# Patient Record
Sex: Female | Born: 1945 | Race: White | Hispanic: No | Marital: Married | Smoking: Former smoker
Health system: Southern US, Community
[De-identification: ages and names within clinical notes are randomized; demographics above are authoritative.]

## PROBLEM LIST (undated history)

## (undated) DIAGNOSIS — Z950 Presence of cardiac pacemaker: Secondary | ICD-10-CM

## (undated) DIAGNOSIS — I495 Sick sinus syndrome: Secondary | ICD-10-CM

## (undated) DIAGNOSIS — C50919 Malignant neoplasm of unspecified site of unspecified female breast: Secondary | ICD-10-CM

## (undated) DIAGNOSIS — I499 Cardiac arrhythmia, unspecified: Secondary | ICD-10-CM

## (undated) DIAGNOSIS — J849 Interstitial pulmonary disease, unspecified: Secondary | ICD-10-CM

## (undated) DIAGNOSIS — C801 Malignant (primary) neoplasm, unspecified: Secondary | ICD-10-CM

## (undated) DIAGNOSIS — Z973 Presence of spectacles and contact lenses: Secondary | ICD-10-CM

## (undated) DIAGNOSIS — I519 Heart disease, unspecified: Secondary | ICD-10-CM

## (undated) DIAGNOSIS — H269 Unspecified cataract: Secondary | ICD-10-CM

## (undated) DIAGNOSIS — J45909 Unspecified asthma, uncomplicated: Secondary | ICD-10-CM

## (undated) DIAGNOSIS — I10 Essential (primary) hypertension: Secondary | ICD-10-CM

## (undated) DIAGNOSIS — M109 Gout, unspecified: Secondary | ICD-10-CM

## (undated) DIAGNOSIS — I493 Ventricular premature depolarization: Secondary | ICD-10-CM

## (undated) DIAGNOSIS — D649 Anemia, unspecified: Secondary | ICD-10-CM

## (undated) DIAGNOSIS — K219 Gastro-esophageal reflux disease without esophagitis: Secondary | ICD-10-CM

## (undated) DIAGNOSIS — Z789 Other specified health status: Secondary | ICD-10-CM

## (undated) DIAGNOSIS — J189 Pneumonia, unspecified organism: Secondary | ICD-10-CM

## (undated) DIAGNOSIS — M1990 Unspecified osteoarthritis, unspecified site: Secondary | ICD-10-CM

## (undated) DIAGNOSIS — M8580 Other specified disorders of bone density and structure, unspecified site: Secondary | ICD-10-CM

## (undated) HISTORY — DX: Other specified health status: Z78.9

## (undated) HISTORY — DX: Other specified disorders of bone density and structure, unspecified site: M85.80

## (undated) HISTORY — DX: Ventricular premature depolarization: I49.3

## (undated) HISTORY — DX: Heart disease, unspecified: I51.9

## (undated) HISTORY — PX: TUBAL LIGATION: SHX77

## (undated) HISTORY — PX: WISDOM TOOTH EXTRACTION: SHX21

## (undated) HISTORY — PX: EYE SURGERY: SHX253

## (undated) HISTORY — DX: Pneumonia, unspecified organism: J18.9

## (undated) HISTORY — PX: PACEMAKER INSERTION: SHX728

## (undated) HISTORY — PX: HYSTEROSCOPY: SHX211

## (undated) HISTORY — DX: Interstitial pulmonary disease, unspecified: J84.9

## (undated) HISTORY — DX: Sick sinus syndrome: I49.5

## (undated) HISTORY — PX: VAGINAL HYSTERECTOMY: SUR661

## (undated) HISTORY — DX: Essential (primary) hypertension: I10

## (undated) HISTORY — DX: Presence of cardiac pacemaker: Z95.0

## (undated) HISTORY — PX: CORONARY ANGIOPLASTY: SHX604

## (undated) HISTORY — PX: DILATION AND CURETTAGE OF UTERUS: SHX78

## (undated) HISTORY — PX: GALLBLADDER SURGERY: SHX652

## (undated) HISTORY — PX: TONSILLECTOMY: SUR1361

---

## 1998-08-08 ENCOUNTER — Other Ambulatory Visit: Admission: RE | Admit: 1998-08-08 | Discharge: 1998-08-08 | Payer: Self-pay | Admitting: Obstetrics and Gynecology

## 1999-10-02 ENCOUNTER — Other Ambulatory Visit: Admission: RE | Admit: 1999-10-02 | Discharge: 1999-10-02 | Payer: Self-pay | Admitting: Obstetrics and Gynecology

## 2000-10-01 ENCOUNTER — Other Ambulatory Visit: Admission: RE | Admit: 2000-10-01 | Discharge: 2000-10-01 | Payer: Self-pay | Admitting: Obstetrics and Gynecology

## 2001-04-22 ENCOUNTER — Encounter: Payer: Self-pay | Admitting: Emergency Medicine

## 2001-04-22 ENCOUNTER — Inpatient Hospital Stay (HOSPITAL_COMMUNITY): Admission: EM | Admit: 2001-04-22 | Discharge: 2001-04-26 | Payer: Self-pay | Admitting: Emergency Medicine

## 2001-10-05 ENCOUNTER — Other Ambulatory Visit: Admission: RE | Admit: 2001-10-05 | Discharge: 2001-10-05 | Payer: Self-pay | Admitting: Obstetrics and Gynecology

## 2002-07-31 ENCOUNTER — Encounter: Payer: Self-pay | Admitting: General Surgery

## 2002-08-02 ENCOUNTER — Ambulatory Visit (HOSPITAL_COMMUNITY): Admission: RE | Admit: 2002-08-02 | Discharge: 2002-08-03 | Payer: Self-pay | Admitting: General Surgery

## 2002-08-02 ENCOUNTER — Encounter: Payer: Self-pay | Admitting: General Surgery

## 2002-08-02 ENCOUNTER — Encounter (INDEPENDENT_AMBULATORY_CARE_PROVIDER_SITE_OTHER): Payer: Self-pay | Admitting: Specialist

## 2002-10-25 ENCOUNTER — Other Ambulatory Visit: Admission: RE | Admit: 2002-10-25 | Discharge: 2002-10-25 | Payer: Self-pay | Admitting: Obstetrics and Gynecology

## 2003-11-05 ENCOUNTER — Other Ambulatory Visit: Admission: RE | Admit: 2003-11-05 | Discharge: 2003-11-05 | Payer: Self-pay | Admitting: Obstetrics and Gynecology

## 2004-11-10 ENCOUNTER — Other Ambulatory Visit: Admission: RE | Admit: 2004-11-10 | Discharge: 2004-11-10 | Payer: Self-pay | Admitting: Obstetrics and Gynecology

## 2005-12-14 ENCOUNTER — Other Ambulatory Visit: Admission: RE | Admit: 2005-12-14 | Discharge: 2005-12-14 | Payer: Self-pay | Admitting: Obstetrics and Gynecology

## 2005-12-30 ENCOUNTER — Encounter: Admission: RE | Admit: 2005-12-30 | Discharge: 2005-12-30 | Payer: Self-pay | Admitting: Obstetrics and Gynecology

## 2007-01-18 ENCOUNTER — Encounter: Admission: RE | Admit: 2007-01-18 | Discharge: 2007-01-18 | Payer: Self-pay | Admitting: Obstetrics and Gynecology

## 2007-01-19 ENCOUNTER — Other Ambulatory Visit: Admission: RE | Admit: 2007-01-19 | Discharge: 2007-01-19 | Payer: Self-pay | Admitting: Obstetrics and Gynecology

## 2008-04-19 ENCOUNTER — Other Ambulatory Visit: Admission: RE | Admit: 2008-04-19 | Discharge: 2008-04-19 | Payer: Self-pay | Admitting: Obstetrics and Gynecology

## 2008-04-20 ENCOUNTER — Encounter: Admission: RE | Admit: 2008-04-20 | Discharge: 2008-04-20 | Payer: Self-pay | Admitting: Obstetrics and Gynecology

## 2008-05-01 ENCOUNTER — Encounter: Admission: RE | Admit: 2008-05-01 | Discharge: 2008-05-01 | Payer: Self-pay | Admitting: Obstetrics and Gynecology

## 2009-02-22 ENCOUNTER — Ambulatory Visit (HOSPITAL_COMMUNITY): Admission: RE | Admit: 2009-02-22 | Discharge: 2009-02-22 | Payer: Self-pay | Admitting: Cardiology

## 2009-07-09 ENCOUNTER — Encounter: Admission: RE | Admit: 2009-07-09 | Discharge: 2009-07-09 | Payer: Self-pay | Admitting: Obstetrics and Gynecology

## 2009-07-15 ENCOUNTER — Other Ambulatory Visit: Admission: RE | Admit: 2009-07-15 | Discharge: 2009-07-15 | Payer: Self-pay | Admitting: Obstetrics and Gynecology

## 2009-07-15 ENCOUNTER — Ambulatory Visit: Payer: Self-pay | Admitting: Obstetrics and Gynecology

## 2009-07-15 ENCOUNTER — Encounter: Payer: Self-pay | Admitting: Obstetrics and Gynecology

## 2010-07-10 ENCOUNTER — Encounter: Admission: RE | Admit: 2010-07-10 | Discharge: 2010-07-10 | Payer: Self-pay | Admitting: Obstetrics and Gynecology

## 2010-07-12 ENCOUNTER — Encounter: Payer: Self-pay | Admitting: Internal Medicine

## 2010-07-16 ENCOUNTER — Ambulatory Visit: Payer: Self-pay | Admitting: Cardiology

## 2010-08-01 ENCOUNTER — Ambulatory Visit: Payer: Self-pay | Admitting: Obstetrics and Gynecology

## 2010-08-26 ENCOUNTER — Ambulatory Visit: Payer: Self-pay | Admitting: Obstetrics and Gynecology

## 2010-10-16 ENCOUNTER — Ambulatory Visit: Payer: Self-pay | Admitting: Internal Medicine

## 2010-11-05 ENCOUNTER — Encounter (INDEPENDENT_AMBULATORY_CARE_PROVIDER_SITE_OTHER): Payer: Self-pay | Admitting: *Deleted

## 2010-11-25 NOTE — Miscellaneous (Signed)
Summary: Device preload  Clinical Lists Changes  Observations: Added new observation of PPM INDICATN: Sick sinus syndrome (07/12/2010 14:25) Added new observation of MAGNET RTE: BOL 85 ERI 65 (07/12/2010 14:25) Added new observation of PPMLEADSTAT2: active (07/12/2010 14:25) Added new observation of PPMLEADSER2: 161096  (07/12/2010 14:25) Added new observation of PPMLEADMOD2: 4470  (07/12/2010 14:25) Added new observation of PPMLEADDOI2: 04/25/2001  (07/12/2010 14:25) Added new observation of PPMLEADLOC2: RV  (07/12/2010 14:25) Added new observation of PPMLEADSTAT1: active  (07/12/2010 14:25) Added new observation of PPMLEADSER1: 045409  (07/12/2010 14:25) Added new observation of PPMLEADMOD1: 4469  (07/12/2010 14:25) Added new observation of PPMLEADDOI1: 04/25/2001  (07/12/2010 14:25) Added new observation of PPMLEADLOC1: RA  (07/12/2010 14:25) Added new observation of PPM IMP MD: Roger Shelter, MD  (07/12/2010 14:25) Added new observation of PPM DOI: 07/03/2009  (07/12/2010 14:25) Added new observation of PPM SERL#: [id number]  (07/12/2010 14:25) Added new observation of PPM MODL#: [id number]  (07/12/2010 95:62) Added new observation of PACEMAKERMFG: Medtronic  (07/12/2010 14:25) Added new observation of PPM REFER MD: Roger Shelter, MD  (07/12/2010 14:25) Added new observation of PACEMAKER MD: Sherryl Manges, MD  (07/12/2010 14:25)      PPM Specifications Following MD:  Sherryl Manges, MD     Referring MD:  Roger Shelter, MD PPM Vendor:  Medtronic     PPM Model Number:  ADDRL1     PPM Serial Number:  [id number] PPM DOI:  07/03/2009     PPM Implanting MD:  Roger Shelter, MD  Lead 1    Location: RA     DOI: 04/25/2001     Model #: 6962     Serial #: [id number]     Status: active Lead 2    Location: RV     DOI: 04/25/2001     Model #: 4470     Serial #: [id number]     Status: active  Magnet Response Rate:  BOL 85 ERI 65  Indications:  Sick sinus syndrome

## 2010-11-27 NOTE — Cardiovascular Report (Signed)
Summary: Office Visit Remote   Office Visit Remote   Imported By: Roderic Ovens 11/07/2010 11:53:59  _____________________________________________________________________  External Attachment:    Type:   Image     Comment:   External Document

## 2010-11-27 NOTE — Letter (Signed)
Summary: Remote Device Check  [facility], Main Office  [address]   [place], [place] [address]   Phone: [phone]  Fax: [phone]     November 05, 2010 MRN: [id number]   Judy Chapman [address] [place], [place]  [address]   Dear Ms. NEWMANN,   Your remote transmission was recieved and reviewed by your physician.  All diagnostics were within normal limits for you.  __X____Your next office visit is scheduled for:  01-15-11 @ 1115 with Dr Graciela Husbands.                                 Sincerely,  Vella Kohler

## 2011-01-14 ENCOUNTER — Encounter: Payer: Self-pay | Admitting: *Deleted

## 2011-01-15 ENCOUNTER — Encounter: Payer: Self-pay | Admitting: Internal Medicine

## 2011-01-27 ENCOUNTER — Telehealth: Payer: Self-pay | Admitting: Internal Medicine

## 2011-01-27 ENCOUNTER — Ambulatory Visit (INDEPENDENT_AMBULATORY_CARE_PROVIDER_SITE_OTHER): Payer: Medicare Other | Admitting: Internal Medicine

## 2011-01-27 ENCOUNTER — Encounter: Payer: Self-pay | Admitting: Internal Medicine

## 2011-01-27 VITALS — BP 118/80 | HR 73 | Ht 66.0 in | Wt 178.0 lb

## 2011-01-27 DIAGNOSIS — R001 Bradycardia, unspecified: Secondary | ICD-10-CM

## 2011-01-27 DIAGNOSIS — I498 Other specified cardiac arrhythmias: Secondary | ICD-10-CM

## 2011-01-27 NOTE — Patient Instructions (Addendum)
Remote monitoring is used to monitor your Pacemaker of ICD from home. This monitoring reduces the number of office visits required to check your device to one time per year. It allows us to keep an eye on the functioning of your device to ensure it is working properly. You are scheduled for a device check from home on April 30, 2011. You may send your transmission at any time that day. If you have a wireless device, the transmission will be sent automatically. After your physician reviews your transmission, you will receive a postcard with your next transmission date. Your physician recommends that you schedule a follow-up appointment in: YEAR WITH DR KLEIN Your physician recommends that you continue on your current medications as directed. Please refer to the Current Medication list given to you today. 

## 2011-01-27 NOTE — Telephone Encounter (Signed)
Pt has question re meds. °

## 2011-01-27 NOTE — Telephone Encounter (Signed)
PT WAS INQUIRING  RE CHECK OUT SHEET HAD LISTED TWICE CALCIUM ONE WAS DISCONTINUED PT TO TAKE THE 600 MG BID.PT VERBALIZED UNDERSTANDING.Marland Kitchen/CY

## 2011-01-27 NOTE — Progress Notes (Signed)
HPI: Judy Chapman is a 65 y.o. 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 He was initially implanted in 2002 with device generator replacement March 2010. This was done by Dr. Maylon Cos. She is seen today to establish pacemaker followup.  She also has a history of hypertension  The patient denies chest pain, shortness of breath, nocturnal dyspnea, orthopnea or peripheral edema.  There have been no palpitations, lightheadedness or syncope.    Current Outpatient Prescriptions  Medication Sig Dispense Refill  . aspirin 81 MG tablet Take 81 mg by mouth daily.        . calcium carbonate (OS-CAL) 600 MG TABS Take 600 mg by mouth 2 (two) times daily with a meal.        . citalopram (CELEXA) 20 MG tablet Take 20 mg by mouth daily.        . Estradiol (ESTRACE PO) Take by mouth daily.        . hydrochlorothiazide (,MICROZIDE/HYDRODIURIL,) 12.5 MG capsule Take 12.5 mg by mouth daily.        . ramipril (ALTACE) 10 MG capsule Take 10 mg by mouth daily.        Marland Kitchen DISCONTD: calcium carbonate 200 MG capsule Take 250 mg by mouth as directed.         No Known Allergies  Past Medical History  Diagnosis Date  . PVC (premature ventricular contraction)   . Dyslipidemia   . HTN (hypertension)     Past Surgical History  Procedure Date  . Coronary angioplasty   . Pacemaker insertion     No family history on file.  History   Social History  . Marital Status: Married    Spouse Name: N/A    Number of Children: N/A  . Years of Education: N/A   Occupational History  . Not on file.   Social History Main Topics  . Smoking status: Former Smoker    Types: Cigarettes    Quit date: 10/26/2005  . Smokeless tobacco: Never Used  . Alcohol Use: 0.5 - 1.0 oz/week    1-2 drink(s) per week  . Drug Use: No  . Sexually Active: Not on file   Other Topics Concern  . Not on file   Social History Narrative  . No narrative on file    Fourteen point review of systems was  negative except as noted in HPI and PMH   PHYSICAL EXAMINATION  Blood pressure 118/80, pulse 73, height 5\' 6"  (1.676 m), weight 178 lb (80.74 kg).   Well developed and nourished odler caucasian female in no acute distress HENT normal Neck supple with JVP-flat Carotids brisk and full without bruits Back without scoliosis or kyphosis Clear Regular rate and rhythm, no murmurs or gallops Abd-soft with active BS without hepatomegaly or midline pulsation Femoral pulses 2+ distal pulses intact No Clubbing cyanosis edema Skin-warm and dry LN-neg submandibular and supraclavicular A & Oriented CN 3-12 normal  Grossly normal sensory and motor function Affect engaging  ECG today demonstrates sinus rhythm at 73 Ann 12.15 5.08/0.39 Axis is -17 Low-voltage

## 2011-02-04 NOTE — Progress Notes (Signed)
Addended by: Judithe Modest on: 02/04/2011 04:45 PM   Modules accepted: Orders

## 2011-03-10 NOTE — Cardiovascular Report (Signed)
NAME:  Judy Chapman, Judy Chapman                ACCOUNT NO.:  [id number]   MEDICAL RECORD NO.:  [id number]          PATIENT TYPE:  OIB   LOCATION:  2899                         FACILITY:  [facility]   PHYSICIAN:  Colleen Can. Deborah Chalk, M.D.DATE OF BIRTH:  09-11-1946   DATE OF PROCEDURE:  02/22/2009  DATE OF DISCHARGE:  02/22/2009                            CARDIAC CATHETERIZATION   PROCEDURE:  Pacemaker change out because of end-of-life parameters for  previous pulse generator implanted for sick sinus syndrome.   PROCEDURE:  Right subclavicular area was prepped and draped.  The old  pulse generator was located using sharp and Bovie dissection.  The new  device was a Medtronic dual-chamber catheter, model S6379888, serial  number [id number], implanted on April 25, 2001.   The leads were interrogated.  The ventricular lead is a Guidant bipolar  screw-in lead, model 4470, serial number [id number], implanted on April 25, 2001.  R waves measured 6.2 mV.  Ventricular lead impedance was 474  ohms.  Ventricular capture threshold was 0.7 volts with a current of 1.7  mA at 0.5 milliseconds pulse width.   The atrial lead was a Guidant bipolar screw-in lead, model 4469, serial  number [id number], implanted on April 25, 2001.  P-waves measured 2.2 mV.  The  atrial lead impedance was 397 ohms.  Atrial capture threshold was 0.5  volts with a current of 1.2 mA at 0.5 milliseconds pulse width.   The pocket was revised and enlarged slightly to encompass the larger  generator.  The Medtronic Adapta L ADDRL1, serial number [id number], was  connected to the leads and placed in the pocket.  The pocket was  flushed, but before and after the pacemaker was in place with gentamicin  solution.  The pulse generator was not sutured in place.  The wound was  subsequently closed with 2-0 and subsequently 4-0 Vicryl.  Steri-Strips  were applied.  The patient tolerated the procedure well.  She received  Versed 2 mg IV during the procedure and  vancomycin as the preoperative  antibiotic.      Colleen Can. Deborah Chalk, M.D.  Electronically Signed     SNT/MEDQ  D:  02/22/2009  T:  02/22/2009  Job:  [id number]

## 2011-03-10 NOTE — H&P (Signed)
NAME:  Judy Chapman, Judy Chapman                ACCOUNT NO.:  [id number]   MEDICAL RECORD NO.:  [id number]          PATIENT TYPE:  OIB   LOCATION:  NA                           FACILITY:  [facility]   PHYSICIAN:  Colleen Can. Deborah Chalk, M.D.DATE OF BIRTH:  [birth year]   DATE OF ADMISSION:  11/24/2008  DATE OF DISCHARGE:                              HISTORY & PHYSICAL   CHIEF COMPLAINT:  None.   HISTORY OF PRESENT ILLNESS:  Judy Chapman is a very pleasant 65 year old white  female who is referred for elective generator replacement.  She has had  a pacemaker implanted since July 2002, because of profound sick sinus  syndrome with long periods of sinus arrest and functionally asystole.  She has reached elective replacement indicators as of January 21, 2009.  Clinically, she has done well with no complaints of chest pain,  shortness of breath, lightheadedness or dizziness.  She has had no frank  syncope.   PAST MEDICAL HISTORY:  1. Implantation of a dual-chamber pacemaker in July 2002, due to      profound sick sinus syndrome.  2. Status post hysterectomy in 1986.  3. Previous benign breast lumpectomy in the 1980s.  4. History of a D and C.  5. Tobacco abuse.  6. Hypertension.  7. Mild dyslipidemia.  8. History of PVCs.   ALLERGIES:  MACRODANTIN.   CURRENT MEDICINES:  1. Altace 10 mg a day.  2. Estrace daily.  3. Calcium daily.  4. Celexa 20 mg a day.  5. Hydrochlorothiazide 12.5 mg a day.   FAMILY HISTORY:  Father died at 75 from alcohol related complications.   SOCIAL HISTORY:  She is a Chartered loss adjuster.  She is married.  She has two  daughters.  She does have tobacco use and has social alcohol use.   REVIEW OF SYSTEMS:  Basically as noted above.  She has had no recent  fever, flu or cough.  She has been under a little more stress in regards  to her employment situation.  She has had no chest pain or shortness of  breath.  She is not lightheaded or dizzy.  She has had no frank syncope  and all other  review of systems are negative.   PHYSICAL EXAMINATION:  GENERAL:  She is a very pleasant white female in  no acute distress.  She is pleasant and conversive and follows commands.  VITAL SIGNS:  Her weight 181.2, blood pressure 138/80 sitting, 140/80  standing, heart rate 70, respirations 18.  She is afebrile.  SKIN: Warm and Dry. Color: Suntanned.  HEENT:  Normocephalic, atraumatic.  Pupils are equal and reactive.  Sclera is normal.  Conjunctiva is clear.  NECK:  Supple.  No masses.  No JVD.  LUNGS:  Clear to auscultation.  CARDIAC:  Shows a regular rhythm.  Pacemaker is in the right upper  chest.  ABDOMEN:  Obese, yet soft.  Positive bowel sounds.  EXTREMITIES:  Without edema.  MUSCULOSKELETAL:  Shows the strength to be symmetric.  Gait and range of  motion are intact.  NEUROLOGIC:  Shows no gross focal deficits.   Pertinent  labs are pending.   OVERALL IMPRESSION:  1. End-of-life pulse generator.  2. Remote history of syncope in the setting of profound sick sinus      syndrome.  3. Hypertension.  4. Mild dyslipidemia.  5. Obesity.   PLAN:  We will proceed on with generator replacement.  The procedure has  been reviewed in full detail and she is willing to proceed on Friday,  February 22, 2009.      Judy Chapman, N.P.      Colleen Can. Deborah Chalk, M.D.  Electronically Signed    LC/MEDQ  D:  02/21/2009  T:  02/21/2009  Job:  [id number]

## 2011-03-13 NOTE — Op Note (Signed)
NAME:  Judy Chapman, Judy Chapman                          ACCOUNT NO.:  [id number]   MEDICAL RECORD NO.:  [id number]                   PATIENT TYPE:  OIB   LOCATION:  2550                                 FACILITY:  [facility]   PHYSICIAN:  Gita Kudo, M.D.              DATE OF BIRTH:  1945/12/26   DATE OF PROCEDURE:  08/02/2002  DATE OF DISCHARGE:                                 OPERATIVE REPORT   PREOPERATIVE DIAGNOSES:  Gallstones.   POSTOPERATIVE DIAGNOSES:  Gallstones.   OPERATION PERFORMED:  Laparoscopic cholecystectomy with intraoperative  cholangiogram.   SURGEON:  Gita Kudo, M.D.   ASSISTANT:  Donnie Coffin. Samuella Cota, M.D.   ANESTHESIA:  General endotracheal.   INDICATIONS FOR PROCEDURE:  The patient is a 65 year old female with a  history of abdominal pain and gallstones comes in for elective surgery.  She  has ultrasound showing stones and her liver function studies are normal.  Of  note is the fact she has a pacemaker in place.   OPERATIVE FINDINGS:  The pacemaker functioned well during the procedure and  there were no complications.  The gallbladder was packed with stones and the  cholangiogram looked normal.  The duct and artery were normal in size and  anatomy.   DESCRIPTION OF PROCEDURE:  Under satisfactory general endotracheal  anesthesia, having received 1.0 gm Ancef preop, the patient's abdomen was  prepped and draped in a standard fashion.  Her previous abdominal umbilical  incision was opened in the midline and carried into the peritoneum.  Controlled with a figure-of-eight 0 Vicryl suture and operating Hasson port  inserted, with CO2 pneumoperitoneum established and ports secured.  Under  direct vision, through Marcaine infiltrated incisions, two number 5 ports  placed laterally and a second #10 medially.  With graspers through the  lateral port giving excellent exposure we identified the cystic duct  gallbladder junction after taking down adhesions and when  this and the  cystic artery were positively identified by circumferential dissection, the  artery was controlled with multiple clips and divided and a single clip  placed at the gallbladder cystic duct  junction.  A percutaneously placed  cholangiogram catheter was placed into the duct and good x-rays taken.  The  catheter was withdrawn and the duct controlled with multiple clips and  divided.  The gallbladder was then removed from below upward using  coagulating spatula for hemostasis and dissection.  After the gallbladder  was removed, the liver bed was checked for hemostasis, made dry by cautery,  lavaged with saline and suctioned.   Camera moved to the upper port and through the lower port a large grasper  placed and used to extract the gallbladder intact, without spillage or  complications.  Following this, the operative site was again checked, the  abdomen lavaged and suctioned dry and then CO2 and ports released under  vision.  The midline was then closed  with a previous figure-of-eight as well as a second interrupted 0 Prolene  suture and then subcu approximated with 4-0 Vicryl and skin edges with Steri-  Strips.  Sterile absorbent dressings were then applied and the patient went  to the recovery room from the operating room in good condition without  complication.                                                 Gita Kudo, M.D.    MRL/MEDQ  D:  08/02/2002  T:  08/02/2002  Job:  [id number]   cc:   Colleen Can. Deborah Chalk, M.D.  [address]  [place]  [place] [address]  Fax: [phone]   Tammy R. Collins [place], M.D.

## 2011-03-13 NOTE — H&P (Signed)
[facility]  Patient:    Judy Chapman, Judy Chapman                       MRN: [id number] Adm. Date:  [id number] Attending:  Jetty Duhamel T                         History and Physical  DATE OF BIRTH:  [birth year]  PRIMARY CARE PHYSICIAN:  [facility]  CHIEF COMPLAINT:  Syncope.  HISTORY OF PRESENT ILLNESS:  [name] is a 65 year old female with medical history as listed below who presents to the hospital for evaluation after a syncopal spell at work. The patient states that she was at work, began to feel hot, and began to [employer]. At which time, she sat down into a chair and then feels that she lost of consciousness for a few seconds. When she awoke, she was on the floor apparently having rolled out of the chair with minor abrasions to the right eyebrow and periorbital area. She states that she has had similar episodes three additional times in the week prior to this admission, and an outpatient evaluation was initiated by her primary physician at [facility]. She states that all episodes have been very similar and that they began approximately three days prior to this admission while attending a movie. She sat down in the movie theater. At which time, she began to feel hot and perspired and then "went to sleep" for approximately 5 seconds. This was witnessed by a friend accompanying her, and the patient reports that when she awoke she was completely alert and had no deficits whatsoever. It happened again the night returning home from the movie theater with the same symptom complex, and husband witnessed it to last about 5 seconds. One more repeat episode occurred two days prior to this admission with the final episode described above. The patient states that when she wakes up she feels completely normal. She specifically denies palpitations or chest pain. She has no shortness of breath. There has been no dysuria or  polyuria or hematuria. There has been no change in appetite or sudden weight loss, and there has been no focal neurologic deficit. The patient does state that on some of these episodes she has been severely nauseated and has vomited once or twice during these episodes. The patient states that prior to this recent occurrence she has not had similar symptoms and no one in her family has had similar symptoms. She has not had urinary or bowel incontinence to any degree. This patient specifically denies recent head trauma or acute change in visual acuity. There has been no slurring of speech and no dysphagia.  PAST SURGICAL HISTORY: 1. Status post hysterectomy with bilateral ovaries intact, 1986. 2. Status post benign breast lumpectomy in 1980s by Milus Mallick, M.D. 3. Status post D&C x 1.  PAST MEDICAL HISTORY: 1. History of mildly elevated LFTs presumed secondary to Voltaren per patient. 2. Tobacco abuse in the amount of one to two cigarettes per day. 3. Use of a "natural diet supplement" over-the-counter.  ALLERGIES:  MACRODANTIN.  PRESCRIPTION MEDICATIONS: 1. Hormone replacement therapy - Estrace - x 5 years. 2. Prevacid - initiated this week.  FAMILY HISTORY:  Mother is alive and has no contributory medical problems. Father is deceased at age 52 from alcohol-related complications. The patient has one brother who is healthy.  SOCIAL HISTORY:  The  patient lives in [place]. She is a Engineer, site and teaches third grade. She is married. She has two daughters who are healthy. She smokes two to three cigarettes per day, off and on. She drinks one to two glasses of wine per night with occasional increased use at parties and such.  REVIEW OF SYSTEMS:  Complete review of systems entirely negative, with the exception of those noted in history of present illness.  PHYSICAL EXAMINATION:  GENERAL:  Well-appearing, 65 year old Caucasian female who appears stated age and is in no  acute respiratory distress.  VITAL SIGNS:  Temperature 97, blood pressure 144/78, heart rate 85, respiratory rate 20, O2 saturation 98% on room air, blood pressure supine 122/66 with heart rate of 78, blood pressure standing 137/81 with heart rate 85.  HEENT:  Normocephalic, atraumatic. Pupils equal, round, and reactive to light and accommodation. Extraocular muscles intact bilaterally. Small cutaneous abrasion consistent with carpet burn on lateral aspect of right eyebrow and periorbital area without pronounced bleeding and no foreign bodies appreciated. OC/OP clear.  NECK:  No lymphadenopathy or thyromegaly.  CARDIOVASCULAR:  Regular rate and rhythm with occasional PVCs appreciable without appreciable murmur.  LUNGS:  Clear to auscultation bilaterally without wheeze or rhonchi.  ABDOMEN:  Nontender, nondistended. Soft. Bowel sounds present. No hepatosplenomegaly. No rebound. No ascites.  EXTREMITIES:  No clubbing, cyanosis, or edema bilateral lower extremities with 2+ dorsalis pedis pulses bilaterally.  NEUROLOGICAL:  Revealed 5/5 strength of bilateral upper and lower extremities. Intact sensation to touch throughout. 2+ DTRs throughout. No Babinski. Alert and oriented x 4. Cranial nerves 2 through 12 intact bilaterally.  ADMISSION LABORATORY DATA:  Hemoglobin 13.6, MCV 92, platelets 249, white count 7.7, absolute neutrophil count 5.6. Sodium 139, potassium 3.6, chloride 106, CO2 29, BUN 17, creatinine 0.6, glucose 109, AST 25, ALT 34, alkaline phosphatase 62, total bilirubin 0.5, total protein 6.6, albumin 3.8.  Chest x-ray revealing no acute disease with clear lung fields.  EKG revealing normal sinus rhythm with a rate of 83 beats per minute. No acute ST or T wave changes with occasional PVCs.  CT scan of the head is pending.  IMPRESSION AND PLAN: 1. Syncope. The potential etiologies for this patients syncope are endless.    Many of her symptoms are consistent with a  vasovagal-type syndrome.    However, the patient denies the expected response to the usual triggers for     people that are prone to vasovagal syndrome. She is not orthostatic, and    there is no evidence of dehydration on physical exam or laboratory exam. I    am also somewhat suspicious of a "dietary supplement" that the patient uses    that she states helps suppress her appetite and gives her an extra lift. I    am concerned that this may contain large amounts of ephedrine, and this    could explain the patients PVCs, as well as some of her syncopal symptoms.    I have requested that the patients husband bring this in. We will, given    her PVCs, request an echo to assure that the cardiac function is normal and    that there is no evidence of wall motion abnormalities or valvular    disorders. CT of the head is pending to rule out the rare possibility of    intracranial mass. Apparently, TSH has been drawn recently at [facility] and was reported to be normal, but I am requesting these  records to confirm this. The patient will be admitted for telemetry    monitoring overnight and hopefully for acquisition of a 2-D echocardiogram.    I have warned the patient that the etiologies of syncope are large and that    at times we are unable to identify its exact source prior to discharge but    have assured her that we will do all that we can to rule out serious    life-threatening etiologies. 2. Tobacco abuse. I counseled the patient extensively on the need to quit    altogether. Even though the patients smoking habit is very limited, I have    explained to her the benefits of discontinuing tobacco abuse altogether,    and she agrees. She voiced understanding and a desire to attempt this after    discharge. 3. Questionable history of elevated liver function tests with questionable    elevated iron. This history is somewhat concerning to me for less common    syndrome,  such as hemachromatosis. Prior to obtaining follow-up labs here    in the hospital, I will ask for her baseline labs from the office to assure    that nothing such as this could explain the patients current symptoms. DD:  04/22/01 TD:  04/22/01 Job: [id number]

## 2011-03-13 NOTE — Discharge Summary (Signed)
[place]. [facility]  Patient:    Judy Chapman, Judy Chapman                       MRN: [id number] Adm. Date:  [id number] Disc. Date: [id number] Attending:  Lonia Blood CC:         [facility]   Discharge Summary  DISCHARGE DIAGNOSES: 1. Sick sinus syndrome with syncope. 2. Insertion of a dual-chamber pulse generator.  PROCEDURES:  Insertion of a temporary pacemaker and insertion of a dual-chamber pulse generator under fluoroscopy.  HISTORY OF PRESENT ILLNESS:  Ms. Wiehe was in her usual state of health.  She had a syncopal episode with minor injuries to right eye.  She had three similar episodes without injury over the three weeks prior to that.  Because of these episodes, she is admitted.  She has no history of tick exposure to suggest Limes disease, although she has a relative who has had Limes disease.  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  She smokes about one to two cigarettes per day and has used natural diet supplements over-the-counter.  MEDICATIONS: 1. Hormone replacement therapy with Estrace over the last five years. 2. History of Prevacid.  PHYSICAL EXAMINATION:  Basically unremarkable.  HOSPITAL COURSE:  She was admitted to the hospital and was quickly noted to have episodes of sinus arrest.  She was initially treated with Atropine and seemingly had satisfactory control of that.  However, on April 24, 2001, she had another episode in spite of Atropine with prolonged asystole greater than five to six seconds with near syncope.  A temporary pacemaker was then placed, by Dr. Deborah Chalk.  On April 25, 2001, she had a dual-chamber pacemaker implanted. The pacemaker was a Medtronic Kappa KDR 901, serial No. [id number].  She had a guidant atrial lead Model [id number], serial No. [id number].  She had a guidant ventricular lead Model [id number].  The lead measurements showed R waves of 7.5 mV.  Ventricular  capsule threshold of 0.7 volts at 0.5 msec with a current of 1.4, MA current ventricular lead impedance was 1614 olms.  T waves were 4.1 mV with atrial capture, 1.2 volts at 0.5 msec with a current of 1.3, MA atrial lead impedance of 810 olms.  LABORATORY DATA AND X-RAY FINDINGS:  Pertinent laboratory includes potassium of 2.8.  Chemistries were normal except for an albumin of 3.3.  Hematocrit was 39, white count 10,400.  Follow up albumin was within normal limits.  Iron and TIBC were all within normal limits.  Ferritin was normal.  CK-MBs were basically negative.  Limes disease titer was pending at the time of discharge.  Resting EKG was normal except for occasional PVCs.  FOLLOWUP:  She will be seen in followup by Dr. Deborah Chalk for management of her pacemaker.  Pacemaker will be reprogrammed in three months.  Will need to check on Lime titers at the time of the return.  She will need a 2D echocardiogram subsequent in followup. DD:  04/26/01 TD:  04/26/01 Job: [id number]

## 2011-03-13 NOTE — Cardiovascular Report (Signed)
[place]. [facility]  Patient:    LONEY, PETO Visit Number: [id number] MRN: [id number]          Service Type: MED Location: [facility] [id number] Attending Physician:  Jetty Duhamel T Proc. Date: 04/25/01 Admit Date:  04/22/2001   CC:         Jetty Duhamel, M.D.  Pearla Dubonnet, M.D.   Cardiac Catheterization  PROCEDURE:  Implantation of a dual-chamber pacemaking system with atrial and ventricular leads under fluoroscopy.  INDICATIONS:  Profound sick sinus syndrome with long periods of sinus arrest and functioning asystole.  DESCRIPTION OF PROCEDURE:  The patients right subclavicular area was prepped and draped.  Subcuticular pocket was created to the prepectoral fascia. Punctures were made in the right subclavian vein.  They were quite difficult, as the subclavian vein rode somewhat high, and the punctures were made over top of the first rib, but there was a smooth insertion without any obvious kinking.  Using 7 French Cook introducers, the ventricular and atrial leads were introduced.  The ventricular lead was a Guidant Model 4470 bipolar screw in type active fixational lead.  The following thresholds showed R waves of 7.5 mV, ventricular capture of .7 volts at .5 milliseconds with a current of 1.4 MA.  Ventricular lead impendance was 614 ohms.  The atrial lead was a Guidant 4469 bipolar screw in type lead, serial number [id number].  The following thresholds recorded P waves were 4.1 mV, atrial capture threshold of 1.2 volts, .5 milliseconds with a current of 1.3, MA current atrial lead impendance was 810 ohms.  It was difficult to find satisfactory thresholds on both the atrial and ventricular chambers.  Multiple positions were tried in both chambers, and we felt we had satisfactory fluoroscopic as well as electrophysiologic connections.  The leads were connected to Medtronic KDR901 dual-chamber rate responsive pacemaker serial number  [id number].  The unit was sutured in place.  The wound was flushed with kanamycin solution.  The old temporary lead was removed (two days old).  The wound was closed with 2-0 and subsequently 5-0 Dexon, and Steri-Strips were applied. Attending Physician:  Jetty Duhamel T DD:  04/25/01 TD:  04/25/01 Job: [id number]

## 2011-03-26 ENCOUNTER — Ambulatory Visit (INDEPENDENT_AMBULATORY_CARE_PROVIDER_SITE_OTHER): Payer: Medicare Other | Admitting: Cardiology

## 2011-03-26 ENCOUNTER — Encounter: Payer: Self-pay | Admitting: Cardiology

## 2011-03-26 ENCOUNTER — Other Ambulatory Visit: Payer: Self-pay | Admitting: *Deleted

## 2011-03-26 DIAGNOSIS — E785 Hyperlipidemia, unspecified: Secondary | ICD-10-CM

## 2011-03-26 DIAGNOSIS — I495 Sick sinus syndrome: Secondary | ICD-10-CM

## 2011-03-26 DIAGNOSIS — I10 Essential (primary) hypertension: Secondary | ICD-10-CM | POA: Insufficient documentation

## 2011-03-26 DIAGNOSIS — Z79899 Other long term (current) drug therapy: Secondary | ICD-10-CM

## 2011-03-26 LAB — HEPATIC FUNCTION PANEL
AST: 23 U/L (ref 0–37)
Albumin: 3.8 g/dL (ref 3.5–5.2)
Alkaline Phosphatase: 67 U/L (ref 39–117)
Bilirubin, Direct: 0.1 mg/dL (ref 0.0–0.3)
Total Protein: 6.6 g/dL (ref 6.0–8.3)

## 2011-03-26 LAB — CBC WITH DIFFERENTIAL/PLATELET
Basophils Absolute: 0.1 10*3/uL (ref 0.0–0.1)
Eosinophils Relative: 1.7 % (ref 0.0–5.0)
Monocytes Relative: 10.8 % (ref 3.0–12.0)
Neutrophils Relative %: 55.2 % (ref 43.0–77.0)
Platelets: 234 10*3/uL (ref 150.0–400.0)
WBC: 5.2 10*3/uL (ref 4.5–10.5)

## 2011-03-26 LAB — LIPID PANEL
Total CHOL/HDL Ratio: 2
VLDL: 30.2 mg/dL (ref 0.0–40.0)

## 2011-03-26 LAB — BASIC METABOLIC PANEL
CO2: 26 mEq/L (ref 19–32)
GFR: 102 mL/min (ref >60.00)
Glucose, Bld: 85 mg/dL (ref 70–99)
Potassium: 4 mEq/L (ref 3.5–5.1)
Sodium: 140 mEq/L (ref 135–145)

## 2011-03-26 MED ORDER — RAMIPRIL 10 MG PO CAPS: 10.0000 mg | ORAL_CAPSULE | Freq: Every day | ORAL | Status: DC

## 2011-03-26 MED ORDER — HYDROCHLOROTHIAZIDE 12.5 MG PO CAPS: 12.5000 mg | ORAL_CAPSULE | Freq: Every day | ORAL | Status: DC

## 2011-03-26 MED ORDER — CITALOPRAM HYDROBROMIDE 20 MG PO TABS: 20.0000 mg | ORAL_TABLET | Freq: Every day | ORAL | Status: AC

## 2011-03-26 NOTE — Assessment & Plan Note (Signed)
We will continue Altace and hydrochlorothiazide. Lab work

## 2011-03-26 NOTE — Assessment & Plan Note (Signed)
Pacemaker followup per Dr. Graciela Husbands.

## 2011-03-26 NOTE — Progress Notes (Signed)
Subjective:   Judy Chapman is seen today for followup visit she had a history of syncope and sinus arrest and had her first pacemaker in 2002. Her last pulse generator replacement was in April of 2010 and she has a Medtronic Adapta L. Pulse generator. She has Guidant leads. Overall, she continues to do well. She will see Dr. Graciela Husbands for general followup.  She has a history of hypertension and mild obesity. She has mild dyslipidemia and mild dysthymic symptoms treated with Celexa.   Current Outpatient Prescriptions  Medication Sig Dispense Refill  . aspirin 81 MG tablet Take 81 mg by mouth daily.        . calcium carbonate (OS-CAL) 600 MG TABS Take 600 mg by mouth 2 (two) times daily with a meal.        . Estradiol (ESTRACE PO) Take by mouth daily.        Marland Kitchen GLUCOSAMINE-CHONDROITIN PO Take 1 tablet by mouth daily.        Marland Kitchen DISCONTD: citalopram (CELEXA) 20 MG tablet Take 20 mg by mouth daily.        Marland Kitchen DISCONTD: hydrochlorothiazide (,MICROZIDE/HYDRODIURIL,) 12.5 MG capsule Take 12.5 mg by mouth daily.        Marland Kitchen DISCONTD: ramipril (ALTACE) 10 MG capsule Take 10 mg by mouth daily.        . citalopram (CELEXA) 20 MG tablet Take 1 tablet (20 mg total) by mouth daily.  90 tablet  2  . hydrochlorothiazide (,MICROZIDE/HYDRODIURIL,) 12.5 MG capsule Take 1 capsule (12.5 mg total) by mouth daily.  90 capsule  2  . ramipril (ALTACE) 10 MG capsule Take 1 capsule (10 mg total) by mouth daily.  90 capsule  2    No Known Allergies  There is no problem list on file for this patient.   History  Smoking status  . Former Smoker -- 0.3 packs/day for 10 years  . Types: Cigarettes  . Quit date: 10/26/2005  Smokeless tobacco  . Never Used    History  Alcohol Use  . 0.5 - 1.0 oz/week  . 1-2 drink(s) per week    Family History  Problem Relation Age of Onset  . Arrhythmia Neg Hx   . Clotting disorder Neg Hx   . Heart attack Neg Hx   . Fainting Neg Hx   . Anemia Neg Hx   . Asthma Neg Hx   . Heart disease Neg  Hx   . Heart failure Neg Hx   . Hyperlipidemia Neg Hx   . Hypertension Neg Hx     Review of Systems:   The patient denies any heat or cold intolerance.  No weight gain or weight loss.  The patient denies headaches or blurry vision.  There is no cough or sputum production.  The patient denies dizziness.  There is no hematuria or hematochezia.  The patient denies any muscle aches or arthritis.  The patient denies any rash.  The patient denies frequent falling or instability.  There is no history of depression or anxiety.  All other systems were reviewed and are negative.   Physical Exam:   Weight is 174. Blood pressure is 128/70. Heart rate 66.The head is normocephalic and atraumatic.  Pupils are equally round and reactive to light.  Sclerae nonicteric.  Conjunctiva is clear.  Oropharynx is unremarkable.  There's adequate oral airway.  Neck is supple there are no masses.  Thyroid is not enlarged.  There is no lymphadenopathy.  Lungs are clear.  Chest is symmetric.  Heart shows a regular rate and rhythm.  S1 and S2 are normal.  There is no murmur click or gallop.  Abdomen is soft normal bowel sounds.  There is no organomegaly.  Genital and rectal deferred.  Extremities are without edema.  Peripheral pulses are adequate.  Neurologically intact.  Full range of motion.  The patient is not depressed.  Skin is warm and dry.  Assessment / Plan:

## 2011-04-01 ENCOUNTER — Telehealth: Payer: Self-pay | Admitting: *Deleted

## 2011-04-01 NOTE — Telephone Encounter (Signed)
Message copied by Adolphus Birchwood on Wed Apr 01, 2011  9:10 AM ------      Message from: Roger Shelter      Created: Wed Apr 01, 2011  8:33 AM       Work on diet. Recheck in six months.

## 2011-04-01 NOTE — Telephone Encounter (Signed)
Pt notified of lab results and was encourage to work on diet.  Pt also was notified of appointment with Dr. Waynard Edwards on 11/19/10 at 1015.

## 2011-04-30 ENCOUNTER — Encounter: Payer: Medicare Other | Admitting: *Deleted

## 2011-05-04 ENCOUNTER — Encounter: Payer: Self-pay | Admitting: *Deleted

## 2011-05-05 ENCOUNTER — Other Ambulatory Visit: Payer: Self-pay | Admitting: Internal Medicine

## 2011-05-06 ENCOUNTER — Ambulatory Visit (INDEPENDENT_AMBULATORY_CARE_PROVIDER_SITE_OTHER): Payer: Medicare Other | Admitting: *Deleted

## 2011-05-06 DIAGNOSIS — I495 Sick sinus syndrome: Secondary | ICD-10-CM

## 2011-05-14 LAB — REMOTE PACEMAKER DEVICE
AL AMPLITUDE: 2.8 mv
AL IMPEDENCE PM: 442 Ohm
AL THRESHOLD: 0.5 V
BATTERY VOLTAGE: 2.8 V
RV LEAD AMPLITUDE: 16 mv

## 2011-05-20 ENCOUNTER — Encounter: Payer: Self-pay | Admitting: *Deleted

## 2011-05-20 NOTE — Progress Notes (Signed)
Pacer remote check  

## 2011-06-22 ENCOUNTER — Other Ambulatory Visit: Payer: Self-pay | Admitting: Internal Medicine

## 2011-06-22 DIAGNOSIS — Z1231 Encounter for screening mammogram for malignant neoplasm of breast: Secondary | ICD-10-CM

## 2011-07-13 ENCOUNTER — Ambulatory Visit
Admission: RE | Admit: 2011-07-13 | Discharge: 2011-07-13 | Disposition: A | Discharge status: Home / Self Care | Payer: Medicare Other | Source: Ambulatory Visit | Attending: Internal Medicine | Admitting: Internal Medicine

## 2011-07-13 DIAGNOSIS — Z1231 Encounter for screening mammogram for malignant neoplasm of breast: Secondary | ICD-10-CM

## 2011-07-24 ENCOUNTER — Other Ambulatory Visit: Payer: Self-pay | Admitting: Internal Medicine

## 2011-07-24 MED ORDER — HYDROCHLOROTHIAZIDE 12.5 MG PO CAPS: 12.5000 mg | ORAL_CAPSULE | Freq: Every day | ORAL | Status: DC

## 2011-07-24 NOTE — Telephone Encounter (Signed)
[facility] 90 day supply - 3 refills.

## 2011-07-24 NOTE — Telephone Encounter (Signed)
Should have filled through PCP.

## 2011-08-20 ENCOUNTER — Other Ambulatory Visit: Payer: Self-pay | Admitting: Internal Medicine

## 2011-08-20 ENCOUNTER — Encounter: Payer: Self-pay | Admitting: Internal Medicine

## 2011-08-20 ENCOUNTER — Ambulatory Visit (INDEPENDENT_AMBULATORY_CARE_PROVIDER_SITE_OTHER): Payer: Medicare Other | Admitting: *Deleted

## 2011-08-20 DIAGNOSIS — I495 Sick sinus syndrome: Secondary | ICD-10-CM

## 2011-08-23 LAB — REMOTE PACEMAKER DEVICE
ATRIAL PACING PM: 39
BAMS-0001: 175 {beats}/min
BATTERY VOLTAGE: 2.8 V
RV LEAD THRESHOLD: 0.625 V
VENTRICULAR PACING PM: 4

## 2011-08-26 ENCOUNTER — Encounter: Payer: Self-pay | Admitting: Gynecology

## 2011-08-26 DIAGNOSIS — M8580 Other specified disorders of bone density and structure, unspecified site: Secondary | ICD-10-CM | POA: Insufficient documentation

## 2011-08-26 DIAGNOSIS — N809 Endometriosis, unspecified: Secondary | ICD-10-CM | POA: Insufficient documentation

## 2011-08-27 NOTE — Progress Notes (Signed)
Pacer remote check  

## 2011-09-04 ENCOUNTER — Ambulatory Visit (INDEPENDENT_AMBULATORY_CARE_PROVIDER_SITE_OTHER): Payer: Medicare Other | Admitting: Obstetrics and Gynecology

## 2011-09-04 ENCOUNTER — Encounter: Payer: Self-pay | Admitting: Obstetrics and Gynecology

## 2011-09-04 VITALS — BP 120/74 | Ht 65.0 in | Wt 178.0 lb

## 2011-09-04 DIAGNOSIS — Z780 Asymptomatic menopausal state: Secondary | ICD-10-CM

## 2011-09-04 DIAGNOSIS — N952 Postmenopausal atrophic vaginitis: Secondary | ICD-10-CM

## 2011-09-04 DIAGNOSIS — M8580 Other specified disorders of bone density and structure, unspecified site: Secondary | ICD-10-CM

## 2011-09-04 DIAGNOSIS — Z124 Encounter for screening for malignant neoplasm of cervix: Secondary | ICD-10-CM

## 2011-09-04 DIAGNOSIS — M899 Disorder of bone, unspecified: Secondary | ICD-10-CM

## 2011-09-04 DIAGNOSIS — N951 Menopausal and female climacteric states: Secondary | ICD-10-CM

## 2011-09-04 MED ORDER — ESTRADIOL 1 MG PO TABS: 1.0000 mg | ORAL_TABLET | Freq: Every day | ORAL | Status: DC

## 2011-09-04 NOTE — Progress Notes (Signed)
Subjective:     Patient ID: Judy Chapman, female   DOB: 1946/04/06, 65 y.o.   MRN: [id number]  HPIpatient came back to see me today for further followup. She remains on hormone replacement therapy. She tapered her self off but became symptomatic. One of the issues was significant hair change. She is now back on and feeling better. She is up-to-date on her mammograms. She had a previous bone density showing osteopenia. She is worked with calcium and vitamin D and her last bone density showed significant improvement. She's had no fractures. She is having no pelvic pain or vaginal bleeding. Her menopausal symptoms and vaginal dryness R. Resolved on HRT.   Review of Systems  Constitutional: Negative.   HENT:       Chronic sinusitis  Eyes: Negative.   Respiratory: Negative.   Cardiovascular:       Hypertension  Gastrointestinal: Negative.   Genitourinary: Negative.   Musculoskeletal: Negative.   Skin: Negative.   Neurological: Negative.   Hematological: Negative.   Psychiatric/Behavioral: Negative.        Objective:   Physical ExamHEENT: Within normal limits.  Judy Chapman present Neck: No masses. Supraclavicular lymph nodes: Not enlarged. Breasts: Examined in both sitting and lying position. Symmetrical without skin changes or masses. Abdomen: Soft no masses guarding or rebound. No hernias. Pelvic: External within normal limits. BUS within normal limits. Vaginal examination shows good estrogen effect, no cystocele enterocele or rectocele. Cervix and uterus absent. Adnexa within normal limits. Rectovaginal confirmatory. Extremities within normal limits.      Assessment:  #1. Menopausal symptoms #2. Atrophic vaginitis #3. Osteopenia     Plan:     Discussed in detail pros and cons of HRT. Discussed patch estrogen. Patient declined.continue oral estradiol. Continue yearly mammograms.

## 2011-09-15 ENCOUNTER — Encounter: Payer: Self-pay | Admitting: *Deleted

## 2011-11-19 ENCOUNTER — Encounter: Payer: Medicare Other | Admitting: *Deleted

## 2011-11-23 ENCOUNTER — Encounter: Payer: Self-pay | Admitting: *Deleted

## 2011-11-25 ENCOUNTER — Ambulatory Visit (INDEPENDENT_AMBULATORY_CARE_PROVIDER_SITE_OTHER): Payer: Medicare Other | Admitting: *Deleted

## 2011-11-25 ENCOUNTER — Encounter: Payer: Self-pay | Admitting: Internal Medicine

## 2011-11-25 DIAGNOSIS — I495 Sick sinus syndrome: Secondary | ICD-10-CM

## 2011-11-28 LAB — REMOTE PACEMAKER DEVICE
AL AMPLITUDE: 2.8 mv
AL IMPEDENCE PM: 442 Ohm
AL THRESHOLD: 0.5 V
ATRIAL PACING PM: 35
RV LEAD IMPEDENCE PM: 532 Ohm
RV LEAD THRESHOLD: 0.625 V

## 2011-12-02 NOTE — Progress Notes (Signed)
Remote pacer check  

## 2011-12-15 ENCOUNTER — Encounter: Payer: Self-pay | Admitting: *Deleted

## 2012-01-21 ENCOUNTER — Encounter: Payer: Self-pay | Admitting: Internal Medicine

## 2012-01-21 ENCOUNTER — Ambulatory Visit (INDEPENDENT_AMBULATORY_CARE_PROVIDER_SITE_OTHER)
Admission: RE | Admit: 2012-01-21 | Discharge: 2012-01-21 | Disposition: A | Discharge status: Home / Self Care | Payer: Medicare Other | Source: Ambulatory Visit | Attending: Internal Medicine | Admitting: Internal Medicine

## 2012-01-21 ENCOUNTER — Ambulatory Visit (INDEPENDENT_AMBULATORY_CARE_PROVIDER_SITE_OTHER): Payer: Medicare Other | Admitting: Internal Medicine

## 2012-01-21 VITALS — BP 112/70 | HR 65 | Temp 98.6°F | Resp 16 | Wt 181.0 lb

## 2012-01-21 DIAGNOSIS — R059 Cough, unspecified: Secondary | ICD-10-CM

## 2012-01-21 DIAGNOSIS — J209 Acute bronchitis, unspecified: Secondary | ICD-10-CM

## 2012-01-21 DIAGNOSIS — R05 Cough: Secondary | ICD-10-CM

## 2012-01-21 DIAGNOSIS — J189 Pneumonia, unspecified organism: Secondary | ICD-10-CM | POA: Insufficient documentation

## 2012-01-21 MED ORDER — HYDROCODONE-HOMATROPINE 5-1.5 MG/5ML PO SYRP: 5.0000 mL | ORAL_SOLUTION | Freq: Three times a day (TID) | ORAL | Status: AC | PRN

## 2012-01-21 NOTE — Assessment & Plan Note (Signed)
Continue hycodan and augmentin for now

## 2012-01-21 NOTE — Progress Notes (Signed)
Subjective:    Patient ID: Judy Chapman, female    DOB: 08-03-1946, 66 y.o.   MRN: [id number]  Cough This is a new problem. The current episode started in the past 7 days. The problem has been unchanged. The problem occurs every few hours. The cough is productive of purulent sputum. Associated symptoms include chills, ear pain, a fever, rhinorrhea, a sore throat, shortness of breath and sweats. Pertinent negatives include no chest pain, ear congestion, headaches, heartburn, hemoptysis, myalgias, nasal congestion, postnasal drip, rash, weight loss or wheezing. The symptoms are aggravated by nothing. She has tried prescription cough suppressant (zpak, started augmentin yesterday) for the symptoms. The treatment provided mild relief.      Review of Systems  Constitutional: Positive for fever, chills and fatigue. Negative for weight loss, diaphoresis, activity change, appetite change and unexpected weight change.  HENT: Positive for ear pain, sore throat and rhinorrhea. Negative for nosebleeds, congestion, sneezing, drooling, mouth sores, trouble swallowing, dental problem, voice change, postnasal drip, sinus pressure and tinnitus.   Eyes: Negative.   Respiratory: Positive for cough and shortness of breath. Negative for apnea, hemoptysis, choking, chest tightness, wheezing and stridor.   Cardiovascular: Negative for chest pain, palpitations and leg swelling.  Gastrointestinal: Negative for heartburn, nausea, vomiting, abdominal pain, diarrhea, constipation, blood in stool and abdominal distention.  Genitourinary: Negative.   Musculoskeletal: Negative for myalgias, joint swelling, arthralgias and gait problem.  Skin: Negative for color change, pallor, rash and wound.  Neurological: Negative.  Negative for headaches.  Hematological: Negative for adenopathy. Does not bruise/bleed easily.  Psychiatric/Behavioral: Negative.        Objective:   Physical Exam  Vitals reviewed. Constitutional: She  is oriented to person, place, and time. She appears well-developed and well-nourished.  Non-toxic appearance. She does not have a sickly appearance. She does not appear ill. No distress.  HENT:  Head: Normocephalic and atraumatic. No trismus in the jaw.  Right Ear: Hearing, tympanic membrane, external ear and ear canal normal.  Left Ear: Hearing, tympanic membrane, external ear and ear canal normal.  Nose: Nose normal. No mucosal edema, rhinorrhea or sinus tenderness. No epistaxis. Right sinus exhibits no maxillary sinus tenderness and no frontal sinus tenderness. Left sinus exhibits no maxillary sinus tenderness and no frontal sinus tenderness.  Mouth/Throat: Oropharynx is clear and moist and mucous membranes are normal. Mucous membranes are not pale, not dry and not cyanotic. No uvula swelling. No oropharyngeal exudate, posterior oropharyngeal edema, posterior oropharyngeal erythema or tonsillar abscesses.  Eyes: Conjunctivae are normal. Right eye exhibits no discharge. Left eye exhibits no discharge. No scleral icterus.  Neck: Normal range of motion. Neck supple. No JVD present. No tracheal deviation present. No thyromegaly present.  Cardiovascular: Normal rate, regular rhythm, normal heart sounds and intact distal pulses.  Exam reveals no gallop and no friction rub.   No murmur heard. Pulmonary/Chest: Effort normal. No accessory muscle usage. Not tachypneic. No respiratory distress. She has no decreased breath sounds. She has no wheezes. She has no rhonchi. She has rales in the right lower field and the left lower field.  Abdominal: Soft. Bowel sounds are normal. She exhibits no distension and no mass. There is no tenderness. There is no rebound and no guarding.  Musculoskeletal: Normal range of motion. She exhibits no edema and no tenderness.  Lymphadenopathy:    She has no cervical adenopathy.  Neurological: She is oriented to person, place, and time.  Skin: Skin is warm and dry. No rash  noted.  She is not diaphoretic. No erythema. No pallor.  Psychiatric: She has a normal mood and affect. Her behavior is normal. Judgment and thought content normal.     Lab Results  Component Value Date   WBC 5.2 03/26/2011   HGB 13.5 03/26/2011   HCT 39.5 03/26/2011   PLT 234.0 03/26/2011   GLUCOSE 85 03/26/2011   CHOL 176 03/26/2011   TRIG 151.0* 03/26/2011   HDL 79.70 03/26/2011   LDLCALC 66 03/26/2011   ALT 23 03/26/2011   AST 23 03/26/2011   NA 140 03/26/2011   K 4.0 03/26/2011   CL 105 03/26/2011   CREATININE 0.6 03/26/2011   BUN 18 03/26/2011   CO2 26 03/26/2011       Assessment & Plan:

## 2012-01-21 NOTE — Assessment & Plan Note (Signed)
I will check her CXR today to look for pna, mass, edema 

## 2012-01-21 NOTE — Patient Instructions (Signed)

## 2012-02-01 ENCOUNTER — Encounter: Payer: Self-pay | Admitting: *Deleted

## 2012-02-02 ENCOUNTER — Encounter: Payer: Self-pay | Admitting: Internal Medicine

## 2012-02-02 ENCOUNTER — Ambulatory Visit (INDEPENDENT_AMBULATORY_CARE_PROVIDER_SITE_OTHER): Payer: Medicare Other | Admitting: Internal Medicine

## 2012-02-02 VITALS — BP 116/83 | HR 83 | Ht 66.0 in | Wt 177.0 lb

## 2012-02-02 DIAGNOSIS — I495 Sick sinus syndrome: Secondary | ICD-10-CM

## 2012-02-02 DIAGNOSIS — Z950 Presence of cardiac pacemaker: Secondary | ICD-10-CM | POA: Insufficient documentation

## 2012-02-02 DIAGNOSIS — I10 Essential (primary) hypertension: Secondary | ICD-10-CM

## 2012-02-02 LAB — PACEMAKER DEVICE OBSERVATION
AL AMPLITUDE: 2.8 mv
AL THRESHOLD: 0.375 V
BAMS-0001: 175 {beats}/min
BATTERY VOLTAGE: 2.8 V
RV LEAD AMPLITUDE: 16 mv

## 2012-02-02 NOTE — Assessment & Plan Note (Signed)
Reasonably well controlled we'll continue current medications

## 2012-02-02 NOTE — Assessment & Plan Note (Signed)
30% atrially paced 

## 2012-02-02 NOTE — Patient Instructions (Signed)

## 2012-02-02 NOTE — Progress Notes (Signed)
  HPI  Judy Chapman is a 66 y.o. 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 He was initially implanted in 2002 with device generator replacement March 2010. This was done by Dr. Deborah Chalk.  She is here for followup.  The patient denies chest pain, shortness of breath, nocturnal dyspnea, orthopnea or peripheral edema.  There have been no palpitations, lightheadedness or syncope.   She is getting over pneumonia for which she received penicillin and to which she had an allergic reaction.   She also has a history of hypertension     Past Medical History  Diagnosis Date  . PVC (premature ventricular contraction)   . HTN (hypertension)   . Endometriosis   . Osteopenia   . Heart disease   . Pacemaker     medtronic    Past Surgical History  Procedure Date  . Coronary angioplasty   . Pacemaker insertion     Medtronic  . Vaginal hysterectomy   . Gallbladder surgery   . Tubal ligation   . Dilation and curettage of uterus   . Hysteroscopy   . Breast lumpectomy     Benign    Current Outpatient Prescriptions  Medication Sig Dispense Refill  . aspirin 81 MG tablet Take 81 mg by mouth daily.        . calcium carbonate (OS-CAL) 600 MG TABS Take 600 mg by mouth 2 (two) times daily with a meal.        . citalopram (CELEXA) 20 MG tablet Take 1 tablet (20 mg total) by mouth daily.  90 tablet  2  . estradiol (ESTRACE) 1 MG tablet Take 1 tablet (1 mg total) by mouth daily.  90 tablet  4  . GLUCOSAMINE-CHONDROITIN PO Take 1 tablet by mouth daily.        . hydrochlorothiazide (MICROZIDE) 12.5 MG capsule Take 1 capsule (12.5 mg total) by mouth daily.  90 capsule  2  . ramipril (ALTACE) 10 MG capsule Take 1 capsule (10 mg total) by mouth daily.  90 capsule  2    No Known Allergies  Review of Systems negative except from HPI and PMH  Physical Exam BP 116/83  Pulse 83  Ht 5\' 6"  (1.676 m)  Wt 177 lb (80.287 kg)  BMI 28.57 kg/m2 Well developed and well  nourished in no acute distress HENT normal E scleral and icterus clear Neck Supple JVP flat; carotids brisk and full Clear to ausculation egular rate and rhythm, no murmurs gallops or rub Soft with active bowel sounds No clubbing cyanosis none Edema Alert and oriented, grossly normal motor and sensory function Skin Warm and Dry   Assessment and  Plan

## 2012-02-02 NOTE — Assessment & Plan Note (Signed)
The patient's device was interrogated.  The information was reviewed. No changes were made in the programming.    

## 2012-02-11 ENCOUNTER — Telehealth: Payer: Self-pay

## 2012-02-11 ENCOUNTER — Ambulatory Visit (INDEPENDENT_AMBULATORY_CARE_PROVIDER_SITE_OTHER): Payer: Medicare Other | Admitting: Internal Medicine

## 2012-02-11 ENCOUNTER — Encounter: Payer: Self-pay | Admitting: Internal Medicine

## 2012-02-11 ENCOUNTER — Ambulatory Visit (INDEPENDENT_AMBULATORY_CARE_PROVIDER_SITE_OTHER)
Admission: RE | Admit: 2012-02-11 | Discharge: 2012-02-11 | Disposition: A | Discharge status: Home / Self Care | Payer: Medicare Other | Source: Ambulatory Visit | Attending: Internal Medicine | Admitting: Internal Medicine

## 2012-02-11 VITALS — BP 136/86 | HR 74 | Temp 97.8°F | Resp 16 | Wt 179.2 lb

## 2012-02-11 DIAGNOSIS — I10 Essential (primary) hypertension: Secondary | ICD-10-CM

## 2012-02-11 DIAGNOSIS — J189 Pneumonia, unspecified organism: Secondary | ICD-10-CM

## 2012-02-11 DIAGNOSIS — R05 Cough: Secondary | ICD-10-CM

## 2012-02-11 DIAGNOSIS — J309 Allergic rhinitis, unspecified: Secondary | ICD-10-CM

## 2012-02-11 DIAGNOSIS — R918 Other nonspecific abnormal finding of lung field: Secondary | ICD-10-CM | POA: Insufficient documentation

## 2012-02-11 DIAGNOSIS — R059 Cough, unspecified: Secondary | ICD-10-CM

## 2012-02-11 DIAGNOSIS — R222 Localized swelling, mass and lump, trunk: Secondary | ICD-10-CM

## 2012-02-11 MED ORDER — METHYLPREDNISOLONE 4 MG PO KIT: PACK | ORAL | Status: AC

## 2012-02-11 NOTE — Telephone Encounter (Signed)
Patient notified

## 2012-02-11 NOTE — Telephone Encounter (Signed)
Per rose, pt needs BUN and creatinine labs.

## 2012-02-11 NOTE — Progress Notes (Signed)
Addended by: Etta Grandchild on: 02/11/2012 02:36 PM   Modules accepted: Orders

## 2012-02-11 NOTE — Assessment & Plan Note (Signed)
Her BP is well controlled 

## 2012-02-11 NOTE — Assessment & Plan Note (Signed)
I am concerned that she has developed an ACEI cough and have asked her to stop the ACEI but she will not agree to do that

## 2012-02-11 NOTE — Patient Instructions (Signed)

## 2012-02-11 NOTE — Assessment & Plan Note (Signed)
I will recheck her CXR today 

## 2012-02-11 NOTE — Progress Notes (Signed)
Subjective:    Patient ID: Judy Chapman, female    DOB: Sep 28, 1946, 66 y.o.   MRN: [id number]  Cough This is a recurrent problem. The current episode started 1 to 4 weeks ago. The problem has been unchanged. The problem occurs every few hours. The cough is non-productive. Associated symptoms include nasal congestion, postnasal drip and rhinorrhea. Pertinent negatives include no chest pain, chills, ear congestion, ear pain, fever, headaches, heartburn, hemoptysis, myalgias, rash, sore throat, shortness of breath, sweats, weight loss or wheezing. Exacerbated by: pollen. Treatments tried: claritin-d. The treatment provided moderate relief. Her past medical history is significant for environmental allergies and pneumonia.      Review of Systems  Constitutional: Negative for fever, chills, weight loss, diaphoresis, activity change, appetite change and fatigue.  HENT: Positive for rhinorrhea and postnasal drip. Negative for hearing loss, ear pain, nosebleeds, congestion, sore throat, facial swelling, sneezing, drooling, mouth sores, trouble swallowing, neck pain, neck stiffness, dental problem, voice change, sinus pressure, tinnitus and ear discharge.   Eyes: Negative.   Respiratory: Positive for cough. Negative for apnea, hemoptysis, choking, chest tightness, shortness of breath, wheezing and stridor.   Cardiovascular: Negative for chest pain, palpitations and leg swelling.  Gastrointestinal: Negative.  Negative for heartburn, nausea, vomiting, abdominal pain, diarrhea, constipation and anal bleeding.  Musculoskeletal: Negative for myalgias, back pain, joint swelling, arthralgias and gait problem.  Skin: Negative for color change, pallor, rash and wound.  Neurological: Negative for dizziness and headaches.  Hematological: Positive for environmental allergies. Negative for adenopathy. Does not bruise/bleed easily.  Psychiatric/Behavioral: Negative.        Objective:   Physical Exam  Vitals  reviewed. Constitutional: She is oriented to person, place, and time. Vital signs are normal. She appears well-developed and well-nourished.  Non-toxic appearance. She does not have a sickly appearance. She does not appear ill. No distress.  HENT:  Head: Normocephalic and atraumatic. No trismus in the jaw.  Right Ear: Hearing, tympanic membrane, external ear and ear canal normal.  Left Ear: Hearing, tympanic membrane, external ear and ear canal normal.  Nose: Mucosal edema and rhinorrhea present. No nose lacerations, sinus tenderness, nasal deformity, septal deviation or nasal septal hematoma. No epistaxis.  No foreign bodies. Right sinus exhibits no maxillary sinus tenderness and no frontal sinus tenderness. Left sinus exhibits no maxillary sinus tenderness and no frontal sinus tenderness.  Mouth/Throat: Oropharynx is clear and moist and mucous membranes are normal. Mucous membranes are not pale, not dry and not cyanotic. No uvula swelling. No oropharyngeal exudate, posterior oropharyngeal edema, posterior oropharyngeal erythema or tonsillar abscesses.  Eyes: Conjunctivae are normal. Right eye exhibits no discharge. Left eye exhibits no discharge. No scleral icterus.  Neck: Normal range of motion. Neck supple. No JVD present. No tracheal deviation present. No thyromegaly present.  Cardiovascular: Normal rate, regular rhythm and intact distal pulses.  Exam reveals no gallop and no friction rub.   No murmur heard. Pulmonary/Chest: Effort normal and breath sounds normal. No stridor. No respiratory distress. She has no wheezes. She exhibits no tenderness.  Abdominal: Soft. Bowel sounds are normal. She exhibits no distension. There is no tenderness. There is no rebound and no guarding.  Musculoskeletal: Normal range of motion. She exhibits no edema and no tenderness.  Lymphadenopathy:    She has no cervical adenopathy.  Neurological: She is oriented to person, place, and time.  Skin: Skin is warm and  dry. No rash noted. She is not diaphoretic. No erythema. No pallor.  Psychiatric: She  has a normal mood and affect. Her behavior is normal. Judgment and thought content normal.   Dg Chest 2 View  01/21/2012  *RADIOLOGY REPORT*  Clinical Data: History of cough and body aches.  CHEST - 2 VIEW  Comparison: No priors.  Findings: There is a poorly defined opacity projecting over the left lower lung on the frontal view which slightly obscures the left cardiac silhouette and projects over the heart on the lateral view, concerning for an area of consolidation in the lingula. Lungs are otherwise clear.  No pleural effusions.  Pulmonary vasculature is normal.  Heart size is upper limits of normal. Right-sided pacemaker in place with lead tips projecting over the right atrial appendage and right ventricle.  Mediastinal contours are unremarkable.  IMPRESSION: 1.  Findings, as above, concerning for airspace consolidation in the lingula.  This could represent a bronchopneumonia.  Original Report Authenticated By: Florencia Reasons, M.D.     Assessment & Plan:

## 2012-02-11 NOTE — Assessment & Plan Note (Signed)
She will try a medrol dose pak for symptom relief

## 2012-02-12 ENCOUNTER — Ambulatory Visit (INDEPENDENT_AMBULATORY_CARE_PROVIDER_SITE_OTHER)
Admission: RE | Admit: 2012-02-12 | Discharge: 2012-02-12 | Disposition: A | Discharge status: Home / Self Care | Payer: Medicare Other | Source: Ambulatory Visit | Attending: Cardiology | Admitting: Cardiology

## 2012-02-12 ENCOUNTER — Other Ambulatory Visit (INDEPENDENT_AMBULATORY_CARE_PROVIDER_SITE_OTHER): Payer: Medicare Other

## 2012-02-12 DIAGNOSIS — I10 Essential (primary) hypertension: Secondary | ICD-10-CM

## 2012-02-12 DIAGNOSIS — R059 Cough, unspecified: Secondary | ICD-10-CM

## 2012-02-12 DIAGNOSIS — R05 Cough: Secondary | ICD-10-CM

## 2012-02-12 DIAGNOSIS — J189 Pneumonia, unspecified organism: Secondary | ICD-10-CM

## 2012-02-12 DIAGNOSIS — R222 Localized swelling, mass and lump, trunk: Secondary | ICD-10-CM

## 2012-02-12 DIAGNOSIS — R918 Other nonspecific abnormal finding of lung field: Secondary | ICD-10-CM

## 2012-02-12 MED ORDER — IOHEXOL 300 MG/ML  SOLN
80.0000 mL | Freq: Once | INTRAMUSCULAR | Status: AC | PRN
  Administered 2012-02-12: 80 mL via INTRAVENOUS

## 2012-02-12 MED ORDER — MOXIFLOXACIN HCL 400 MG PO TABS: 400.0000 mg | ORAL_TABLET | Freq: Every day | ORAL | Status: AC

## 2012-02-12 NOTE — Progress Notes (Signed)
Addended by: Etta Grandchild on: 02/12/2012 02:54 PM   Modules accepted: Orders

## 2012-02-26 ENCOUNTER — Telehealth: Payer: Self-pay

## 2012-02-26 DIAGNOSIS — J189 Pneumonia, unspecified organism: Secondary | ICD-10-CM

## 2012-02-26 NOTE — Telephone Encounter (Signed)
Patient called [facility] stating she received letter to have a repeat CXR to make sure pneumonia has cleared, please advise if ok to order

## 2012-02-26 NOTE — Telephone Encounter (Signed)
Patient notified

## 2012-02-26 NOTE — Telephone Encounter (Signed)
yes

## 2012-02-29 ENCOUNTER — Encounter: Payer: Self-pay | Admitting: Internal Medicine

## 2012-02-29 ENCOUNTER — Ambulatory Visit (INDEPENDENT_AMBULATORY_CARE_PROVIDER_SITE_OTHER)
Admission: RE | Admit: 2012-02-29 | Discharge: 2012-02-29 | Disposition: A | Discharge status: Home / Self Care | Payer: Medicare Other | Source: Ambulatory Visit | Attending: Internal Medicine | Admitting: Internal Medicine

## 2012-02-29 DIAGNOSIS — J189 Pneumonia, unspecified organism: Secondary | ICD-10-CM

## 2012-04-19 ENCOUNTER — Other Ambulatory Visit: Payer: Self-pay | Admitting: Cardiology

## 2012-05-05 ENCOUNTER — Encounter: Payer: Medicare Other | Admitting: *Deleted

## 2012-05-13 ENCOUNTER — Encounter: Payer: Self-pay | Admitting: *Deleted

## 2012-05-21 ENCOUNTER — Encounter: Payer: Self-pay | Admitting: Internal Medicine

## 2012-05-23 ENCOUNTER — Ambulatory Visit (INDEPENDENT_AMBULATORY_CARE_PROVIDER_SITE_OTHER): Payer: Medicare Other | Admitting: *Deleted

## 2012-05-23 DIAGNOSIS — I495 Sick sinus syndrome: Secondary | ICD-10-CM

## 2012-05-23 DIAGNOSIS — Z950 Presence of cardiac pacemaker: Secondary | ICD-10-CM

## 2012-05-23 LAB — REMOTE PACEMAKER DEVICE
AL IMPEDENCE PM: 425 Ohm
AL THRESHOLD: 0.375 V
ATRIAL PACING PM: 22
BAMS-0001: 175 {beats}/min
BATTERY VOLTAGE: 2.8 V
RV LEAD AMPLITUDE: 16 mv

## 2012-06-16 ENCOUNTER — Other Ambulatory Visit: Payer: Self-pay | Admitting: Cardiology

## 2012-06-16 ENCOUNTER — Other Ambulatory Visit: Payer: Self-pay | Admitting: Internal Medicine

## 2012-06-23 ENCOUNTER — Encounter: Payer: Self-pay | Admitting: *Deleted

## 2012-07-26 ENCOUNTER — Other Ambulatory Visit: Payer: Self-pay | Admitting: Internal Medicine

## 2012-07-26 DIAGNOSIS — Z1231 Encounter for screening mammogram for malignant neoplasm of breast: Secondary | ICD-10-CM

## 2012-08-04 ENCOUNTER — Encounter: Payer: Self-pay | Admitting: Cardiology

## 2012-08-22 ENCOUNTER — Ambulatory Visit (INDEPENDENT_AMBULATORY_CARE_PROVIDER_SITE_OTHER): Payer: Medicare Other | Admitting: *Deleted

## 2012-08-22 ENCOUNTER — Ambulatory Visit
Admission: RE | Admit: 2012-08-22 | Discharge: 2012-08-22 | Disposition: A | Discharge status: Home / Self Care | Payer: Medicare Other | Source: Ambulatory Visit | Attending: Internal Medicine | Admitting: Internal Medicine

## 2012-08-22 DIAGNOSIS — Z1231 Encounter for screening mammogram for malignant neoplasm of breast: Secondary | ICD-10-CM

## 2012-08-22 DIAGNOSIS — I495 Sick sinus syndrome: Secondary | ICD-10-CM

## 2012-08-22 DIAGNOSIS — Z950 Presence of cardiac pacemaker: Secondary | ICD-10-CM

## 2012-08-24 LAB — REMOTE PACEMAKER DEVICE
AL AMPLITUDE: 2.8 mv
AL IMPEDENCE PM: 442 Ohm
BATTERY VOLTAGE: 2.8 V
RV LEAD AMPLITUDE: 16 mv
RV LEAD IMPEDENCE PM: 544 Ohm
VENTRICULAR PACING PM: 1

## 2012-09-05 ENCOUNTER — Encounter: Payer: Self-pay | Admitting: Obstetrics and Gynecology

## 2012-09-05 ENCOUNTER — Ambulatory Visit (INDEPENDENT_AMBULATORY_CARE_PROVIDER_SITE_OTHER): Payer: Medicare Other | Admitting: Obstetrics and Gynecology

## 2012-09-05 VITALS — BP 124/76 | Ht 66.0 in | Wt 178.0 lb

## 2012-09-05 DIAGNOSIS — M899 Disorder of bone, unspecified: Secondary | ICD-10-CM

## 2012-09-05 DIAGNOSIS — M8580 Other specified disorders of bone density and structure, unspecified site: Secondary | ICD-10-CM

## 2012-09-05 DIAGNOSIS — N898 Other specified noninflammatory disorders of vagina: Secondary | ICD-10-CM

## 2012-09-05 DIAGNOSIS — B3731 Acute candidiasis of vulva and vagina: Secondary | ICD-10-CM

## 2012-09-05 DIAGNOSIS — N952 Postmenopausal atrophic vaginitis: Secondary | ICD-10-CM

## 2012-09-05 DIAGNOSIS — N951 Menopausal and female climacteric states: Secondary | ICD-10-CM

## 2012-09-05 DIAGNOSIS — L293 Anogenital pruritus, unspecified: Secondary | ICD-10-CM

## 2012-09-05 DIAGNOSIS — R232 Flushing: Secondary | ICD-10-CM

## 2012-09-05 DIAGNOSIS — B373 Candidiasis of vulva and vagina: Secondary | ICD-10-CM

## 2012-09-05 LAB — WET PREP FOR TRICH, YEAST, CLUE: Trich, Wet Prep: NONE SEEN

## 2012-09-05 MED ORDER — ESTRADIOL 0.05 MG/24HR TD PTWK: 1.0000 | MEDICATED_PATCH | TRANSDERMAL | Status: DC

## 2012-09-05 MED ORDER — TERCONAZOLE 0.8 % VA CREA: 1.0000 | TOPICAL_CREAM | Freq: Every day | VAGINAL | Status: DC

## 2012-09-05 NOTE — Progress Notes (Signed)
Patient came to see me today for further followup. She has been having intermittent vaginal itching. She has had recurrent courses of antibiotics for upper respiratory problems. Monistat works but her symptoms come back. She is having no vaginal bleeding. She is having no pelvic pain. She underwent a vaginal hysterectomy in 1989 for endometriosis which was symptomatic. Approximately 40 years ago she had cryosurgery for cervical dysplasia. She has had normal Pap smears since then. Her last Pap smear was 2012. Initially on bone density she had osteopenia. She is worked hard on calcium, vitamin D and exercise and her last bone density 2 years ago was normal with significant improvement of the spine and the hip. She just had a normal mammogram. She remains on oral estradiol for both hot flashes and atrophic vaginitis.  ROS: 12 system review done. Pertinent positives above.Other positives include hypertension, cough.  HEENT: Within normal limits.Kennon Portela present. Neck: No masses. Supraclavicular lymph nodes: Not enlarged. Breasts: Examined in both sitting and lying position. Symmetrical without skin changes or masses. Abdomen: Soft no masses guarding or rebound. No hernias. Pelvic: External within normal limits. BUS within normal limits. Vaginal examination shows good estrogen effect, no cystocele enterocele or rectocele. Cervix and uterus absent. Adnexa within normal limits. Rectovaginal confirmatory. Extremities within normal limits.  Assessment: #1. Menopausal symptoms. #2. Atrophic vaginitis. #3. Yeast vaginitis. #4. Osteopenia now normal. #5. History of cervical dysplasia 40 years ago.  Plan: Continue yearly mammograms. Switched her from estradiol 1 mg to Climara patch 0.05 mg to reduce the risk of DVT.On exam vaginal discharge was consistent with yeast and wet prep was negative. She was treated with terconazole 3 cream.

## 2012-09-05 NOTE — Patient Instructions (Signed)
Continue yearly mammograms 

## 2012-09-15 ENCOUNTER — Encounter: Payer: Self-pay | Admitting: Gastroenterology

## 2012-09-16 ENCOUNTER — Encounter: Payer: Self-pay | Admitting: Obstetrics and Gynecology

## 2012-09-20 ENCOUNTER — Encounter: Payer: Self-pay | Admitting: *Deleted

## 2012-09-26 ENCOUNTER — Encounter: Payer: Self-pay | Admitting: Internal Medicine

## 2012-10-13 ENCOUNTER — Ambulatory Visit (AMBULATORY_SURGERY_CENTER): Payer: Medicare Other | Admitting: *Deleted

## 2012-10-13 VITALS — Ht 65.5 in | Wt 183.2 lb

## 2012-10-13 DIAGNOSIS — Z1211 Encounter for screening for malignant neoplasm of colon: Secondary | ICD-10-CM

## 2012-10-13 MED ORDER — NA SULFATE-K SULFATE-MG SULF 17.5-3.13-1.6 GM/177ML PO SOLN: ORAL | Status: DC

## 2012-10-22 IMAGING — CR DG CHEST 2V
2 series · 2 of 2 positions shown · non-contrast
Comparison: 01/21/2012.

CLINICAL DATA: History of coughing.  History of previous smoking.
Follow up pneumonia.

CHEST - 2 VIEW

[view not recorded (1 of 2)]
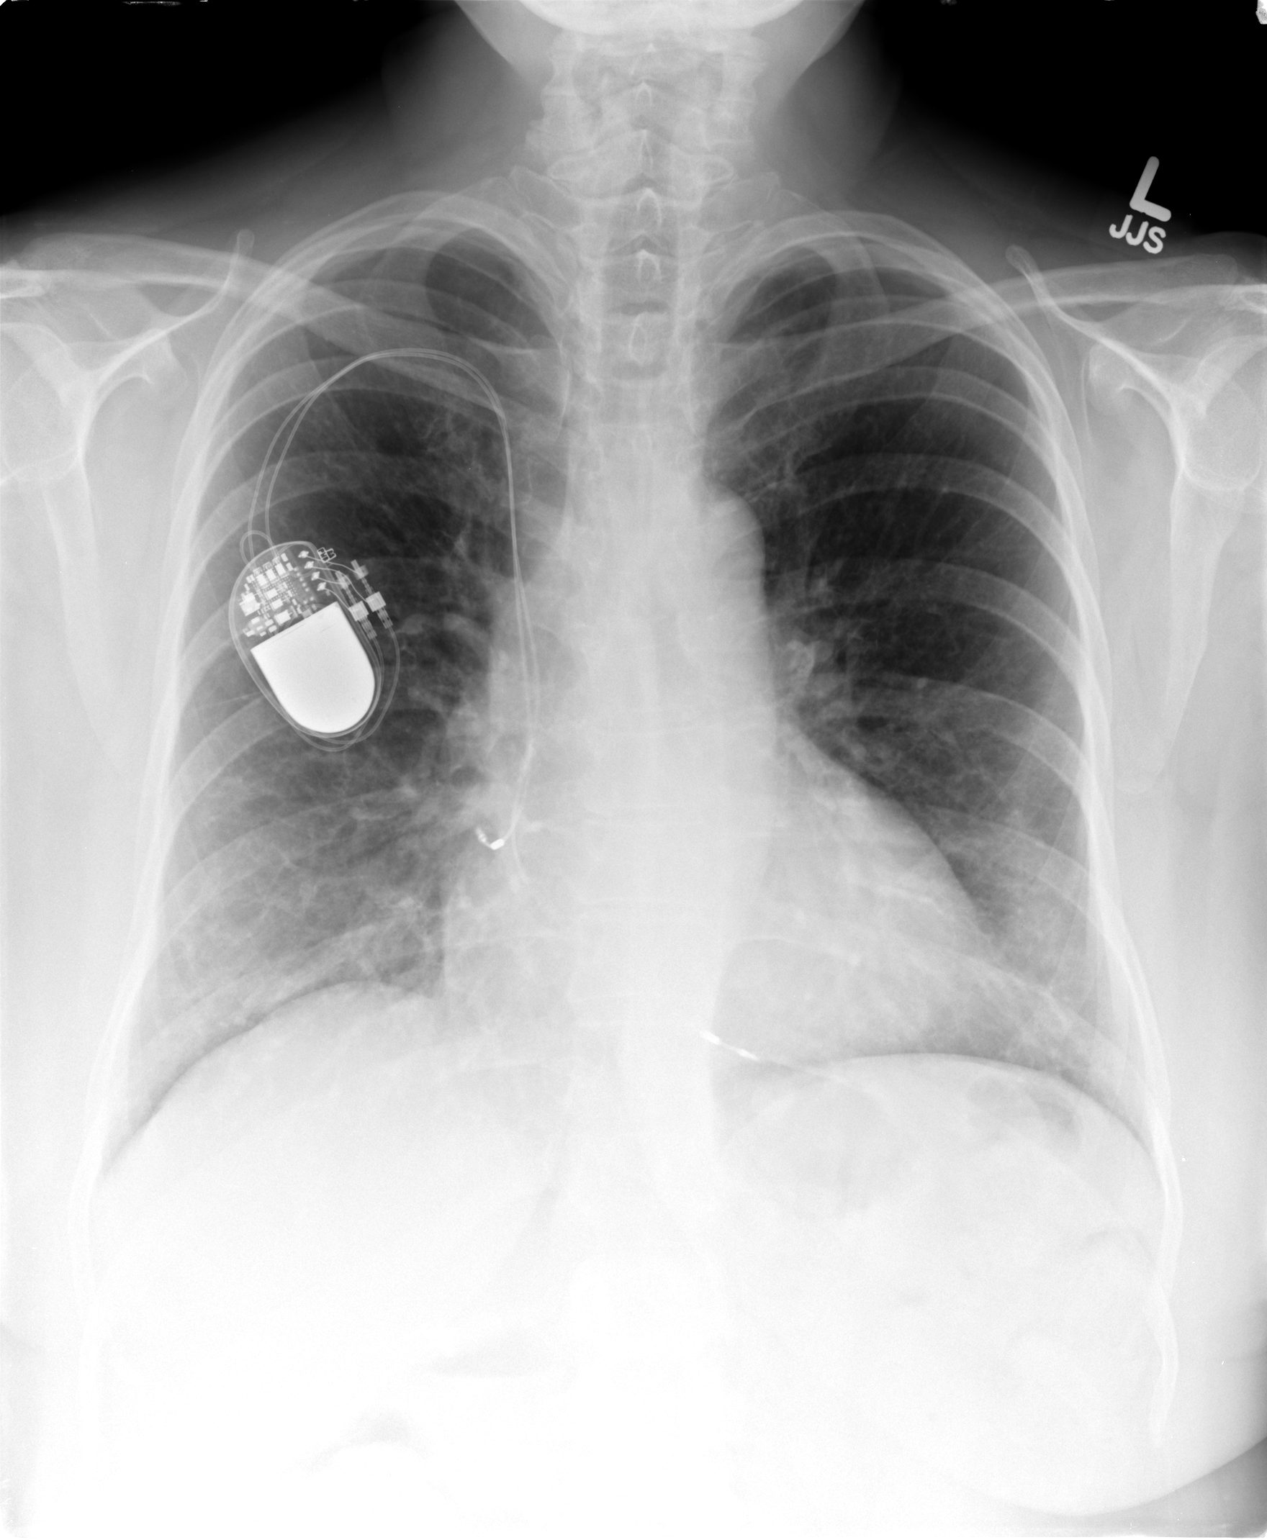

[view not recorded (2 of 2)]
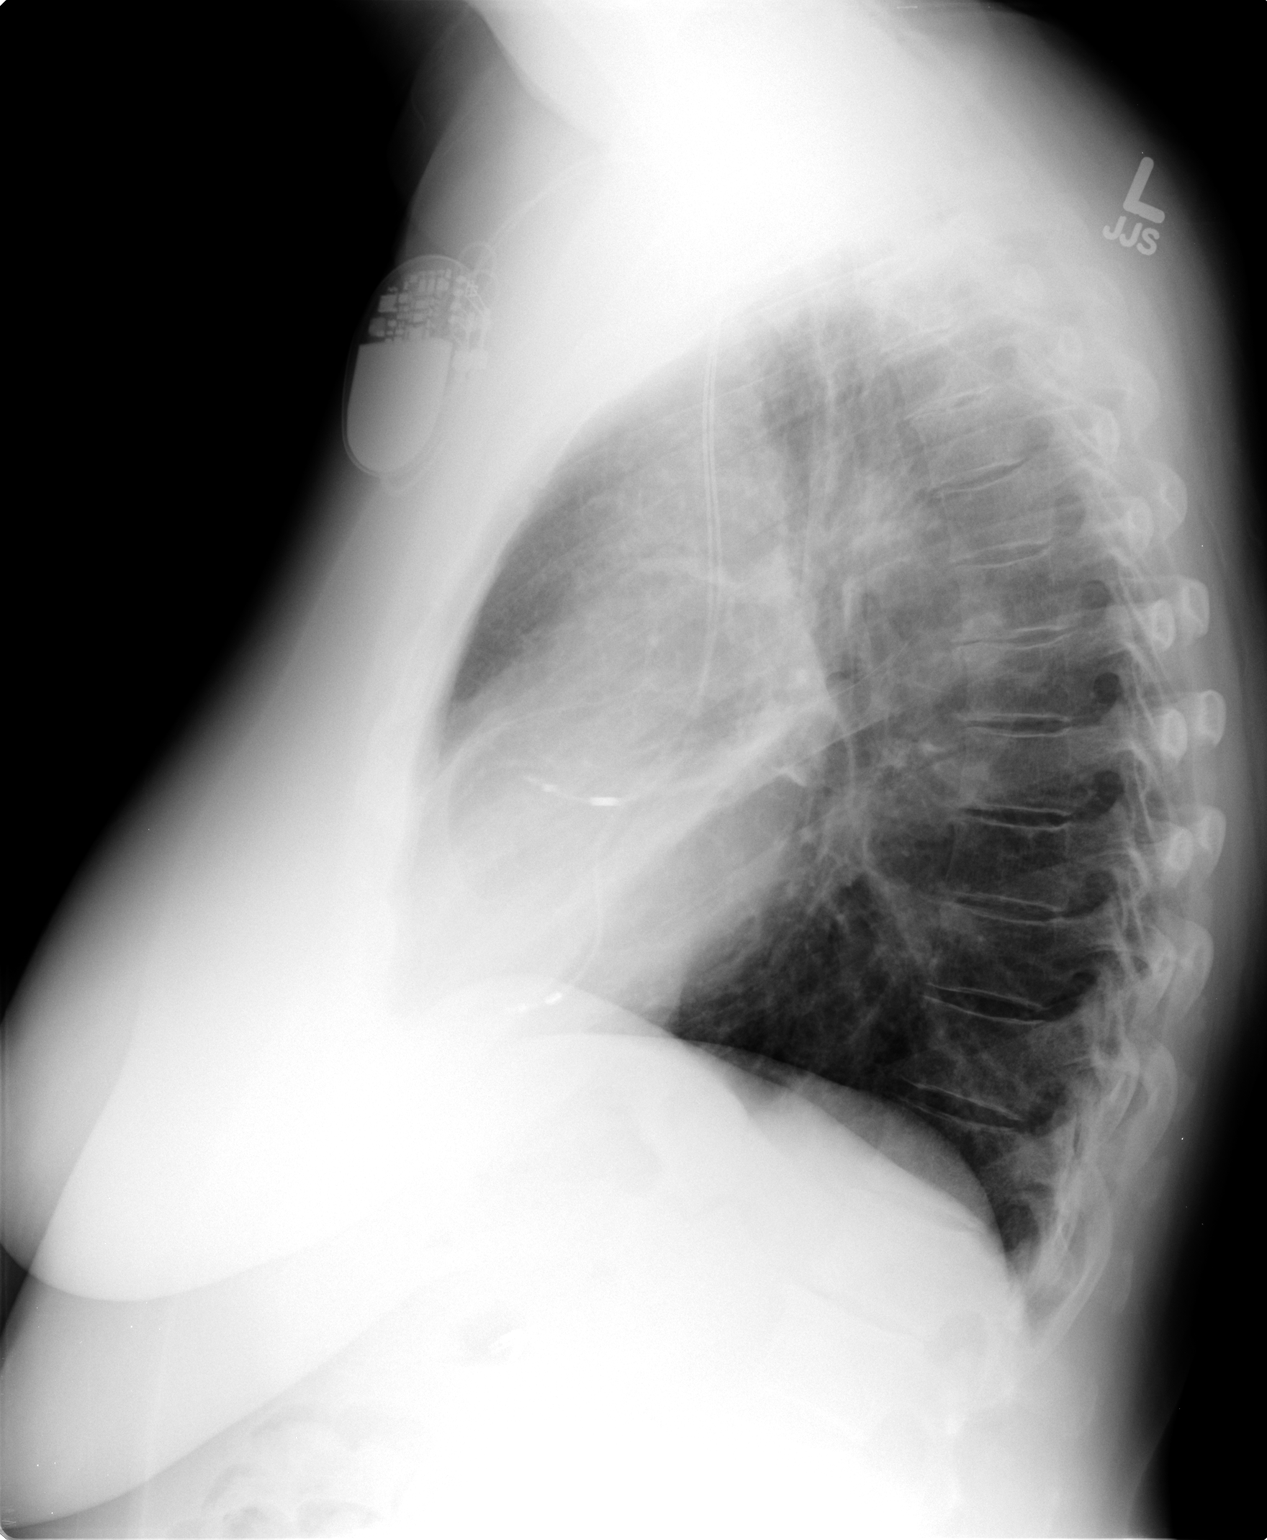

[2 of 2 positions shown; findings below may reference images not displayed]

FINDINGS: There is stable moderate cardiac silhouette enlargement.
Dual lead transvenous pacemaker is in place with controller device
on the right.  There is hazy infiltrative density in the left base.
This was present on the previous study.  There is slight loss of a
portion of the cardiac margin on the PA image.  No consolidation or
pleural effusion is seen.  No new infiltrates are evident.  There
is osteopenic appearance of the bones.  Changes of degenerative
disc disease and degenerative spondylosis are present.
IMPRESSION: Stable cardiac silhouette enlargement.  Pacemaker in place.
 There is hazy infiltrative density in the left base.  This was
present on the previous study.  There is slight loss of a portion
of the cardiac margin on the PA image.  No consolidation or pleural
effusion is seen. This density could reflect atelectasis, fibrosis,
pneumonitis, and / or pleural thickening.  Clearing has not
occurred.  History given of persistent coughing and previous
smoking.  CT of the chest could be performed for additional
characterization and evaluation.

## 2012-10-23 IMAGING — CT CT CHEST W/ CM
2 of 4 series · 15 of 36 positions shown, 18 images · IV contrast (Omnipaque 300)
Comparison: Chest x-ray 02/11/2012

CLINICAL DATA: Follow-up for pneumonia [DATE].  Still has
productive cough, lethargy.  Evaluate for mass.

CT CHEST WITH CONTRAST
TECHNIQUE: Multidetector CT imaging of the chest was performed
following the standard protocol during bolus administration of
intravenous contrast.
Contrast: 80mL OMNIPAQUE IOHEXOL 300 MG/ML  SOLN

[Series 2: chest routine with · axial · 0.70mm/px · z∈[-289,-49]mm · 12 of 58 slices shown, 15 images]
[im 5/58  mediastinal]
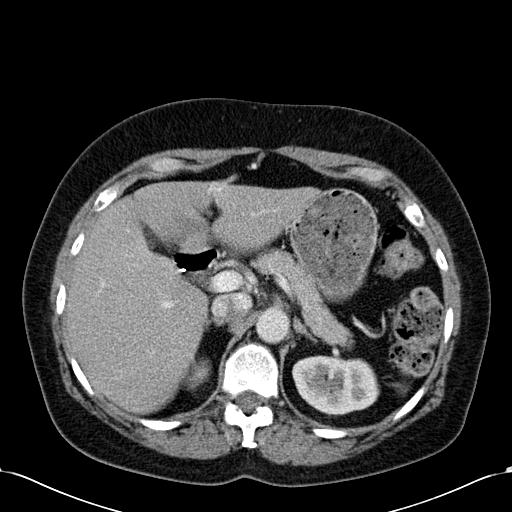
[im 5/58  lung]
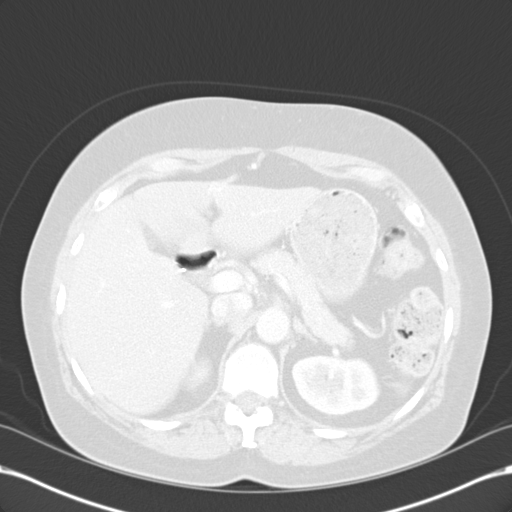
[im 9/58  lung]
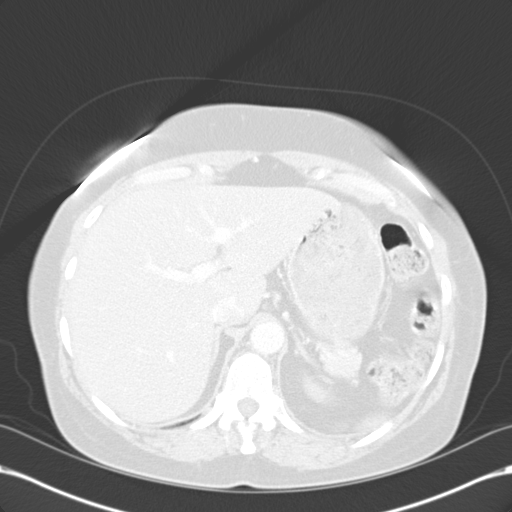
[im 14/58  lung]
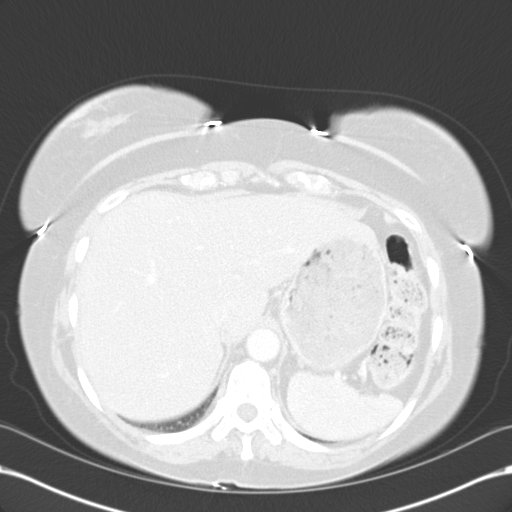
[im 18/58  lung]
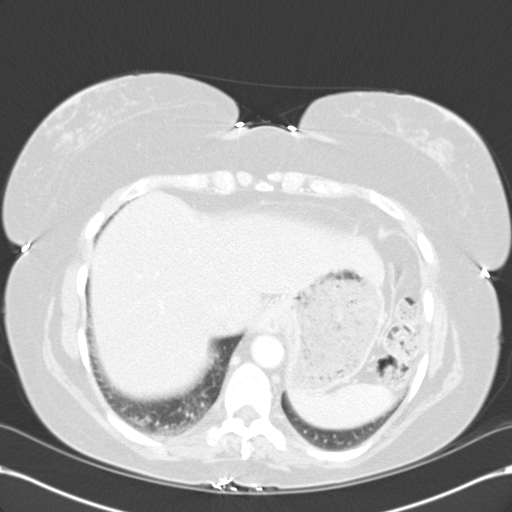
[im 22/58  mediastinal]
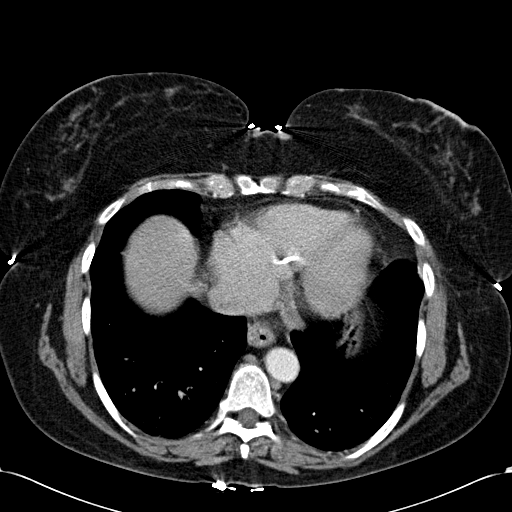
[im 22/58  lung]
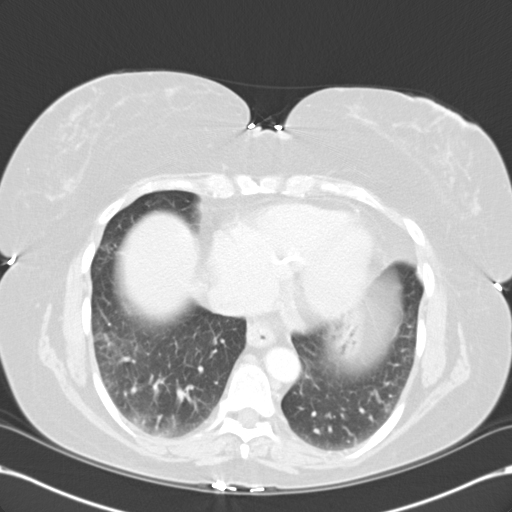
[im 27/58  lung]
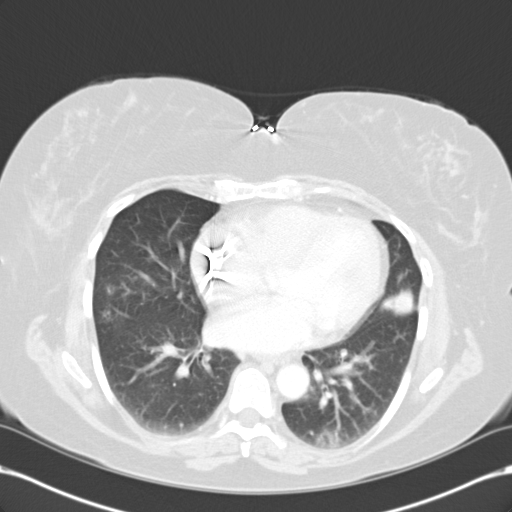
[im 31/58  lung]
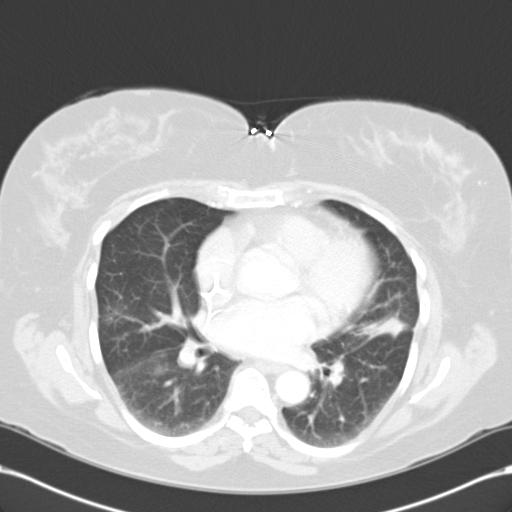
[im 36/58  lung]
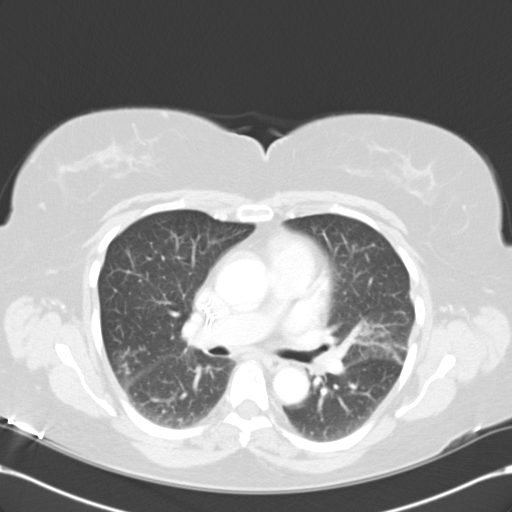
[im 40/58  mediastinal]
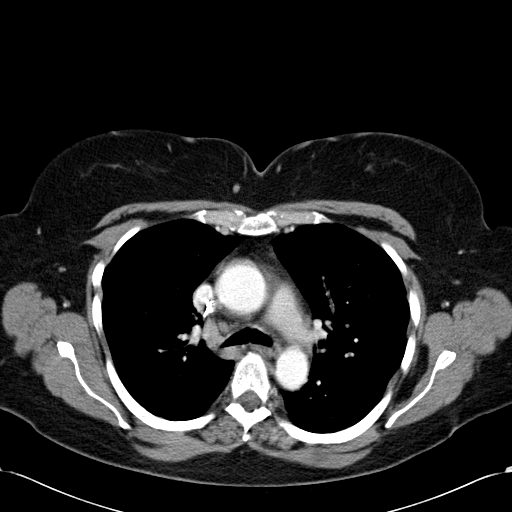
[im 40/58  lung]
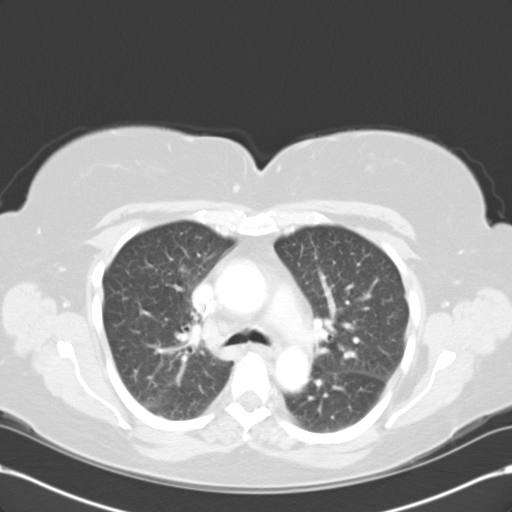
[im 44/58  lung]
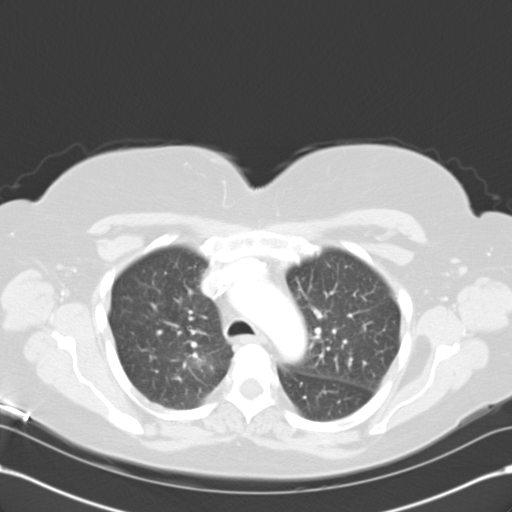
[im 49/58  lung]
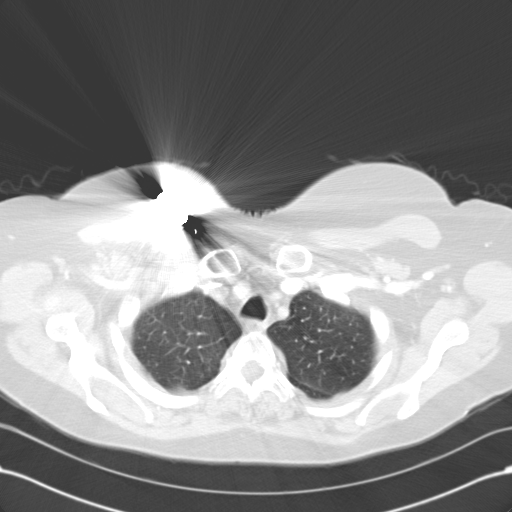
[im 53/58  lung]
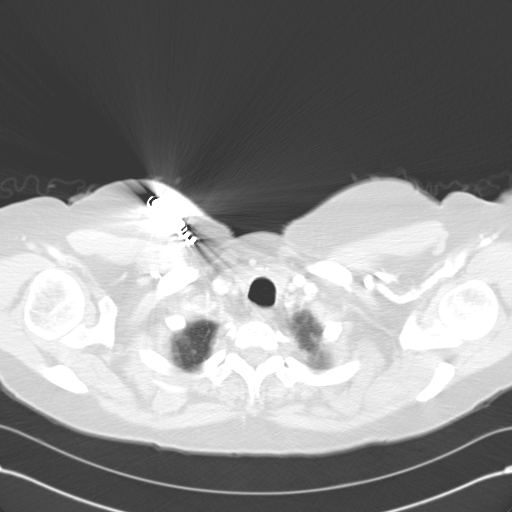

[Series 602: cor · coronal · 0.70mm/px · 3 of 93 slices shown]
[im 19/93  lung]
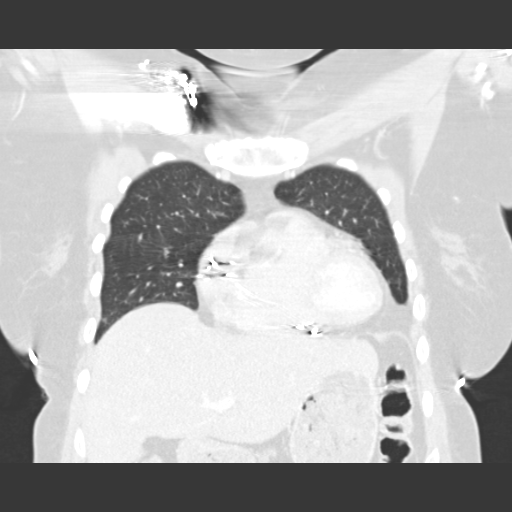
[im 37/93  lung]
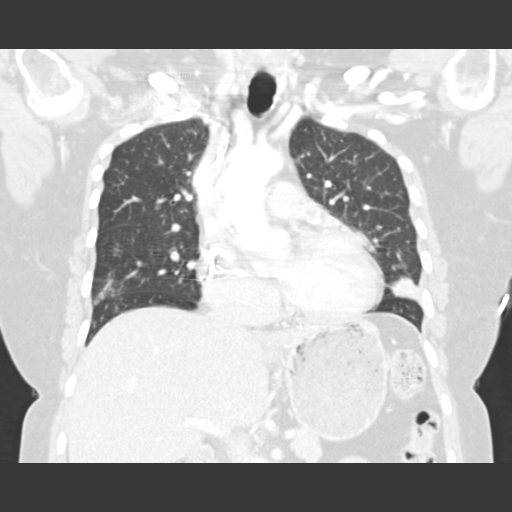
[im 56/93  lung]
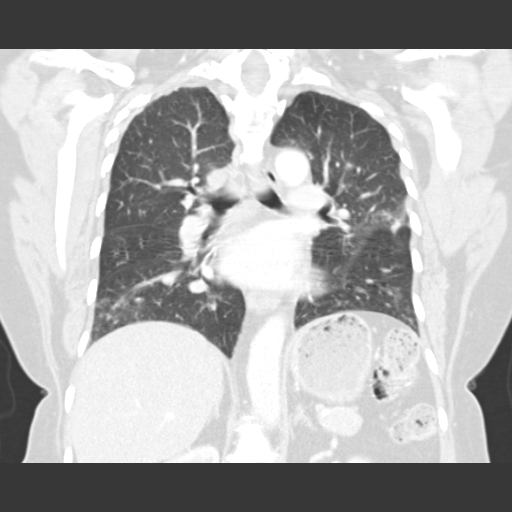

[15 of 36 positions shown; findings below may reference images not displayed]

FINDINGS: The patient has right-sided transvenous pacemaker, leads
to right atrium and right ventricle.  Heart size is normal.  No
evidence for pulmonary edema.

Lung windows demonstrate a longitudinal area of opacity along the
posterior aspect of the lingula. Area measures 7.5 x 1.4 cm maximum
diameter and abuts the major fissure.  Density is pleural based and
favors inflammatory or post inflammatory process.  There are other
scattered areas of pulmonary opacity bilaterally, consistent with
resolving or minimal inflammatory infectious process.  There are no
pleural effusions. No mediastinal, hilar, or axillary adenopathy.
The visualized portion of the thyroid gland has a normal
appearance.

Images of the upper abdomen are unremarkable.  There are
degenerative changes in the spine, associated with focal areas of
sclerosis, also felt to be degenerative.
IMPRESSION: 1.  Longitudinal area of density within the lingular segment of the
left upper lobe.  Findings are favored to be inflammatory or
postinflammatory.
2.  No evidence for adenopathy.  No pleural effusions.
3.  No cardiomegaly or pulmonary edema.
4.  Patchy areas of pulmonary opacity bilaterally, most consistent
with infectious process.
5. Follow-up PA and lateral chest x-ray is recommended to document
resolution.

## 2012-11-01 ENCOUNTER — Encounter: Payer: Self-pay | Admitting: Gastroenterology

## 2012-11-01 ENCOUNTER — Ambulatory Visit (AMBULATORY_SURGERY_CENTER): Payer: Medicare Other | Admitting: Gastroenterology

## 2012-11-01 VITALS — BP 126/77 | HR 62 | Temp 98.0°F | Resp 15 | Ht 65.5 in | Wt 183.0 lb

## 2012-11-01 DIAGNOSIS — Z1211 Encounter for screening for malignant neoplasm of colon: Secondary | ICD-10-CM

## 2012-11-01 DIAGNOSIS — K648 Other hemorrhoids: Secondary | ICD-10-CM

## 2012-11-01 MED ORDER — SODIUM CHLORIDE 0.9 % IV SOLN: 500.0000 mL | INTRAVENOUS | Status: DC

## 2012-11-01 NOTE — Patient Instructions (Signed)
YOU HAD AN ENDOSCOPIC PROCEDURE TODAY AT THE [facility]: Refer to the procedure report that was given to you for any specific questions about what was found during the examination.  If the procedure report does not answer your questions, please call your gastroenterologist to clarify.  If you requested that your care partner not be given the details of your procedure findings, then the procedure report has been included in a sealed envelope for you to review at your convenience later.  YOU SHOULD EXPECT: Some feelings of bloating in the abdomen. Passage of more gas than usual.  Walking can help get rid of the air that was put into your GI tract during the procedure and reduce the bloating. If you had a lower endoscopy (such as a colonoscopy or flexible sigmoidoscopy) you may notice spotting of blood in your stool or on the toilet paper. If you underwent a bowel prep for your procedure, then you may not have a normal bowel movement for a few days.  DIET: Your first meal following the procedure should be a light meal and then it is ok to progress to your normal diet.  A half-sandwich or bowl of soup is an example of a good first meal.  Heavy or fried foods are harder to digest and may make you feel nauseous or bloated.  Likewise meals heavy in dairy and vegetables can cause extra gas to form and this can also increase the bloating.  Drink plenty of fluids but you should avoid alcoholic beverages for 24 hours.  ACTIVITY: Your care partner should take you home directly after the procedure.  You should plan to take it easy, moving slowly for the rest of the day.  You can resume normal activity the day after the procedure however you should NOT DRIVE or use heavy machinery for 24 hours (because of the sedation medicines used during the test).    SYMPTOMS TO REPORT IMMEDIATELY: A gastroenterologist can be reached at any hour.  During normal business hours, 8:30 AM to 5:00 PM Monday through Friday,  call [phone].  After hours and on weekends, please call the GI answering service at [phone] who will take a message and have the physician on call contact you.   Following lower endoscopy (colonoscopy or flexible sigmoidoscopy):  Excessive amounts of blood in the stool  Significant tenderness or worsening of abdominal pains  Swelling of the abdomen that is new, acute  Fever of 100F or higher    FOLLOW UP: If any biopsies were taken you will be contacted by phone or by letter within the next 1-3 weeks.  Call your gastroenterologist if you have not heard about the biopsies in 3 weeks.  Our staff will call the home number listed on your records the next business day following your procedure to check on you and address any questions or concerns that you may have at that time regarding the information given to you following your procedure. This is a courtesy call and so if there is no answer at the home number and we have not heard from you through the emergency physician on call, we will assume that you have returned to your regular daily activities without incident.  SIGNATURES/CONFIDENTIALITY: You and/or your care partner have signed paperwork which will be entered into your electronic medical record.  These signatures attest to the fact that that the information above on your After Visit Summary has been reviewed and is understood.  Full responsibility of the confidentiality   of this discharge information lies with you and/or your care-partner.     

## 2012-11-01 NOTE — Progress Notes (Signed)
1431 a/o x3 pleased with MAC report to [employer]

## 2012-11-01 NOTE — Progress Notes (Signed)
Patient did not experience any of the following events: a burn prior to discharge; a fall within the facility; wrong site/side/patient/procedure/implant event; or a hospital transfer or hospital admission upon discharge from the facility. (G8907) Patient did not have preoperative order for IV antibiotic SSI prophylaxis. (G8918)  

## 2012-11-01 NOTE — Op Note (Signed)
 [facility] [address] [place], [address]   COLONOSCOPY PROCEDURE REPORT  PATIENT: Judy, Chapman  MR#: [id number] BIRTHDATE: 1946-06-17 , 66  yrs. old GENDER: Female ENDOSCOPIST: Louis Meckel, MD REFERRED VH:QION Perini, M.D. PROCEDURE DATE:  11/01/2012 PROCEDURE:   Colonoscopy, diagnostic ASA CLASS:   Class II INDICATIONS:average risk screening. MEDICATIONS: MAC sedation, administered by CRNA and Propofol (Diprivan) 270 mg IV  DESCRIPTION OF PROCEDURE:   After the risks benefits and alternatives of the procedure were thoroughly explained, informed consent was obtained.  A digital rectal exam revealed no abnormalities of the rectum.   The LB CF-[id number]  endoscope was introduced through the anus and advanced to the cecum, which was identified by the ileocecal valve. No adverse events experienced.   The quality of the prep was Suprep excellent  The instrument was then slowly withdrawn as the colon was fully examined.      COLON FINDINGS: Internal hemorrhoids were found.   The colon mucosa was otherwise normal.  Retroflexed views revealed no abnormalities. The time to cecum=4 minutes 41 seconds.  Withdrawal time=8 minutes 48 seconds.  The scope was withdrawn and the procedure completed. COMPLICATIONS: There were no complications.  ENDOSCOPIC IMPRESSION: 1.   Internal hemorrhoids 2.   The colon mucosa was otherwise normal  RECOMMENDATIONS: Continue current colorectal screening recommendations for "routine risk" patients with a repeat colonoscopy in 10 years.   eSigned:  Louis Meckel, MD 11/01/2012 2:29 PM   cc:

## 2012-11-02 ENCOUNTER — Telehealth: Payer: Self-pay | Admitting: *Deleted

## 2012-11-02 NOTE — Telephone Encounter (Signed)
  Follow up Call-  Call back number 11/01/2012  Post procedure Call Back phone  # [phone]  Permission to leave phone message Yes     Patient questions:  Do you have a fever, pain , or abdominal swelling? no Pain Score  0 *  Have you tolerated food without any problems? yes  Have you been able to return to your normal activities? yes  Do you have any questions about your discharge instructions: Diet   no Medications  no Follow up visit  no  Do you have questions or concerns about your Care? no  Actions: * If pain score is 4 or above: No action needed, pain <4.

## 2012-11-25 ENCOUNTER — Other Ambulatory Visit: Payer: Self-pay | Admitting: Obstetrics and Gynecology

## 2012-11-28 ENCOUNTER — Ambulatory Visit (INDEPENDENT_AMBULATORY_CARE_PROVIDER_SITE_OTHER): Payer: Medicare Other | Admitting: *Deleted

## 2012-11-28 DIAGNOSIS — Z950 Presence of cardiac pacemaker: Secondary | ICD-10-CM

## 2012-11-28 DIAGNOSIS — I495 Sick sinus syndrome: Secondary | ICD-10-CM

## 2012-11-29 ENCOUNTER — Other Ambulatory Visit: Payer: Self-pay

## 2012-12-08 LAB — REMOTE PACEMAKER DEVICE
AL AMPLITUDE: 2.8 mv
AL IMPEDENCE PM: 481 Ohm
BATTERY VOLTAGE: 2.8 V
RV LEAD AMPLITUDE: 16 mv

## 2012-12-20 ENCOUNTER — Encounter: Payer: Self-pay | Admitting: *Deleted

## 2012-12-26 ENCOUNTER — Encounter: Payer: Self-pay | Admitting: Internal Medicine

## 2013-02-08 ENCOUNTER — Encounter: Payer: Self-pay | Admitting: Internal Medicine

## 2013-02-08 ENCOUNTER — Ambulatory Visit (INDEPENDENT_AMBULATORY_CARE_PROVIDER_SITE_OTHER): Payer: Medicare Other | Admitting: Internal Medicine

## 2013-02-08 VITALS — BP 127/86 | HR 75 | Wt 182.2 lb

## 2013-02-08 DIAGNOSIS — I495 Sick sinus syndrome: Secondary | ICD-10-CM | POA: Insufficient documentation

## 2013-02-08 DIAGNOSIS — Z950 Presence of cardiac pacemaker: Secondary | ICD-10-CM

## 2013-02-08 LAB — PACEMAKER DEVICE OBSERVATION
AL IMPEDENCE PM: 459 Ohm
AL THRESHOLD: 0.75 V
ATRIAL PACING PM: 36
RV LEAD AMPLITUDE: 8 mv
RV LEAD IMPEDENCE PM: 509 Ohm
RV LEAD THRESHOLD: 0.75 V

## 2013-02-08 NOTE — Assessment & Plan Note (Signed)
35% atrial pacing

## 2013-02-08 NOTE — Patient Instructions (Addendum)
Your physician wants you to follow-up in: ONE YEAR WITH DR KLEIN You will receive a reminder letter in the mail two months in advance. If you don't receive a letter, please call our office to schedule the follow-up appointment.    

## 2013-02-08 NOTE — Assessment & Plan Note (Signed)
The patient's device was interrogated.  The information was reviewed. No changes were made in the programming.    

## 2013-02-08 NOTE — Progress Notes (Signed)
Patient Care Team: Ezequiel Kayser, MD as PCP - General (Internal Medicine)   HPI  Judy Chapman is a 67 y.o. 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 He was initially implanted in 2002 with device generator replacement March 2010. This was done by Dr. Deborah Chalk.   She has a history of hypertension and had been on ACE inhibitors. She apparently had angioedema.the ReoPro was discontinued and the symptoms of her throat closing had stopped  She also is in her frequent and intermittent spells of flushing. The cause of his has not been elucidated.      Past Medical History  Diagnosis Date  . PVC (premature ventricular contraction)   . HTN (hypertension)   . Endometriosis   . Osteopenia   . Sinus node dysfunction   . Pacemaker-MDT     DOI-2002, Generator change 2010  . Pneumonia     Past Surgical History  Procedure Laterality Date  . Coronary angioplasty    . Pacemaker insertion      [employer]  . Vaginal hysterectomy    . Gallbladder surgery    . Tubal ligation    . Dilation and curettage of uterus    . Hysteroscopy    . Breast lumpectomy      Benign    Current Outpatient Prescriptions  Medication Sig Dispense Refill  . amLODipine (NORVASC) 5 MG tablet Take 5 mg by mouth daily.       Marland Kitchen aspirin 81 MG tablet Take 81 mg by mouth daily.        . calcium carbonate (OS-CAL) 600 MG TABS Take 600 mg by mouth 2 (two) times daily with a meal.        . citalopram (CELEXA) 20 MG tablet Take 1 tablet (20 mg total) by mouth daily.  90 tablet  2  . estradiol (CLIMARA - DOSED IN MG/24 HR) 0.05 mg/24hr Place 1 patch (0.05 mg total) onto the skin once a week.  13 patch  2  . nebivolol (BYSTOLIC) 10 MG tablet Take 10 mg by mouth daily.      Marland Kitchen triamterene-hydrochlorothiazide (DYAZIDE) 37.5-25 MG per capsule Take 1 capsule by mouth daily.       No current facility-administered medications for this visit.    Allergies  Allergen Reactions  .  Augmentin (Amoxicillin-Pot Clavulanate)     diarrhea  . Ramipril Swelling    Review of Systems negative except from HPI and PMH  Physical Exam BP 127/86  Pulse 75  Wt 182 lb 4 oz (82.668 kg)  BMI 29.86 kg/m2 Well developed and well nourished in no acute distress HENT normal E scleral and icterus clear Neck Supple JVP flat; carotids brisk and full Clear to ausculation Device pocket well healed; without hematoma or erythema Regular rate and rhythm, no murmurs gallops or rub Soft with active bowel sounds No clubbing cyanosis none Edema Alert and oriented, grossly normal motor and sensory function Skin Warm and Dry  ECG demonstrates atrial pacing at 64 intervals 20/09/42 Low-voltage release  Assessment and  Plan

## 2013-02-09 ENCOUNTER — Encounter: Payer: Medicare Other | Admitting: Internal Medicine

## 2013-05-15 ENCOUNTER — Encounter: Payer: Medicare Other | Admitting: *Deleted

## 2013-05-23 ENCOUNTER — Encounter: Payer: Self-pay | Admitting: *Deleted

## 2013-05-29 ENCOUNTER — Ambulatory Visit (INDEPENDENT_AMBULATORY_CARE_PROVIDER_SITE_OTHER): Payer: Medicare Other | Admitting: *Deleted

## 2013-05-29 DIAGNOSIS — I495 Sick sinus syndrome: Secondary | ICD-10-CM

## 2013-05-29 DIAGNOSIS — Z950 Presence of cardiac pacemaker: Secondary | ICD-10-CM

## 2013-06-07 LAB — REMOTE PACEMAKER DEVICE
AL IMPEDENCE PM: 487 Ohm
ATRIAL PACING PM: 68
BAMS-0001: 175 {beats}/min
BATTERY VOLTAGE: 2.8 V
RV LEAD AMPLITUDE: 16 mv
VENTRICULAR PACING PM: 7

## 2013-06-27 ENCOUNTER — Encounter: Payer: Self-pay | Admitting: *Deleted

## 2013-06-29 ENCOUNTER — Ambulatory Visit (INDEPENDENT_AMBULATORY_CARE_PROVIDER_SITE_OTHER): Payer: Medicare Other | Admitting: Women's Health

## 2013-06-29 ENCOUNTER — Encounter: Payer: Self-pay | Admitting: Women's Health

## 2013-06-29 DIAGNOSIS — N390 Urinary tract infection, site not specified: Secondary | ICD-10-CM

## 2013-06-29 DIAGNOSIS — R350 Frequency of micturition: Secondary | ICD-10-CM

## 2013-06-29 LAB — URINALYSIS W MICROSCOPIC + REFLEX CULTURE
Crystals: NONE SEEN
Ketones, ur: NEGATIVE mg/dL
Nitrite: NEGATIVE
Specific Gravity, Urine: 1.02 (ref 1.005–1.030)
Urobilinogen, UA: 0.2 mg/dL (ref 0.0–1.0)
pH: 6.5 (ref 5.0–8.0)

## 2013-06-29 MED ORDER — CIPROFLOXACIN HCL 250 MG PO TABS: 250.0000 mg | ORAL_TABLET | Freq: Two times a day (BID) | ORAL | Status: DC

## 2013-06-29 NOTE — Progress Notes (Signed)
Patient ID: LATEASHA BREUER, female   DOB: 1946/03/22, 67 y.o.   MRN: [id number] Presents with complaint of increased urinary frequency with urgency and low back pain. Denies painful urination, burning, fever, vaginal discharge. Reports having frequent UTIs in the past, none in the past 5 years. Hypertension on 3 medications, pacemaker.  Exam: Appears well. UA: Trace leukocytes, 11-20 WBCs many bacteria. No CVAT.  UTI  Plan: Cipro 250 twice daily for 3 days #6 prescription, proper use given and reviewed. Instructed to call if no relief of symptoms. Culture pending.

## 2013-06-29 NOTE — Patient Instructions (Addendum)
Urinary Tract Infection  Urinary tract infections (UTIs) can develop anywhere along your urinary tract. Your urinary tract is your body's drainage system for removing wastes and extra water. Your urinary tract includes two kidneys, two ureters, a bladder, and a urethra. Your kidneys are a pair of bean-shaped organs. Each kidney is about the size of your fist. They are located below your ribs, one on each side of your spine.  CAUSES  Infections are caused by microbes, which are microscopic organisms, including fungi, viruses, and bacteria. These organisms are so small that they can only be seen through a microscope. Bacteria are the microbes that most commonly cause UTIs.  SYMPTOMS   Symptoms of UTIs may vary by age and gender of the patient and by the location of the infection. Symptoms in young women typically include a frequent and intense urge to urinate and a painful, burning feeling in the bladder or urethra during urination. Older women and men are more likely to be tired, shaky, and weak and have muscle aches and abdominal pain. A fever may mean the infection is in your kidneys. Other symptoms of a kidney infection include pain in your back or sides below the ribs, nausea, and vomiting.  DIAGNOSIS  To diagnose a UTI, your caregiver will ask you about your symptoms. Your caregiver also will ask to provide a urine sample. The urine sample will be tested for bacteria and white blood cells. White blood cells are made by your body to help fight infection.  TREATMENT   Typically, UTIs can be treated with medication. Because most UTIs are caused by a bacterial infection, they usually can be treated with the use of antibiotics. The choice of antibiotic and length of treatment depend on your symptoms and the type of bacteria causing your infection.  HOME CARE INSTRUCTIONS   If you were prescribed antibiotics, take them exactly as your caregiver instructs you. Finish the medication even if you feel better after you  have only taken some of the medication.   Drink enough water and fluids to keep your urine clear or pale yellow.   Avoid caffeine, tea, and carbonated beverages. They tend to irritate your bladder.   Empty your bladder often. Avoid holding urine for long periods of time.   Empty your bladder before and after sexual intercourse.   After a bowel movement, women should cleanse from front to back. Use each tissue only once.  SEEK MEDICAL CARE IF:    You have back pain.   You develop a fever.   Your symptoms do not begin to resolve within 3 days.  SEEK IMMEDIATE MEDICAL CARE IF:    You have severe back pain or lower abdominal pain.   You develop chills.   You have nausea or vomiting.   You have continued burning or discomfort with urination.  MAKE SURE YOU:    Understand these instructions.   Will watch your condition.   Will get help right away if you are not doing well or get worse.  Document Released: 07/22/2005 Document Revised: 04/12/2012 Document Reviewed: 11/20/2011  ExitCare Patient Information 2014 [employer].

## 2013-06-29 NOTE — Addendum Note (Signed)
Addended by: Harrington Challenger on: 06/29/2013 04:05 PM   Modules accepted: Level of Service

## 2013-07-04 ENCOUNTER — Other Ambulatory Visit: Payer: Self-pay | Admitting: Women's Health

## 2013-07-04 DIAGNOSIS — N390 Urinary tract infection, site not specified: Secondary | ICD-10-CM

## 2013-07-05 ENCOUNTER — Other Ambulatory Visit: Payer: Self-pay | Admitting: Women's Health

## 2013-07-05 DIAGNOSIS — N390 Urinary tract infection, site not specified: Secondary | ICD-10-CM

## 2013-07-05 MED ORDER — CIPROFLOXACIN HCL 250 MG PO TABS: 250.0000 mg | ORAL_TABLET | Freq: Two times a day (BID) | ORAL | Status: DC

## 2013-07-19 ENCOUNTER — Other Ambulatory Visit: Payer: Self-pay

## 2013-07-19 DIAGNOSIS — Z1231 Encounter for screening mammogram for malignant neoplasm of breast: Secondary | ICD-10-CM

## 2013-07-20 ENCOUNTER — Other Ambulatory Visit: Payer: Medicare Other

## 2013-07-20 DIAGNOSIS — N390 Urinary tract infection, site not specified: Secondary | ICD-10-CM

## 2013-07-21 LAB — URINALYSIS W MICROSCOPIC + REFLEX CULTURE
Bacteria, UA: NONE SEEN
Bilirubin Urine: NEGATIVE
Casts: NONE SEEN
Crystals: NONE SEEN
Glucose, UA: NEGATIVE mg/dL
Ketones, ur: NEGATIVE mg/dL
Nitrite: NEGATIVE
Specific Gravity, Urine: 1.015 (ref 1.005–1.030)
Squamous Epithelial / LPF: NONE SEEN

## 2013-08-01 ENCOUNTER — Encounter: Payer: Self-pay | Admitting: Internal Medicine

## 2013-08-13 ENCOUNTER — Other Ambulatory Visit: Payer: Self-pay | Admitting: Obstetrics and Gynecology

## 2013-08-23 ENCOUNTER — Ambulatory Visit
Admission: RE | Admit: 2013-08-23 | Discharge: 2013-08-23 | Disposition: A | Discharge status: Home / Self Care | Payer: Medicare Other | Source: Ambulatory Visit

## 2013-08-23 ENCOUNTER — Ambulatory Visit: Payer: Medicare Other

## 2013-08-23 DIAGNOSIS — Z1231 Encounter for screening mammogram for malignant neoplasm of breast: Secondary | ICD-10-CM

## 2013-09-04 ENCOUNTER — Ambulatory Visit (INDEPENDENT_AMBULATORY_CARE_PROVIDER_SITE_OTHER): Payer: Medicare Other | Admitting: *Deleted

## 2013-09-04 DIAGNOSIS — I495 Sick sinus syndrome: Secondary | ICD-10-CM

## 2013-09-04 LAB — MDC_IDC_ENUM_SESS_TYPE_REMOTE
Battery Impedance: 180 Ohm
Battery Remaining Longevity: 138 mo
Battery Voltage: 2.8 V
Brady Statistic AS VS Percent: 20 %
Lead Channel Impedance Value: 493 Ohm
Lead Channel Impedance Value: 582 Ohm
Lead Channel Pacing Threshold Amplitude: 0.625 V
Lead Channel Pacing Threshold Pulse Width: 0.4 ms
Lead Channel Setting Pacing Amplitude: 2 V
Lead Channel Setting Pacing Amplitude: 2.5 V
Lead Channel Setting Pacing Pulse Width: 0.4 ms
Lead Channel Setting Sensing Sensitivity: 2.8 mV

## 2013-09-12 ENCOUNTER — Ambulatory Visit (INDEPENDENT_AMBULATORY_CARE_PROVIDER_SITE_OTHER): Payer: Medicare Other | Admitting: Women's Health

## 2013-09-12 ENCOUNTER — Encounter: Payer: Self-pay | Admitting: Women's Health

## 2013-09-12 VITALS — BP 112/70 | Ht 65.25 in | Wt 184.8 lb

## 2013-09-12 DIAGNOSIS — Z79890 Hormone replacement therapy (postmenopausal): Secondary | ICD-10-CM

## 2013-09-12 MED ORDER — ESTRADIOL 0.05 MG/24HR TD PTTW: 1.0000 | MEDICATED_PATCH | TRANSDERMAL | Status: DC

## 2013-09-12 NOTE — Progress Notes (Signed)
Judy Chapman [birth year] [id number]    History:    The patient presents for breast and pelvic exam. 1998 TVH for endometriosis  on Climara patch. Normal DEXA 08/2010 T score left femoral neck -0.6. Had a traumatic fall in the bathtub one week ago with no fractures. Normal Pap and mammogram history. Negative colonoscopy. Current on immunizations.  Past medical history, past surgical history, family history and social history were all reviewed and documented in the EPIC chart. Retired Chartered loss adjuster. Granddaughter lives in [place] with 2 children other daughter lives here with 2 children.    Filed Vitals:   09/12/13 1101  BP: 112/70    General appearance:  Normal Head/Neck:  Normal, without cervical or supraclavicular adenopathy. Thyroid:  Symmetrical, normal in size, without palpable masses or nodularity. Respiratory  Effort:  Normal  Auscultation:  Clear without wheezing or rhonchi Cardiovascular  Auscultation:  Regular rate, without rubs, murmurs or gallops  Edema/varicosities:  Not grossly evident Abdominal  Soft,nontender, without masses, guarding or rebound.  Liver/spleen:  No organomegaly noted  Hernia:  None appreciated  Skin  Inspection:  Grossly normal  Palpation:  Grossly normal Neurologic/psychiatric  Orientation:  Normal with appropriate conversation.  Mood/affect:  Normal  Genitourinary    Breasts: Examined lying and sitting.     Right: Without masses, retractions, discharge or axillary adenopathy.     Left: Without masses, retractions, discharge or axillary adenopathy. Nipple missing, had a mole removed as a child   Inguinal/mons:  Normal without inguinal adenopathy  External genitalia:  Normal  BUS/Urethra/Skene's glands:  Normal  Bladder:  Normal  Vagina:  Normal  Cervix:  Absent  Uterus: Absent  Adnexa/parametria:     Rt: Without masses or tenderness.   Lt: Without masses or tenderness.  Anus and perineum: Normal  Digital rectal exam: Normal  sphincter tone without palpated masses or tenderness  Assessment/Plan:  67 y.o. MWF G2P2 for breast and pelvic exam with no complaints.  TVH for endometriosis on HRT Pacemaker-cardiologist manages  Plan: SBE's, continue annual mammogram  With 3D tomography, dense breast. Increase regular exercise, calcium rich diet, vitamin D 2000 daily encouraged. Having trouble with rashes with Climara patch. Will try Vivelle dot 0.05 twice weekly, reviewed risks of blood clots, strokes, breast cancer. Discussed decreasing frequency of patches and stopping. States has had numerous hot flushes when has tried to stop in the past. Repeat DEXA next year.        Harrington Challenger [facility], 11:58 AM 09/12/2013

## 2013-09-12 NOTE — Patient Instructions (Signed)
Health Recommendations for Postmenopausal Women Respected and ongoing research has looked at the most common causes of death, disability, and poor quality of life in postmenopausal women. The causes include heart disease, diseases of blood vessels, diabetes, depression, cancer, and bone loss (osteoporosis). Many things can be done to help lower the chances of developing these and other common problems: CARDIOVASCULAR DISEASE Heart Disease: A heart attack is a medical emergency. Know the signs and symptoms of a heart attack. Below are things women can do to reduce their risk for heart disease.   Do not smoke. If you smoke, quit.  Aim for a healthy weight. Being overweight causes many preventable deaths. Eat a healthy and balanced diet and drink an adequate amount of liquids.  Get moving. Make a commitment to be more physically active. Aim for 30 minutes of activity on most, if not all days of the week.  Eat for heart health. Choose a diet that is low in saturated fat and cholesterol and eliminate trans fat. Include whole grains, vegetables, and fruits. Read and understand the labels on food containers before buying.  Know your numbers. Ask your caregiver to check your blood pressure, cholesterol (total, HDL, LDL, triglycerides) and blood glucose. Work with your caregiver on improving your entire clinical picture.  High blood pressure. Limit or stop your table salt intake (try salt substitute and food seasonings). Avoid salty foods and drinks. Read labels on food containers before buying. Eating well and exercising can help control high blood pressure. STROKE  Stroke is a medical emergency. Stroke may be the result of a blood clot in a blood vessel in the brain or by a brain hemorrhage (bleeding). Know the signs and symptoms of a stroke. To lower the risk of developing a stroke:  Avoid fatty foods.  Quit smoking.  Control your diabetes, blood pressure, and irregular heart rate. THROMBOPHLEBITIS  (BLOOD CLOT) OF THE LEG  Becoming overweight and leading a stationary lifestyle may also contribute to developing blood clots. Controlling your diet and exercising will help lower the risk of developing blood clots. CANCER SCREENING  Breast Cancer: Take steps to reduce your risk of breast cancer.  You should practice "breast self-awareness." This means understanding the normal appearance and feel of your breasts and should include breast self-examination. Any changes detected, no matter how small, should be reported to your caregiver.  After age 40, you should have a clinical breast exam (CBE) every year.  Starting at age 40, you should consider having a mammogram (breast X-ray) every year.  If you have a family history of breast cancer, talk to your caregiver about genetic screening.  If you are at high risk for breast cancer, talk to your caregiver about having an MRI and a mammogram every year.  Intestinal or Stomach Cancer: Tests to consider are a rectal exam, fecal occult blood, sigmoidoscopy, and colonoscopy. Women who are high risk may need to be screened at an earlier age and more often.  Cervical Cancer:  Beginning at age 30, you should have a Pap test every 3 years as long as the past 3 Pap tests have been normal.  If you have had past treatment for cervical cancer or a condition that could lead to cancer, you need Pap tests and screening for cancer for at least 20 years after your treatment.  If you had a hysterectomy for a problem that was not cancer or a condition that could lead to cancer, then you no longer need Pap tests.    If you are between ages 65 and 70, and you have had normal Pap tests going back 10 years, you no longer need Pap tests.  If Pap tests have been discontinued, risk factors (such as a new sexual partner) need to be reassessed to determine if screening should be resumed.  Some medical problems can increase the chance of getting cervical cancer. In these  cases, your caregiver may recommend more frequent screening and Pap tests.  Uterine Cancer: If you have vaginal bleeding after reaching menopause, you should notify your caregiver.  Ovarian cancer: Other than yearly pelvic exams, there are no reliable tests available to screen for ovarian cancer at this time except for yearly pelvic exams.  Lung Cancer: Yearly chest X-rays can detect lung cancer and should be done on high risk women, such as cigarette smokers and women with chronic lung disease (emphysema).  Skin Cancer: A complete body skin exam should be done at your yearly examination. Avoid overexposure to the sun and ultraviolet light lamps. Use a strong sun block cream when in the sun. All of these things are important in lowering the risk of skin cancer. MENOPAUSE Menopause Symptoms: Hormone therapy products are effective for treating symptoms associated with menopause:  Moderate to severe hot flashes.  Night sweats.  Mood swings.  Headaches.  Tiredness.  Loss of sex drive.  Insomnia.  Other symptoms. Hormone replacement carries certain risks, especially in older women. Women who use or are thinking about using estrogen or estrogen with progestin treatments should discuss that with their caregiver. Your caregiver will help you understand the benefits and risks. The ideal dose of hormone replacement therapy is not known. The Food and Drug Administration (FDA) has concluded that hormone therapy should be used only at the lowest doses and for the shortest amount of time to reach treatment goals.  OSTEOPOROSIS Protecting Against Bone Loss and Preventing Fracture: If you use hormone therapy for prevention of bone loss (osteoporosis), the risks for bone loss must outweigh the risk of the therapy. Ask your caregiver about other medications known to be safe and effective for preventing bone loss and fractures. To guard against bone loss or fractures, the following is recommended:  If  you are less than age 50, take 1000 mg of calcium and at least 600 mg of Vitamin D per day.  If you are greater than age 50 but less than age 70, take 1200 mg of calcium and at least 600 mg of Vitamin D per day.  If you are greater than age 70, take 1200 mg of calcium and at least 800 mg of Vitamin D per day. Smoking and excessive alcohol intake increases the risk of osteoporosis. Eat foods rich in calcium and vitamin D and do weight bearing exercises several times a week as your caregiver suggests. DIABETES Diabetes Melitus: If you have Type I or Type 2 diabetes, you should keep your blood sugar under control with diet, exercise and recommended medication. Avoid too many sweets, starchy and fatty foods. Being overweight can make control more difficult. COGNITION AND MEMORY Cognition and Memory: Menopausal hormone therapy is not recommended for the prevention of cognitive disorders such as Alzheimer's disease or memory loss.  DEPRESSION  Depression may occur at any age, but is common in elderly women. The reasons may be because of physical, medical, social (loneliness), or financial problems and needs. If you are experiencing depression because of medical problems and control of symptoms, talk to your caregiver about this. Physical activity and   exercise may help with mood and sleep. Community and volunteer involvement may help your sense of value and worth. If you have depression and you feel that the problem is getting worse or becoming severe, talk to your caregiver about treatment options that are best for you. ACCIDENTS  Accidents are common and can be serious in the elderly woman. Prepare your house to prevent accidents. Eliminate throw rugs, place hand bars in the bath, shower and toilet areas. Avoid wearing high heeled shoes or walking on wet, snowy, and icy areas. Limit or stop driving if you have vision or hearing problems, or you feel you are unsteady with you movements and  reflexes. HEPATITIS C Hepatitis C is a type of viral infection affecting the liver. It is spread mainly through contact with blood from an infected person. It can be treated, but if left untreated, it can lead to severe liver damage over years. Many people who are infected do not know that the virus is in their blood. If you are a "baby-boomer", it is recommended that you have one screening test for Hepatitis C. IMMUNIZATIONS  Several immunizations are important to consider having during your senior years, including:   Tetanus, diptheria, and pertussis booster shot.  Influenza every year before the flu season begins.  Pneumonia vaccine.  Shingles vaccine.  Others as indicated based on your specific needs. Talk to your caregiver about these. Document Released: 12/04/2005 Document Revised: 09/28/2012 Document Reviewed: 07/30/2008 ExitCare Patient Information 2014 [employer].  

## 2013-09-25 ENCOUNTER — Encounter: Payer: Self-pay | Admitting: Obstetrics and Gynecology

## 2013-10-03 ENCOUNTER — Encounter: Payer: Self-pay | Admitting: *Deleted

## 2013-10-10 ENCOUNTER — Telehealth: Payer: Self-pay | Admitting: *Deleted

## 2013-10-10 NOTE — Telephone Encounter (Signed)
Prior authorization filled out for estradiol (vivelle-dot) patch 0.05 mg faxed to [facility]. Will wait for response.

## 2013-10-13 NOTE — Telephone Encounter (Signed)
Rx was denied by [facility]

## 2013-10-23 ENCOUNTER — Encounter: Payer: Self-pay | Admitting: Internal Medicine

## 2013-11-14 ENCOUNTER — Other Ambulatory Visit: Payer: Self-pay | Admitting: Women's Health

## 2013-11-14 MED ORDER — ESTRADIOL 0.5 MG PO TABS: 0.5000 mg | ORAL_TABLET | Freq: Every day | ORAL | Status: DC

## 2013-11-14 NOTE — Progress Notes (Signed)
Presented to office to discuss HRT. Has been on Climara patch but was having problems with rashes, minimal coverage with Vivelle dot, requested something less expensive. Will try Estrace 0.5 mg states feels well on HRT, is aware of risks of blood clots, strokes, breast cancer. Would like to continue HRT.

## 2013-12-08 ENCOUNTER — Ambulatory Visit (INDEPENDENT_AMBULATORY_CARE_PROVIDER_SITE_OTHER): Payer: Medicare Other | Admitting: *Deleted

## 2013-12-08 DIAGNOSIS — I495 Sick sinus syndrome: Secondary | ICD-10-CM

## 2013-12-08 LAB — MDC_IDC_ENUM_SESS_TYPE_REMOTE
Brady Statistic AP VS Percent: 74 %
Brady Statistic AS VP Percent: 5.3 %
Lead Channel Pacing Threshold Amplitude: 0.75 V
Lead Channel Pacing Threshold Amplitude: 0.875 V
Lead Channel Pacing Threshold Pulse Width: 0.4 ms
Lead Channel Pacing Threshold Pulse Width: 0.4 ms
Lead Channel Sensing Intrinsic Amplitude: 11.2 mV
MDC IDC MSMT LEADCHNL RA IMPEDANCE VALUE: 486 Ohm
MDC IDC MSMT LEADCHNL RA SENSING INTR AMPL: 2.8 mV
MDC IDC MSMT LEADCHNL RV IMPEDANCE VALUE: 524 Ohm
MDC IDC STAT BRADY AP VP PERCENT: 0.9 %
MDC IDC STAT BRADY AS VS PERCENT: 19.8 %

## 2014-01-15 ENCOUNTER — Encounter: Payer: Self-pay | Admitting: *Deleted

## 2014-01-23 ENCOUNTER — Encounter: Payer: Self-pay | Admitting: Internal Medicine

## 2014-03-21 ENCOUNTER — Encounter: Payer: Medicare Other | Admitting: Internal Medicine

## 2014-03-30 ENCOUNTER — Ambulatory Visit (INDEPENDENT_AMBULATORY_CARE_PROVIDER_SITE_OTHER): Payer: Medicare Other | Admitting: Women's Health

## 2014-03-30 DIAGNOSIS — M549 Dorsalgia, unspecified: Secondary | ICD-10-CM

## 2014-03-30 LAB — WET PREP FOR TRICH, YEAST, CLUE
CLUE CELLS WET PREP: NONE SEEN
Trich, Wet Prep: NONE SEEN
YEAST WET PREP: NONE SEEN

## 2014-03-30 LAB — URINALYSIS W MICROSCOPIC + REFLEX CULTURE
Bilirubin Urine: NEGATIVE
Glucose, UA: NEGATIVE mg/dL
Ketones, ur: NEGATIVE mg/dL
LEUKOCYTES UA: NEGATIVE
NITRITE: NEGATIVE
Protein, ur: NEGATIVE mg/dL
SPECIFIC GRAVITY, URINE: 1.02 (ref 1.005–1.030)
UROBILINOGEN UA: 0.2 mg/dL (ref 0.0–1.0)
pH: 6.5 (ref 5.0–8.0)

## 2014-03-30 MED ORDER — NYSTATIN-TRIAMCINOLONE 100000-0.1 UNIT/GM-% EX OINT: 1.0000 "application " | TOPICAL_OINTMENT | Freq: Two times a day (BID) | CUTANEOUS | Status: DC

## 2014-03-30 NOTE — Progress Notes (Signed)
Patient ID: Judy Chapman, female   DOB: 04-Feb-1946, 68 y.o.   MRN: [id number] Presents with complaint of low back ache, vaginal irritation, increased urinary frequency, and abdominal bloating for 2 days. Has just returned from a car trip from [place]. Denies pain or burning with urination, discharge with itching or odor, fever. Denies constipation. TVH for endometriosis on Climara patch.  Exam: Appears well. External genitalia erythematous at introitus, speculum exam scant discharge wet prep negative. Bimanual no  adnexal fullness or tenderness. UA: Negative  Vaginal irritation, Urinary frequency  Plan: Mycolog ointment externally to introitus twice daily, instructed to call if no relief of symptoms, urine culture pending.

## 2014-03-31 LAB — URINE CULTURE

## 2014-04-04 ENCOUNTER — Telehealth: Payer: Self-pay

## 2014-04-04 NOTE — Telephone Encounter (Signed)
Urine Culture was negative.

## 2014-04-04 NOTE — Telephone Encounter (Signed)
Called to check on urine culture result from Friday.  It appears to be negative but she did have trace RBC on u/a.

## 2014-04-04 NOTE — Telephone Encounter (Signed)
Patient informed. 

## 2014-05-01 ENCOUNTER — Ambulatory Visit (INDEPENDENT_AMBULATORY_CARE_PROVIDER_SITE_OTHER): Payer: Medicare Other | Admitting: Internal Medicine

## 2014-05-01 ENCOUNTER — Encounter: Payer: Self-pay | Admitting: Internal Medicine

## 2014-05-01 VITALS — BP 118/75 | HR 87 | Ht 66.0 in | Wt 179.0 lb

## 2014-05-01 DIAGNOSIS — Z950 Presence of cardiac pacemaker: Secondary | ICD-10-CM

## 2014-05-01 DIAGNOSIS — I495 Sick sinus syndrome: Secondary | ICD-10-CM

## 2014-05-01 LAB — MDC_IDC_ENUM_SESS_TYPE_INCLINIC
Battery Impedance: 204 Ohm
Battery Remaining Longevity: 130 mo
Battery Voltage: 2.8 V
Brady Statistic AP VS Percent: 77 %
Brady Statistic AS VP Percent: 5 %
Brady Statistic AS VS Percent: 18 %
Date Time Interrogation Session: 20150707174111
Lead Channel Impedance Value: 454 Ohm
Lead Channel Pacing Threshold Amplitude: 0.5 V
Lead Channel Pacing Threshold Pulse Width: 0.4 ms
Lead Channel Sensing Intrinsic Amplitude: 2 mV
Lead Channel Sensing Intrinsic Amplitude: 5.6 mV
Lead Channel Setting Pacing Amplitude: 2.5 V
Lead Channel Setting Pacing Pulse Width: 0.4 ms
MDC IDC MSMT LEADCHNL RV IMPEDANCE VALUE: 535 Ohm
MDC IDC MSMT LEADCHNL RV PACING THRESHOLD AMPLITUDE: 0.75 V
MDC IDC MSMT LEADCHNL RV PACING THRESHOLD PULSEWIDTH: 0.4 ms
MDC IDC SET LEADCHNL RA PACING AMPLITUDE: 2 V
MDC IDC SET LEADCHNL RV SENSING SENSITIVITY: 2.8 mV
MDC IDC STAT BRADY AP VP PERCENT: 1 %

## 2014-05-01 NOTE — Patient Instructions (Signed)
Your physician has recommended you make the following change in your medication:  1) STOP Amlodipine for 1-2 weeks to see how BP responds. Please call us to let us know how BP is doing at the end of this trial.  Remote monitoring is used to monitor your Pacemaker of ICD from home. This monitoring reduces the number of office visits required to check your device to one time per year. It allows [place] to keep an eye on the functioning of your device to ensure it is working properly. You are scheduled for a device check from home on 07/31/14. You may send your transmission at any time that day. If you have a wireless device, the transmission will be sent automatically. After your physician reviews your transmission, you will receive a postcard with your next transmission date.  Your physician wants you to follow-up in: 1 year with Dr. Caryl Comes.  You will receive a reminder letter in the mail two months in advance. If you don't receive a letter, please call our office to schedule the follow-up appointment.

## 2014-05-01 NOTE — Progress Notes (Signed)
Patient Care Team: Jerlyn Ly, MD as PCP - General (Internal Medicine)   HPI  Judy Chapman is a 68 y.o. 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 It was initially implanted in 2002 with device generator replacement March 2010  by Dr. Doreatha Lew.   She has a history of hypertension and had been on ACE inhibitors. She apparently had angioedema.   Echo 2006 demonstrated normal LV function  The patient denies chest pain, shortness of breath, nocturnal dyspnea, orthopnea or peripheral edema.  There have been no palpitations, lightheadedness or syncope.   Her blood pressure which she recorded at home runs 115 range.    Past Medical History  Diagnosis Date  . PVC (premature ventricular contraction)   . HTN (hypertension)   . Endometriosis   . Osteopenia   . Sinus node dysfunction   . Pacemaker-MDT     DOI-2002, Generator change 2010  . Pneumonia     Past Surgical History  Procedure Laterality Date  . Coronary angioplasty    . Pacemaker insertion      Medtronic  . Vaginal hysterectomy    . Gallbladder surgery    . Tubal ligation    . Dilation and curettage of uterus    . Hysteroscopy    . Breast lumpectomy      Benign    Current Outpatient Prescriptions  Medication Sig Dispense Refill  . amLODipine (NORVASC) 5 MG tablet Take 5 mg by mouth daily.       Marland Kitchen aspirin 81 MG tablet Take 81 mg by mouth daily.        . Cholecalciferol (VITAMIN D PO) Take by mouth.      . citalopram (CELEXA) 20 MG tablet Take 1 tablet (20 mg total) by mouth daily.  90 tablet  2  . estradiol (ESTRACE) 0.5 MG tablet Take 1 tablet (0.5 mg total) by mouth daily.  30 tablet  11  . nebivolol (BYSTOLIC) 10 MG tablet Take 10 mg by mouth daily.      Marland Kitchen nystatin-triamcinolone ointment (MYCOLOG) Apply 1 application topically 2 (two) times daily.  30 g  0  . PRAVASTATIN SODIUM PO Take by mouth.      . triamterene-hydrochlorothiazide (DYAZIDE) 37.5-25 MG per  capsule Take 1 capsule by mouth daily.       No current facility-administered medications for this visit.    Allergies  Allergen Reactions  . Augmentin [Amoxicillin-Pot Clavulanate]     diarrhea  . Ramipril Swelling    Review of Systems negative except from HPI and PMH  Physical Exam BP 118/75  Pulse 87  Ht 5\' 6"  (1.676 m)  Wt 179 lb (81.194 kg)  BMI 28.91 kg/m2 Well developed and well nourished in no acute distress HENT normal E scleral and icterus clear Neck Supple JVP flat; carotids brisk and full Clear to ausculation  Regular rate and rhythm, no murmurs gallops or rub Soft with active bowel sounds No clubbing cyanosis  Edema Alert and oriented, grossly normal motor and sensory function Skin Warm and Dry  ECG demonstrates atrial pacing Intervals 22/09/37 Low-voltage  Assessment and  Plan Hypertension  Sinus node dysfunction  Pacemaker-Medtronic  The patient's device was interrogated.  The information was reviewed. No changes were made in the programming.    Heart rate excursion is adequate.  We will decrease her antihypertensives specifically demyelinating her amlodipine. She will track her blood pressure for the next couple of weeks and  we'll not resume it unless her systolic persists above 916.

## 2014-06-05 ENCOUNTER — Telehealth: Payer: Self-pay | Admitting: Internal Medicine

## 2014-06-05 NOTE — Telephone Encounter (Signed)
Patient called to report that she had stopped her Norvasc per Dr.Klein and called to report her BP readings. They are the following: 7/29 131/90 7/30 128/88 7/31 130/91 8/1 122/85 8/2 115/78 8/3 114/78 8/11 124/79  Advised patient to stay off Norvasc for now and will forward message to [place]. Patient feels well without any complaints.

## 2014-06-05 NOTE — Telephone Encounter (Signed)
New problem   Pt need to speak to nurse concerning blood pressure readings. Please call pt.

## 2014-07-31 ENCOUNTER — Ambulatory Visit (INDEPENDENT_AMBULATORY_CARE_PROVIDER_SITE_OTHER): Payer: Medicare Other | Admitting: *Deleted

## 2014-07-31 DIAGNOSIS — I495 Sick sinus syndrome: Secondary | ICD-10-CM

## 2014-08-01 LAB — MDC_IDC_ENUM_SESS_TYPE_REMOTE
Battery Impedance: 227 Ohm
Battery Remaining Longevity: 126 mo
Battery Voltage: 2.8 V
Brady Statistic AP VS Percent: 81 %
Date Time Interrogation Session: 20151006221332
Lead Channel Pacing Threshold Amplitude: 0.75 V
Lead Channel Pacing Threshold Pulse Width: 0.4 ms
Lead Channel Pacing Threshold Pulse Width: 0.4 ms
Lead Channel Sensing Intrinsic Amplitude: 5.6 mV
Lead Channel Setting Pacing Amplitude: 2 V
Lead Channel Setting Pacing Amplitude: 2.5 V
Lead Channel Setting Pacing Pulse Width: 0.4 ms
MDC IDC MSMT LEADCHNL RA IMPEDANCE VALUE: 468 Ohm
MDC IDC MSMT LEADCHNL RA PACING THRESHOLD AMPLITUDE: 0.625 V
MDC IDC MSMT LEADCHNL RV IMPEDANCE VALUE: 546 Ohm
MDC IDC SET LEADCHNL RV SENSING SENSITIVITY: 2.8 mV
MDC IDC STAT BRADY AP VP PERCENT: 1 %
MDC IDC STAT BRADY AS VP PERCENT: 3 %
MDC IDC STAT BRADY AS VS PERCENT: 15 %

## 2014-08-01 NOTE — Progress Notes (Signed)
Remote pacemaker transmission.   

## 2014-08-14 ENCOUNTER — Encounter: Payer: Self-pay | Admitting: Cardiology

## 2014-08-27 ENCOUNTER — Encounter: Payer: Self-pay | Admitting: Internal Medicine

## 2014-08-31 ENCOUNTER — Encounter: Payer: Self-pay | Admitting: Internal Medicine

## 2014-10-30 ENCOUNTER — Other Ambulatory Visit: Payer: Self-pay

## 2014-10-30 DIAGNOSIS — Z1231 Encounter for screening mammogram for malignant neoplasm of breast: Secondary | ICD-10-CM

## 2014-10-31 ENCOUNTER — Ambulatory Visit (INDEPENDENT_AMBULATORY_CARE_PROVIDER_SITE_OTHER): Payer: Medicare Other | Admitting: *Deleted

## 2014-10-31 DIAGNOSIS — I495 Sick sinus syndrome: Secondary | ICD-10-CM

## 2014-10-31 LAB — MDC_IDC_ENUM_SESS_TYPE_REMOTE
Battery Impedance: 252 Ohm
Battery Remaining Longevity: 125 mo
Battery Voltage: 2.8 V
Brady Statistic AP VS Percent: 78 %
Brady Statistic AS VP Percent: 3 %
Brady Statistic AS VS Percent: 19 %
Date Time Interrogation Session: 20160106151136
Lead Channel Pacing Threshold Amplitude: 0.625 V
Lead Channel Pacing Threshold Amplitude: 0.625 V
Lead Channel Sensing Intrinsic Amplitude: 5.6 mV
Lead Channel Setting Pacing Amplitude: 2.5 V
Lead Channel Setting Pacing Pulse Width: 0.4 ms
Lead Channel Setting Sensing Sensitivity: 2 mV
MDC IDC MSMT LEADCHNL RA IMPEDANCE VALUE: 494 Ohm
MDC IDC MSMT LEADCHNL RA PACING THRESHOLD PULSEWIDTH: 0.4 ms
MDC IDC MSMT LEADCHNL RA SENSING INTR AMPL: 0.7 mV
MDC IDC MSMT LEADCHNL RV IMPEDANCE VALUE: 566 Ohm
MDC IDC MSMT LEADCHNL RV PACING THRESHOLD PULSEWIDTH: 0.4 ms
MDC IDC SET LEADCHNL RA PACING AMPLITUDE: 2 V
MDC IDC STAT BRADY AP VP PERCENT: 1 %

## 2014-10-31 NOTE — Progress Notes (Signed)
Remote pacemaker transmission.   

## 2014-11-13 ENCOUNTER — Ambulatory Visit
Admission: RE | Admit: 2014-11-13 | Discharge: 2014-11-13 | Disposition: A | Discharge status: Home / Self Care | Payer: Medicare Other | Source: Ambulatory Visit

## 2014-11-13 DIAGNOSIS — Z1231 Encounter for screening mammogram for malignant neoplasm of breast: Secondary | ICD-10-CM

## 2014-11-29 ENCOUNTER — Other Ambulatory Visit: Payer: Self-pay | Admitting: Women's Health

## 2014-12-14 ENCOUNTER — Encounter: Payer: Self-pay | Admitting: Women's Health

## 2014-12-14 ENCOUNTER — Ambulatory Visit (INDEPENDENT_AMBULATORY_CARE_PROVIDER_SITE_OTHER): Payer: Medicare Other | Admitting: Women's Health

## 2014-12-14 VITALS — BP 138/80 | Ht 66.0 in | Wt 177.0 lb

## 2014-12-14 DIAGNOSIS — Z79890 Hormone replacement therapy (postmenopausal): Secondary | ICD-10-CM

## 2014-12-14 DIAGNOSIS — Z01419 Encounter for gynecological examination (general) (routine) without abnormal findings: Secondary | ICD-10-CM

## 2014-12-14 MED ORDER — ESTRADIOL 0.5 MG PO TABS: 0.5000 mg | ORAL_TABLET | Freq: Every day | ORAL | Status: DC

## 2014-12-14 NOTE — Progress Notes (Signed)
Judy Chapman May 30, 1946 [id number]    History:    Presents for annual exam.  TVH for endometriosis on 0.5 estradiol daily has tried to wean off in the past and has numerous hot flashes and poor sleep. Normal Pap and mammogram history. Negative colonoscopy 2014. Current on immunizations. History of osteopenia DEXA has scheduled through primary care. Hypertension managed by primary care. Pacemaker.  Past medical history, past surgical history, family history and social history were all reviewed and documented in the EPIC chart. 2 daughters 1 lives locally one in [place]. New dog Judy Chapman.  ROS:  A ROS was performed and pertinent positives and negatives are included.  Exam:  Filed Vitals:   12/14/69 1052  BP: 138/80    General appearance:  Normal Thyroid:  Symmetrical, normal in size, without palpable masses or nodularity. Respiratory  Auscultation:  Clear without wheezing or rhonchi Cardiovascular  Auscultation:  Regular rate, without rubs, murmurs or gallops  Edema/varicosities:  Not grossly evident Abdominal  Soft,nontender, without masses, guarding or rebound.  Liver/spleen:  No organomegaly noted  Hernia:  None appreciated  Skin  Inspection:  Grossly normal   Breasts: Examined lying and sitting.     Right: Without masses, retractions, discharge or axillary adenopathy.     Left: Without masses, retractions, discharge or axillary adenopathy. Gentitourinary   Inguinal/mons:  Normal without inguinal adenopathy  External genitalia:  Normal  BUS/Urethra/Skene's glands:  Normal  Vagina:  Normal  Cervix:  And uterus absent  Adnexa/parametria:     Rt: Without masses or tenderness.   Lt: Without masses or tenderness.  Anus and perineum: Normal  Digital rectal exam: Normal sphincter tone without palpated masses or tenderness  Assessment/Plan:  69 y.o. MWF G2 P2 for annual exam with no complaints.   TVH for endometriosis on estradiol Hypertension/Osteopenia  managed by primary care labs and meds  Plan: HRT reviewed, reviewed importance of decreasing/stopping HRT, women's health initiative, risk for blood clots, strokes, breast cancer reviewed. Estradiol 0.5 will try to half tablet daily and wean off. Prescription given. SBE's, continue annual 3-D mammogram, history of dense breasts. Regular exercise, home safety and fall prevention discussed, calcium rich diet, vitamin D 2000 daily encouraged. UA, Pap screening guidelines reviewed.  Judy Chapman [facility], 11:50 AM 12/14/2014

## 2014-12-14 NOTE — Patient Instructions (Signed)
Health Recommendations for Postmenopausal Women Respected and ongoing research has looked at the most common causes of death, disability, and poor quality of life in postmenopausal women. The causes include heart disease, diseases of blood vessels, diabetes, depression, cancer, and bone loss (osteoporosis). Many things can be done to help lower the chances of developing these and other common problems. CARDIOVASCULAR DISEASE Heart Disease: A heart attack is a medical emergency. Know the signs and symptoms of a heart attack. Below are things women can do to reduce their risk for heart disease.   Do not smoke. If you smoke, quit.  Aim for a healthy weight. Being overweight causes many preventable deaths. Eat a healthy and balanced diet and drink an adequate amount of liquids.  Get moving. Make a commitment to be more physically active. Aim for 30 minutes of activity on most, if not all days of the week.  Eat for heart health. Choose a diet that is low in saturated fat and cholesterol and eliminate trans fat. Include whole grains, vegetables, and fruits. Read and understand the labels on food containers before buying.  Know your numbers. Ask your caregiver to check your blood pressure, cholesterol (total, HDL, LDL, triglycerides) and blood glucose. Work with your caregiver on improving your entire clinical picture.  High blood pressure. Limit or stop your table salt intake (try salt substitute and food seasonings). Avoid salty foods and drinks. Read labels on food containers before buying. Eating well and exercising can help control high blood pressure. STROKE  Stroke is a medical emergency. Stroke may be the result of a blood clot in a blood vessel in the brain or by a brain hemorrhage (bleeding). Know the signs and symptoms of a stroke. To lower the risk of developing a stroke:  Avoid fatty foods.  Quit smoking.  Control your diabetes, blood pressure, and irregular heart rate. THROMBOPHLEBITIS  (BLOOD CLOT) OF THE LEG  Becoming overweight and leading a stationary lifestyle may also contribute to developing blood clots. Controlling your diet and exercising will help lower the risk of developing blood clots. CANCER SCREENING  Breast Cancer: Take steps to reduce your risk of breast cancer.  You should practice "breast self-awareness." This means understanding the normal appearance and feel of your breasts and should include breast self-examination. Any changes detected, no matter how small, should be reported to your caregiver.  After age 40, you should have a clinical breast exam (CBE) every year.  Starting at age 40, you should consider having a mammogram (breast X-ray) every year.  If you have a family history of breast cancer, talk to your caregiver about genetic screening.  If you are at high risk for breast cancer, talk to your caregiver about having an MRI and a mammogram every year.  Intestinal or Stomach Cancer: Tests to consider are a rectal exam, fecal occult blood, sigmoidoscopy, and colonoscopy. Women who are high risk may need to be screened at an earlier age and more often.  Cervical Cancer:  Beginning at age 30, you should have a Pap test every 3 years as long as the past 3 Pap tests have been normal.  If you have had past treatment for cervical cancer or a condition that could lead to cancer, you need Pap tests and screening for cancer for at least 20 years after your treatment.  If you had a hysterectomy for a problem that was not cancer or a condition that could lead to cancer, then you no longer need Pap tests.    If you are between ages 65 and 70, and you have had normal Pap tests going back 10 years, you no longer need Pap tests.  If Pap tests have been discontinued, risk factors (such as a new sexual partner) need to be reassessed to determine if screening should be resumed.  Some medical problems can increase the chance of getting cervical cancer. In these  cases, your caregiver may recommend more frequent screening and Pap tests.  Uterine Cancer: If you have vaginal bleeding after reaching menopause, you should notify your caregiver.  Ovarian Cancer: Other than yearly pelvic exams, there are no reliable tests available to screen for ovarian cancer at this time except for yearly pelvic exams.  Lung Cancer: Yearly chest X-rays can detect lung cancer and should be done on high risk women, such as cigarette smokers and women with chronic lung disease (emphysema).  Skin Cancer: A complete body skin exam should be done at your yearly examination. Avoid overexposure to the sun and ultraviolet light lamps. Use a strong sun block cream when in the sun. All of these things are important for lowering the risk of skin cancer. MENOPAUSE Menopause Symptoms: Hormone therapy products are effective for treating symptoms associated with menopause:  Moderate to severe hot flashes.  Night sweats.  Mood swings.  Headaches.  Tiredness.  Loss of sex drive.  Insomnia.  Other symptoms. Hormone replacement carries certain risks, especially in older women. Women who use or are thinking about using estrogen or estrogen with progestin treatments should discuss that with their caregiver. Your caregiver will help you understand the benefits and risks. The ideal dose of hormone replacement therapy is not known. The Food and Drug Administration (FDA) has concluded that hormone therapy should be used only at the lowest doses and for the shortest amount of time to reach treatment goals.  OSTEOPOROSIS Protecting Against Bone Loss and Preventing Fracture If you use hormone therapy for prevention of bone loss (osteoporosis), the risks for bone loss must outweigh the risk of the therapy. Ask your caregiver about other medications known to be safe and effective for preventing bone loss and fractures. To guard against bone loss or fractures, the following is recommended:  If  you are younger than age 50, take 1000 mg of calcium and at least 600 mg of Vitamin D per day.  If you are older than age 50 but younger than age 70, take 1200 mg of calcium and at least 600 mg of Vitamin D per day.  If you are older than age 70, take 1200 mg of calcium and at least 800 mg of Vitamin D per day. Smoking and excessive alcohol intake increases the risk of osteoporosis. Eat foods rich in calcium and vitamin D and do weight bearing exercises several times a week as your caregiver suggests. DIABETES Diabetes Mellitus: If you have type I or type 2 diabetes, you should keep your blood sugar under control with diet, exercise, and recommended medication. Avoid starchy and fatty foods, and too many sweets. Being overweight can make diabetes control more difficult. COGNITION AND MEMORY Cognition and Memory: Menopausal hormone therapy is not recommended for the prevention of cognitive disorders such as Alzheimer's disease or memory loss.  DEPRESSION  Depression may occur at any age, but it is common in elderly women. This may be because of physical, medical, social (loneliness), or financial problems and needs. If you are experiencing depression because of medical problems and control of symptoms, talk to your caregiver about this. Physical   activity and exercise may help with mood and sleep. Community and volunteer involvement may improve your sense of value and worth. If you have depression and you feel that the problem is getting worse or becoming severe, talk to your caregiver about which treatment options are best for you. ACCIDENTS  Accidents are common and can be serious in elderly woman. Prepare your house to prevent accidents. Eliminate throw rugs, place hand bars in bath, shower, and toilet areas. Avoid wearing high heeled shoes or walking on wet, snowy, and icy areas. Limit or stop driving if you have vision or hearing problems, or if you feel you are unsteady with your movements and  reflexes. HEPATITIS C Hepatitis C is a type of viral infection affecting the liver. It is spread mainly through contact with blood from an infected person. It can be treated, but if left untreated, it can lead to severe liver damage over the years. Many people who are infected do not know that the virus is in their blood. If you are a "baby-boomer", it is recommended that you have one screening test for Hepatitis C. IMMUNIZATIONS  Several immunizations are important to consider having during your senior years, including:   Tetanus, diphtheria, and pertussis booster shot.  Influenza every year before the flu season begins.  Pneumonia vaccine.  Shingles vaccine.  Others, as indicated based on your specific needs. Talk to your caregiver about these. Document Released: 12/04/2005 Document Revised: 02/26/2014 Document Reviewed: 07/30/2008 Exit[employer] Patient Information 2015 [employer]. This information is not intended to replace advice given to you by your health care provider. Make sure you discuss any questions you have with your health care provider.  

## 2014-12-18 ENCOUNTER — Encounter: Payer: Self-pay | Admitting: Women's Health

## 2015-01-02 ENCOUNTER — Encounter: Payer: Self-pay | Admitting: *Deleted

## 2015-01-10 ENCOUNTER — Encounter: Payer: Self-pay | Admitting: Internal Medicine

## 2015-02-04 ENCOUNTER — Ambulatory Visit (INDEPENDENT_AMBULATORY_CARE_PROVIDER_SITE_OTHER): Payer: Medicare Other | Admitting: *Deleted

## 2015-02-04 ENCOUNTER — Telehealth: Payer: Self-pay | Admitting: Cardiology

## 2015-02-04 DIAGNOSIS — I495 Sick sinus syndrome: Secondary | ICD-10-CM

## 2015-02-04 LAB — MDC_IDC_ENUM_SESS_TYPE_REMOTE
Battery Impedance: 252 Ohm
Battery Remaining Longevity: 124 mo
Battery Voltage: 2.8 V
Brady Statistic AS VS Percent: 17 %
Date Time Interrogation Session: 20160411162114
Lead Channel Pacing Threshold Pulse Width: 0.4 ms
Lead Channel Setting Pacing Amplitude: 2 V
Lead Channel Setting Pacing Amplitude: 2.5 V
Lead Channel Setting Sensing Sensitivity: 2.8 mV
MDC IDC MSMT LEADCHNL RA IMPEDANCE VALUE: 480 Ohm
MDC IDC MSMT LEADCHNL RA PACING THRESHOLD AMPLITUDE: 0.5 V
MDC IDC MSMT LEADCHNL RA PACING THRESHOLD PULSEWIDTH: 0.4 ms
MDC IDC MSMT LEADCHNL RV IMPEDANCE VALUE: 562 Ohm
MDC IDC MSMT LEADCHNL RV PACING THRESHOLD AMPLITUDE: 0.625 V
MDC IDC MSMT LEADCHNL RV SENSING INTR AMPL: 16 mV
MDC IDC SET LEADCHNL RV PACING PULSEWIDTH: 0.4 ms
MDC IDC STAT BRADY AP VP PERCENT: 1 %
MDC IDC STAT BRADY AP VS PERCENT: 80 %
MDC IDC STAT BRADY AS VP PERCENT: 2 %

## 2015-02-04 NOTE — Progress Notes (Signed)
Remote pacemaker transmission.   

## 2015-02-04 NOTE — Telephone Encounter (Signed)
Spoke with pt and reminded pt of remote transmission that is due today. Pt verbalized understanding.   

## 2015-03-05 ENCOUNTER — Encounter: Payer: Self-pay | Admitting: Cardiology

## 2015-03-07 ENCOUNTER — Encounter: Payer: Self-pay | Admitting: Internal Medicine

## 2015-04-22 ENCOUNTER — Other Ambulatory Visit: Payer: Self-pay

## 2015-05-30 ENCOUNTER — Telehealth: Payer: Self-pay | Admitting: Internal Medicine

## 2015-05-30 NOTE — Telephone Encounter (Signed)
New Message       Pt calling wanting to know how to set up her monitor, please call back and advise.

## 2015-05-30 NOTE — Telephone Encounter (Signed)
Spoke w/ pt and she stated that she has home monitor set up correctly and no longer needs assistance.

## 2015-05-31 ENCOUNTER — Encounter: Payer: Self-pay | Admitting: Internal Medicine

## 2015-05-31 ENCOUNTER — Ambulatory Visit (INDEPENDENT_AMBULATORY_CARE_PROVIDER_SITE_OTHER): Payer: Medicare Other | Admitting: Internal Medicine

## 2015-05-31 VITALS — BP 130/80 | HR 81 | Ht 66.0 in | Wt 177.6 lb

## 2015-05-31 DIAGNOSIS — I495 Sick sinus syndrome: Secondary | ICD-10-CM

## 2015-05-31 DIAGNOSIS — Z45018 Encounter for adjustment and management of other part of cardiac pacemaker: Secondary | ICD-10-CM | POA: Diagnosis not present

## 2015-05-31 LAB — CUP PACEART INCLINIC DEVICE CHECK
Battery Impedance: 275 Ohm
Battery Voltage: 2.8 V
Brady Statistic AP VS Percent: 80 %
Brady Statistic AS VP Percent: 2 %
Brady Statistic AS VS Percent: 17 %
Date Time Interrogation Session: 20160805130707
Lead Channel Impedance Value: 487 Ohm
Lead Channel Pacing Threshold Amplitude: 0.5 V
Lead Channel Pacing Threshold Pulse Width: 0.4 ms
Lead Channel Pacing Threshold Pulse Width: 0.4 ms
Lead Channel Sensing Intrinsic Amplitude: 8 mV
Lead Channel Setting Pacing Amplitude: 2 V
Lead Channel Setting Sensing Sensitivity: 2.8 mV
MDC IDC MSMT BATTERY REMAINING LONGEVITY: 120 mo
MDC IDC MSMT LEADCHNL RA SENSING INTR AMPL: 2.8 mV
MDC IDC MSMT LEADCHNL RV IMPEDANCE VALUE: 566 Ohm
MDC IDC MSMT LEADCHNL RV PACING THRESHOLD AMPLITUDE: 0.75 V
MDC IDC SET LEADCHNL RV PACING AMPLITUDE: 2.5 V
MDC IDC SET LEADCHNL RV PACING PULSEWIDTH: 0.4 ms
MDC IDC STAT BRADY AP VP PERCENT: 1 %

## 2015-05-31 NOTE — Progress Notes (Signed)
      Patient Care Team: Crist Infante, MD as PCP - General (Internal Medicine)   HPI  Judy Chapman is a 69 y.o. 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 It was initially implanted in 2002 with device generator replacement March 2010  by Dr. Doreatha Lew.      Echo 2006 demonstrated normal LV function  The patient denies chest pain, shortness of breath, nocturnal dyspnea, orthopnea or peripheral edema.  There have been no palpitations, lightheadedness or syncope.   Her blood pressure which she recorded at home runs 115 range.    Past Medical History  Diagnosis Date  . PVC (premature ventricular contraction)   . HTN (hypertension)   . Endometriosis   . Osteopenia   . Sinus node dysfunction   . Pacemaker-MDT     DOI-2002, Generator change 2010  . Pneumonia     Past Surgical History  Procedure Laterality Date  . Coronary angioplasty    . Pacemaker insertion      Medtronic  . Vaginal hysterectomy    . Gallbladder surgery    . Tubal ligation    . Dilation and curettage of uterus    . Hysteroscopy    . Breast lumpectomy      Benign    Current Outpatient Prescriptions  Medication Sig Dispense Refill  . aspirin 81 MG tablet Take 81 mg by mouth daily.      . Cholecalciferol (VITAMIN D PO) Take 1 tablet by mouth daily.     . citalopram (CELEXA) 20 MG tablet Take 1 tablet (20 mg total) by mouth daily. 90 tablet 2  . estradiol (ESTRACE) 0.5 MG tablet Take 1 tablet (0.5 mg total) by mouth daily. (Patient taking differently: Take 0.5 mg by mouth daily. Taking 1/2 .5 mg daily per pt) 90 tablet 4  . nebivolol (BYSTOLIC) 10 MG tablet Take 10 mg by mouth daily.    Marland Kitchen nystatin-triamcinolone ointment (MYCOLOG) Apply 1 application topically 2 (two) times daily. 30 g 0  . triamterene-hydrochlorothiazide (DYAZIDE) 37.5-25 MG per capsule Take 1 capsule by mouth daily.     No current facility-administered medications for this visit.    Allergies    Allergen Reactions  . Augmentin [Amoxicillin-Pot Clavulanate]     diarrhea  . Ramipril Swelling    Review of Systems negative except from HPI and PMH  Physical Exam BP 130/80 mmHg  Pulse 81  Ht 5\' 6"  (1.676 m)  Wt 177 lb 9.6 oz (80.559 kg)  BMI 28.68 kg/m2 Well developed and well nourished in no acute distress HENT normal E scleral and icterus clear Neck Supple JVP flat; carotids brisk and full Clear to ausculation  Regular rate and rhythm, no murmurs gallops or rub Soft with active bowel sounds No clubbing cyanosis  Edema Alert and oriented, grossly normal motor and sensory function Skin Warm and Dry  ECG demonstrates atrial pacing 81  Intervals 22/08/37 Low-voltage  Assessment and  Plan Hypertension  Well controlled   Sinus node dysfunction  Pacemaker-Medtronic  The patient's device was interrogated.  The information was reviewed. No changes were made in the programming.

## 2015-05-31 NOTE — Patient Instructions (Addendum)
Medication Instructions:  Your physician recommends that you continue on your current medications as directed. Please refer to the Current Medication list given to you today.   Labwork: None ordered  Testing/Procedures: None ordered  Follow-Up: Your physician wants you to follow-up in: 12 months with Dr Gari Crown will receive a reminder letter in the mail two months in advance. If you don't receive a letter, please call our office to schedule the follow-up appointment.   Remote monitoring is used to monitor your Pacemaker  from home. This monitoring reduces the number of office visits required to check your device to one time per year. It allows [place] to keep an eye on the functioning of your device to ensure it is working properly. You are scheduled for a device check from home on 09/02/15. You may send your transmission at any time that day. If you have a wireless device, the transmission will be sent automatically. After your physician reviews your transmission, you will receive a postcard with your next transmission date.     Any Other Special Instructions Will Be Listed Below (If Applicable).

## 2015-06-05 ENCOUNTER — Encounter: Payer: Self-pay | Admitting: Internal Medicine

## 2015-09-02 ENCOUNTER — Ambulatory Visit (INDEPENDENT_AMBULATORY_CARE_PROVIDER_SITE_OTHER): Payer: Medicare Other | Admitting: *Deleted

## 2015-09-02 DIAGNOSIS — I495 Sick sinus syndrome: Secondary | ICD-10-CM

## 2015-09-02 NOTE — Progress Notes (Signed)
Remote pacemaker transmission.   

## 2015-09-03 LAB — CUP PACEART REMOTE DEVICE CHECK
Battery Impedance: 275 Ohm
Battery Remaining Longevity: 119 mo
Battery Voltage: 2.8 V
Brady Statistic AS VP Percent: 2 %
Implantable Lead Implant Date: 20020701
Implantable Lead Location: 753859
Implantable Lead Model: 4469
Implantable Lead Serial Number: 313853
Implantable Lead Serial Number: 323286
Lead Channel Impedance Value: 473 Ohm
Lead Channel Impedance Value: 529 Ohm
Lead Channel Pacing Threshold Amplitude: 0.625 V
Lead Channel Setting Pacing Amplitude: 2 V
Lead Channel Setting Pacing Amplitude: 2.5 V
Lead Channel Setting Pacing Pulse Width: 0.4 ms
MDC IDC LEAD IMPLANT DT: 20020701
MDC IDC LEAD LOCATION: 753860
MDC IDC MSMT LEADCHNL RA PACING THRESHOLD PULSEWIDTH: 0.4 ms
MDC IDC MSMT LEADCHNL RV PACING THRESHOLD AMPLITUDE: 0.625 V
MDC IDC MSMT LEADCHNL RV PACING THRESHOLD PULSEWIDTH: 0.4 ms
MDC IDC MSMT LEADCHNL RV SENSING INTR AMPL: 5.6 mV
MDC IDC SESS DTM: 20161107170544
MDC IDC SET LEADCHNL RV SENSING SENSITIVITY: 2.8 mV
MDC IDC STAT BRADY AP VP PERCENT: 1 %
MDC IDC STAT BRADY AP VS PERCENT: 80 %
MDC IDC STAT BRADY AS VS PERCENT: 18 %

## 2015-09-04 ENCOUNTER — Encounter: Payer: Self-pay | Admitting: Cardiology

## 2015-12-02 ENCOUNTER — Other Ambulatory Visit: Payer: Self-pay

## 2015-12-02 ENCOUNTER — Ambulatory Visit (INDEPENDENT_AMBULATORY_CARE_PROVIDER_SITE_OTHER): Payer: Medicare Other | Admitting: *Deleted

## 2015-12-02 DIAGNOSIS — I495 Sick sinus syndrome: Secondary | ICD-10-CM

## 2015-12-02 DIAGNOSIS — Z1231 Encounter for screening mammogram for malignant neoplasm of breast: Secondary | ICD-10-CM

## 2015-12-03 NOTE — Progress Notes (Signed)
Remote pacemaker transmission.   

## 2015-12-17 ENCOUNTER — Ambulatory Visit
Admission: RE | Admit: 2015-12-17 | Discharge: 2015-12-17 | Disposition: A | Discharge status: Home / Self Care | Payer: Medicare Other | Source: Ambulatory Visit

## 2015-12-17 DIAGNOSIS — Z1231 Encounter for screening mammogram for malignant neoplasm of breast: Secondary | ICD-10-CM

## 2015-12-18 ENCOUNTER — Encounter: Payer: Self-pay | Admitting: Women's Health

## 2015-12-18 ENCOUNTER — Encounter: Payer: Medicare Other | Admitting: Women's Health

## 2015-12-22 LAB — CUP PACEART REMOTE DEVICE CHECK
Brady Statistic AP VS Percent: 79 %
Brady Statistic AS VS Percent: 19 %
Implantable Lead Implant Date: 20020701
Implantable Lead Location: 753859
Implantable Lead Location: 753860
Implantable Lead Model: 4470
Lead Channel Impedance Value: 466 Ohm
Lead Channel Impedance Value: 539 Ohm
Lead Channel Pacing Threshold Amplitude: 0.625 V
Lead Channel Pacing Threshold Amplitude: 0.625 V
Lead Channel Pacing Threshold Pulse Width: 0.4 ms
Lead Channel Pacing Threshold Pulse Width: 0.4 ms
Lead Channel Sensing Intrinsic Amplitude: 0.7 mV
Lead Channel Sensing Intrinsic Amplitude: 5.6 mV
Lead Channel Setting Pacing Amplitude: 2 V
Lead Channel Setting Pacing Pulse Width: 0.4 ms
Lead Channel Setting Sensing Sensitivity: 2.8 mV
MDC IDC LEAD IMPLANT DT: 20020701
MDC IDC LEAD SERIAL: 313853
MDC IDC LEAD SERIAL: 323286
MDC IDC MSMT BATTERY IMPEDANCE: 323 Ohm
MDC IDC MSMT BATTERY REMAINING LONGEVITY: 114 mo
MDC IDC MSMT BATTERY VOLTAGE: 2.8 V
MDC IDC SESS DTM: 20170206155404
MDC IDC SET LEADCHNL RV PACING AMPLITUDE: 2.5 V
MDC IDC STAT BRADY AP VP PERCENT: 1 %
MDC IDC STAT BRADY AS VP PERCENT: 2 %

## 2015-12-25 ENCOUNTER — Encounter: Payer: Self-pay | Admitting: Cardiology

## 2015-12-31 ENCOUNTER — Encounter: Payer: Self-pay | Admitting: Women's Health

## 2015-12-31 ENCOUNTER — Ambulatory Visit (INDEPENDENT_AMBULATORY_CARE_PROVIDER_SITE_OTHER): Payer: Medicare Other | Admitting: Women's Health

## 2015-12-31 VITALS — BP 122/80 | Ht 66.0 in | Wt 178.0 lb

## 2015-12-31 DIAGNOSIS — Z79890 Hormone replacement therapy (postmenopausal): Secondary | ICD-10-CM | POA: Diagnosis not present

## 2015-12-31 DIAGNOSIS — Z01419 Encounter for gynecological examination (general) (routine) without abnormal findings: Secondary | ICD-10-CM | POA: Diagnosis not present

## 2015-12-31 MED ORDER — ESTRADIOL 0.5 MG PO TABS: 0.5000 mg | ORAL_TABLET | Freq: Every day | ORAL | Status: DC

## 2015-12-31 NOTE — Patient Instructions (Signed)

## 2015-12-31 NOTE — Progress Notes (Signed)
Judy Chapman December 25, 1945 [id number]    History:    Presents for breast and pelvic exam. TVH for endometriosis on estradiol 0.25 daily. Normal Pap and mammogram history. Primary care manages hypertension and DEXA. History of a pacemaker cardiologist manages.   Past medical history, past surgical history, family history and social history were all reviewed and documented in the EPIC chart. 2 daughters both doing well, dog Vira Agar having some behavior issues.  ROS:  A ROS was performed and pertinent positives and negatives are included.  Exam:  Filed Vitals:   12/31/15 1031  BP: 122/80    General appearance:  Normal Thyroid:  Symmetrical, normal in size, without palpable masses or nodularity. Respiratory  Auscultation:  Clear without wheezing or rhonchi Cardiovascular  Auscultation:  Regular rate, without rubs, murmurs or gallops  Edema/varicosities:  Not grossly evident Abdominal  Soft,nontender, without masses, guarding or rebound.  Liver/spleen:  No organomegaly noted  Hernia:  None appreciated  Skin  Inspection:  Grossly normal   Breasts: Examined lying and sitting.     Right: Without masses, retractions, discharge or axillary adenopathy.     Left: Without masses, retractions, discharge or axillary adenopathy. Gentitourinary   Inguinal/mons:  Normal without inguinal adenopathy  External genitalia:  Normal  BUS/Urethra/Skene's glands:  Normal  Vagina:  Normal  Cervix: And uterus absent Adnexa/parametria:     Rt: Without masses or tenderness.   Lt: Without masses or tenderness.  Anus and perineum: Normal  Digital rectal exam: Normal sphincter tone without palpated masses or tenderness  Assessment/Plan:  70 y.o. MBF G2 P2 for breast and pelvic exam with no complaints.  TVH for endometriosis on estradiol 0.25 daily Hypertension and osteopenia-primary care manages labs and meds Pacemaker-cardiologist  Plan: HRT reviewed risks of blood clots, strokes and breast cancer  currently on estradiol 0.25 daily Will continue to wean off and stop estradiol this year. SBE's, continue annual 3-D's screening mammogram history of dense breasts. Reviewed importance of regular exercise, home safety and fall prevention discussed. Current on vaccines.      [name] WHNP, 1:06 PM 12/31/2015

## 2016-03-02 ENCOUNTER — Telehealth: Payer: Self-pay | Admitting: Cardiology

## 2016-03-02 ENCOUNTER — Ambulatory Visit (INDEPENDENT_AMBULATORY_CARE_PROVIDER_SITE_OTHER): Payer: Medicare Other | Admitting: *Deleted

## 2016-03-02 DIAGNOSIS — I495 Sick sinus syndrome: Secondary | ICD-10-CM | POA: Diagnosis not present

## 2016-03-02 NOTE — Telephone Encounter (Signed)
Spoke with pt and reminded pt of remote transmission that is due today. Pt verbalized understanding.   

## 2016-03-03 NOTE — Progress Notes (Signed)
Remote pacemaker transmission.   

## 2016-04-02 LAB — CUP PACEART REMOTE DEVICE CHECK
Battery Impedance: 323 Ohm
Battery Remaining Longevity: 114 mo
Battery Voltage: 2.8 V
Brady Statistic AP VP Percent: 1 %
Brady Statistic AS VP Percent: 1 %
Date Time Interrogation Session: 20170508174913
Implantable Lead Implant Date: 20020701
Implantable Lead Location: 753859
Implantable Lead Location: 753860
Implantable Lead Model: 4469
Implantable Lead Model: 4470
Implantable Lead Serial Number: 313853
Lead Channel Impedance Value: 466 Ohm
Lead Channel Pacing Threshold Amplitude: 0.5 V
Lead Channel Pacing Threshold Amplitude: 0.625 V
Lead Channel Pacing Threshold Pulse Width: 0.4 ms
Lead Channel Setting Pacing Pulse Width: 0.4 ms
MDC IDC LEAD IMPLANT DT: 20020701
MDC IDC LEAD SERIAL: 323286
MDC IDC MSMT LEADCHNL RV IMPEDANCE VALUE: 550 Ohm
MDC IDC MSMT LEADCHNL RV PACING THRESHOLD PULSEWIDTH: 0.4 ms
MDC IDC MSMT LEADCHNL RV SENSING INTR AMPL: 5.6 mV — AB
MDC IDC SET LEADCHNL RA PACING AMPLITUDE: 2 V
MDC IDC SET LEADCHNL RV PACING AMPLITUDE: 2.5 V
MDC IDC SET LEADCHNL RV SENSING SENSITIVITY: 2.8 mV
MDC IDC STAT BRADY AP VS PERCENT: 80 %
MDC IDC STAT BRADY AS VS PERCENT: 18 %

## 2016-04-08 ENCOUNTER — Encounter: Payer: Self-pay | Admitting: Cardiology

## 2016-04-22 ENCOUNTER — Encounter: Payer: Self-pay | Admitting: Cardiology

## 2016-07-03 ENCOUNTER — Encounter: Payer: Self-pay | Admitting: Internal Medicine

## 2016-07-17 ENCOUNTER — Encounter: Payer: Self-pay | Admitting: Internal Medicine

## 2016-07-17 ENCOUNTER — Ambulatory Visit (INDEPENDENT_AMBULATORY_CARE_PROVIDER_SITE_OTHER): Payer: Medicare Other | Admitting: Internal Medicine

## 2016-07-17 VITALS — BP 118/78 | HR 83 | Ht 65.0 in | Wt 181.2 lb

## 2016-07-17 DIAGNOSIS — Z950 Presence of cardiac pacemaker: Secondary | ICD-10-CM

## 2016-07-17 DIAGNOSIS — I495 Sick sinus syndrome: Secondary | ICD-10-CM

## 2016-07-17 NOTE — Patient Instructions (Signed)
Medication Instructions: Your physician recommends that you continue on your current medications as directed. Please refer to the Current Medication list given to you today.   Labwork: None Ordered  Procedures/Testing: None Ordered  Follow-Up: Your physician wants you to follow-up in 1 YEAR with Marinus Maw. You will receive a reminder letter in the mail two months in advance. If you don't receive a letter, please call our office to schedule the follow-up appointment.   Any Additional Special Instructions Will Be Listed Below (If Applicable).     If you need a refill on your cardiac medications before your next appointment, please call your pharmacy.

## 2016-07-17 NOTE — Progress Notes (Signed)
Patient Care Team: Crist Infante, MD as PCP - General (Internal Medicine)   HPI  Judy Chapman is a 70 y.o. 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 It was initially implanted in 2002 with device generator replacement March 2010  by Dr. Doreatha Lew.    The patient denies chest pain, shortness of breath, nocturnal dyspnea, orthopnea or peripheral edema.  There have been no palpitations, lightheadedness or syncope  She had an episode of gout that she her. She was put on allopurinol which she was not able to tolerate. She takes a thiazide diuretic.  Echo 2006 demonstrated normal LV function  Her blood pressure which she recorded at home runs 115 range.    Past Medical History:  Diagnosis Date  . ACE inhibitor intolerance   . Endometriosis   . Osteopenia   . Pacemaker-MDT    DOI-2002, Generator change 2010  . Pneumonia   . PVC (premature ventricular contraction)   . Sinus node dysfunction [facility])     Past Surgical History:  Procedure Laterality Date  . BREAST LUMPECTOMY     Benign  . CORONARY ANGIOPLASTY    . DILATION AND CURETTAGE OF UTERUS    . GALLBLADDER SURGERY    . HYSTEROSCOPY    . PACEMAKER INSERTION     Medtronic  . TUBAL LIGATION    . VAGINAL HYSTERECTOMY      Current Outpatient Prescriptions  Medication Sig Dispense Refill  . aspirin 81 MG tablet Take 81 mg by mouth every other day.     . Cholecalciferol (VITAMIN D PO) Take 1 tablet by mouth daily.     . citalopram (CELEXA) 20 MG tablet Take 1 tablet (20 mg total) by mouth daily. 90 tablet 2  . estradiol (ESTRACE) 0.5 MG tablet Take 1 tablet (0.5 mg total) by mouth daily. 90 tablet 4  . nebivolol (BYSTOLIC) 10 MG tablet Take 10 mg by mouth daily.    Marland Kitchen triamterene-hydrochlorothiazide (DYAZIDE) 37.5-25 MG per capsule Take 1 capsule by mouth daily.    Marland Kitchen ULORIC 40 MG tablet Take 1 tablet by mouth daily.  0   No current facility-administered medications for this  visit.     Allergies  Allergen Reactions  . Augmentin [Amoxicillin-Pot Clavulanate]     diarrhea  . Ramipril Swelling    Review of Systems negative except from HPI and PMH  Physical Exam BP 118/78   Pulse 83   Ht 5\' 5"  (1.651 m)   Wt 181 lb 3.2 oz (82.2 kg)   SpO2 96%   BMI 30.15 kg/m  Well developed and well nourished in no acute distress HENT normal E scleral and icterus clear Neck Supple JVP flat; carotids brisk and full Clear to ausculation  Regular rate and rhythm, no murmurs gallops or rub Soft with active bowel sounds No clubbing cyanosis  Edema Alert and oriented, grossly normal motor and sensory function Skin Warm and Dry  ECG demonstrates atrial pacing 68 Intervals 22/09/42+ axis left -20  Low-voltage  Assessment and  Plan Hypertension  Well controlled   Sinus node dysfunction \ Gout  Pacemaker-Medtronic  The patient's device was interrogated.  The information was reviewed. No changes were made in the programming.      Blood pressure is well-controlled  I wonder whether we could get her off of her thiazide diuretic and thereby not need a whole compendium of medications to deal with the consequences of it. Her blood pressure is  noted and is quite low. I was wondering if her diuretic abuse as needed and her anti-uric acid medications used if she failed this withdrawal regime. I will discuss this with Dr. Joylene Draft   exercise tolerance is quite good and chronotropic competence is accomplished with pacing

## 2016-07-23 LAB — CUP PACEART INCLINIC DEVICE CHECK
Battery Remaining Longevity: 101 mo
Brady Statistic AP VP Percent: 1 %
Brady Statistic AS VS Percent: 17 %
Date Time Interrogation Session: 20170922195328
Implantable Lead Implant Date: 20020701
Implantable Lead Location: 753860
Implantable Lead Serial Number: 313853
Implantable Lead Serial Number: 323286
Lead Channel Pacing Threshold Amplitude: 0.5 V
Lead Channel Pacing Threshold Amplitude: 0.5 V
Lead Channel Pacing Threshold Pulse Width: 0.4 ms
Lead Channel Sensing Intrinsic Amplitude: 5.6 mV
Lead Channel Setting Pacing Amplitude: 2 V
Lead Channel Setting Pacing Amplitude: 2.5 V
Lead Channel Setting Pacing Pulse Width: 0.4 ms
Lead Channel Setting Sensing Sensitivity: 2.8 mV
MDC IDC LEAD IMPLANT DT: 20020701
MDC IDC LEAD LOCATION: 753859
MDC IDC MSMT BATTERY IMPEDANCE: 371 Ohm
MDC IDC MSMT BATTERY VOLTAGE: 2.79 V
MDC IDC MSMT LEADCHNL RA IMPEDANCE VALUE: 487 Ohm
MDC IDC MSMT LEADCHNL RA PACING THRESHOLD PULSEWIDTH: 0.4 ms
MDC IDC MSMT LEADCHNL RA SENSING INTR AMPL: 2 mV
MDC IDC MSMT LEADCHNL RV IMPEDANCE VALUE: 587 Ohm
MDC IDC STAT BRADY AP VS PERCENT: 81 %
MDC IDC STAT BRADY AS VP PERCENT: 1 %

## 2016-10-20 ENCOUNTER — Telehealth: Payer: Self-pay | Admitting: Cardiology

## 2016-10-20 ENCOUNTER — Ambulatory Visit (INDEPENDENT_AMBULATORY_CARE_PROVIDER_SITE_OTHER): Payer: Medicare Other | Admitting: *Deleted

## 2016-10-20 DIAGNOSIS — I495 Sick sinus syndrome: Secondary | ICD-10-CM | POA: Diagnosis not present

## 2016-10-20 NOTE — Telephone Encounter (Signed)
LMOVM reminding pt to send remote transmission.   

## 2016-10-21 NOTE — Progress Notes (Signed)
Remote pacemaker transmission.   

## 2016-10-22 LAB — CUP PACEART REMOTE DEVICE CHECK
Battery Impedance: 420 Ohm
Battery Remaining Longevity: 96 mo
Battery Voltage: 2.8 V
Brady Statistic AS VS Percent: 15 %
Implantable Lead Implant Date: 20020701
Implantable Lead Model: 4469
Implantable Lead Serial Number: 313853
Lead Channel Impedance Value: 547 Ohm
Lead Channel Setting Pacing Amplitude: 2 V
Lead Channel Setting Pacing Amplitude: 2.5 V
Lead Channel Setting Pacing Pulse Width: 0.4 ms
Lead Channel Setting Sensing Sensitivity: 2.8 mV
MDC IDC LEAD IMPLANT DT: 20020701
MDC IDC LEAD LOCATION: 753859
MDC IDC LEAD LOCATION: 753860
MDC IDC LEAD SERIAL: 323286
MDC IDC MSMT LEADCHNL RA IMPEDANCE VALUE: 468 Ohm
MDC IDC MSMT LEADCHNL RA PACING THRESHOLD AMPLITUDE: 0.75 V
MDC IDC MSMT LEADCHNL RA PACING THRESHOLD PULSEWIDTH: 0.4 ms
MDC IDC MSMT LEADCHNL RV PACING THRESHOLD AMPLITUDE: 0.75 V
MDC IDC MSMT LEADCHNL RV PACING THRESHOLD PULSEWIDTH: 0.4 ms
MDC IDC PG IMPLANT DT: 20100908
MDC IDC SESS DTM: 20171226175349
MDC IDC STAT BRADY AP VP PERCENT: 1 %
MDC IDC STAT BRADY AP VS PERCENT: 83 %
MDC IDC STAT BRADY AS VP PERCENT: 1 %

## 2016-10-23 ENCOUNTER — Encounter: Payer: Self-pay | Admitting: Cardiology

## 2016-11-25 ENCOUNTER — Other Ambulatory Visit: Payer: Self-pay | Admitting: Women's Health

## 2016-11-25 DIAGNOSIS — Z1231 Encounter for screening mammogram for malignant neoplasm of breast: Secondary | ICD-10-CM

## 2016-12-25 ENCOUNTER — Encounter: Payer: Self-pay | Admitting: Women's Health

## 2016-12-25 ENCOUNTER — Ambulatory Visit
Admission: RE | Admit: 2016-12-25 | Discharge: 2016-12-25 | Disposition: A | Discharge status: Home / Self Care | Payer: Medicare Other | Source: Ambulatory Visit | Attending: Women's Health | Admitting: Women's Health

## 2016-12-25 DIAGNOSIS — Z1231 Encounter for screening mammogram for malignant neoplasm of breast: Secondary | ICD-10-CM

## 2016-12-29 ENCOUNTER — Telehealth: Payer: Self-pay | Admitting: *Deleted

## 2016-12-29 NOTE — Telephone Encounter (Signed)
PA done online via cover my meds for estradiol 0.5 mg dose approved through 10/25/17

## 2017-01-19 ENCOUNTER — Ambulatory Visit (INDEPENDENT_AMBULATORY_CARE_PROVIDER_SITE_OTHER): Payer: Medicare Other | Admitting: *Deleted

## 2017-01-19 DIAGNOSIS — I495 Sick sinus syndrome: Secondary | ICD-10-CM | POA: Diagnosis not present

## 2017-01-20 LAB — CUP PACEART REMOTE DEVICE CHECK
Battery Impedance: 493 Ohm
Battery Remaining Longevity: 91 mo
Battery Voltage: 2.8 V
Brady Statistic AS VS Percent: 17 %
Date Time Interrogation Session: 20180327124156
Implantable Lead Implant Date: 20020701
Implantable Lead Location: 753860
Implantable Lead Model: 4469
Implantable Lead Model: 4470
Implantable Lead Serial Number: 313853
Lead Channel Pacing Threshold Pulse Width: 0.4 ms
Lead Channel Pacing Threshold Pulse Width: 0.4 ms
Lead Channel Setting Pacing Amplitude: 2 V
Lead Channel Setting Pacing Amplitude: 2.5 V
Lead Channel Setting Pacing Pulse Width: 0.4 ms
Lead Channel Setting Sensing Sensitivity: 2.8 mV
MDC IDC LEAD IMPLANT DT: 20020701
MDC IDC LEAD LOCATION: 753859
MDC IDC LEAD SERIAL: 323286
MDC IDC MSMT LEADCHNL RA IMPEDANCE VALUE: 480 Ohm
MDC IDC MSMT LEADCHNL RA PACING THRESHOLD AMPLITUDE: 0.75 V
MDC IDC MSMT LEADCHNL RV IMPEDANCE VALUE: 595 Ohm
MDC IDC MSMT LEADCHNL RV PACING THRESHOLD AMPLITUDE: 0.75 V
MDC IDC PG IMPLANT DT: 20100908
MDC IDC STAT BRADY AP VP PERCENT: 1 %
MDC IDC STAT BRADY AP VS PERCENT: 82 %
MDC IDC STAT BRADY AS VP PERCENT: 1 %

## 2017-01-20 NOTE — Progress Notes (Signed)
Remote pacemaker transmission.   

## 2017-01-22 ENCOUNTER — Encounter: Payer: Self-pay | Admitting: Cardiology

## 2017-01-29 ENCOUNTER — Other Ambulatory Visit: Payer: Self-pay | Admitting: Women's Health

## 2017-01-29 DIAGNOSIS — Z79890 Hormone replacement therapy (postmenopausal): Secondary | ICD-10-CM

## 2017-02-01 NOTE — Telephone Encounter (Signed)
Rx sent.will be called for annual exam. KW CMA

## 2017-02-05 ENCOUNTER — Encounter: Payer: Self-pay | Admitting: Cardiology

## 2017-02-10 ENCOUNTER — Encounter: Payer: Medicare Other | Admitting: Women's Health

## 2017-02-16 ENCOUNTER — Ambulatory Visit (INDEPENDENT_AMBULATORY_CARE_PROVIDER_SITE_OTHER): Payer: Medicare Other | Admitting: Women's Health

## 2017-02-16 ENCOUNTER — Encounter: Payer: Self-pay | Admitting: Women's Health

## 2017-02-16 VITALS — BP 126/86 | Ht 64.5 in | Wt 180.0 lb

## 2017-02-16 DIAGNOSIS — Z79890 Hormone replacement therapy (postmenopausal): Secondary | ICD-10-CM | POA: Diagnosis not present

## 2017-02-16 DIAGNOSIS — Z01419 Encounter for gynecological examination (general) (routine) without abnormal findings: Secondary | ICD-10-CM | POA: Diagnosis not present

## 2017-02-16 MED ORDER — ESTRADIOL 0.5 MG PO TABS: 0.5000 mg | ORAL_TABLET | Freq: Every day | ORAL | 4 refills | Status: DC

## 2017-02-16 NOTE — Patient Instructions (Signed)
Health Maintenance for Postmenopausal Women Menopause is a normal process in which your reproductive ability comes to an end. This process happens gradually over a span of months to years, usually between the ages of 33 and 38. Menopause is complete when you have missed 12 consecutive menstrual periods. It is important to talk with your health care provider about some of the most common conditions that affect postmenopausal women, such as heart disease, cancer, and bone loss (osteoporosis). Adopting a healthy lifestyle and getting preventive care can help to promote your health and wellness. Those actions can also lower your chances of developing some of these common conditions. What should I know about menopause? During menopause, you may experience a number of symptoms, such as:  Moderate-to-severe hot flashes.  Night sweats.  Decrease in sex drive.  Mood swings.  Headaches.  Tiredness.  Irritability.  Memory problems.  Insomnia. Choosing to treat or not to treat menopausal changes is an individual decision that you make with your health care provider. What should I know about hormone replacement therapy and supplements? Hormone therapy products are effective for treating symptoms that are associated with menopause, such as hot flashes and night sweats. Hormone replacement carries certain risks, especially as you become older. If you are thinking about using estrogen or estrogen with progestin treatments, discuss the benefits and risks with your health care provider. What should I know about heart disease and stroke? Heart disease, heart attack, and stroke become more likely as you age. This may be due, in part, to the hormonal changes that your body experiences during menopause. These can affect how your body processes dietary fats, triglycerides, and cholesterol. Heart attack and stroke are both medical emergencies. There are many things that you can do to help prevent heart disease  and stroke:  Have your blood pressure checked at least every 1-2 years. High blood pressure causes heart disease and increases the risk of stroke.  If you are 48-61 years old, ask your health care provider if you should take aspirin to prevent a heart attack or a stroke.  Do not use any tobacco products, including cigarettes, chewing tobacco, or electronic cigarettes. If you need help quitting, ask your health care provider.  It is important to eat a healthy diet and maintain a healthy weight.  Be sure to include plenty of vegetables, fruits, low-fat dairy products, and lean protein.  Avoid eating foods that are high in solid fats, added sugars, or salt (sodium).  Get regular exercise. This is one of the most important things that you can do for your health.  Try to exercise for at least 150 minutes each week. The type of exercise that you do should increase your heart rate and make you sweat. This is known as moderate-intensity exercise.  Try to do strengthening exercises at least twice each week. Do these in addition to the moderate-intensity exercise.  Know your numbers.Ask your health care provider to check your cholesterol and your blood glucose. Continue to have your blood tested as directed by your health care provider. What should I know about cancer screening? There are several types of cancer. Take the following steps to reduce your risk and to catch any cancer development as early as possible. Breast Cancer  Practice breast self-awareness.  This means understanding how your breasts normally appear and feel.  It also means doing regular breast self-exams. Let your health care provider know about any changes, no matter how small.  If you are 40 or older,  have a clinician do a breast exam (clinical breast exam or CBE) every year. Depending on your age, family history, and medical history, it may be recommended that you also have a yearly breast X-ray (mammogram).  If you  have a family history of breast cancer, talk with your health care provider about genetic screening.  If you are at high risk for breast cancer, talk with your health care provider about having an MRI and a mammogram every year.  Breast cancer (BRCA) gene test is recommended for women who have family members with BRCA-related cancers. Results of the assessment will determine the need for genetic counseling and BRCA1 and for BRCA2 testing. BRCA-related cancers include these types:  Breast. This occurs in males or females.  Ovarian.  Tubal. This may also be called fallopian tube cancer.  Cancer of the abdominal or pelvic lining (peritoneal cancer).  Prostate.  Pancreatic. Cervical, Uterine, and Ovarian Cancer  Your health care provider may recommend that you be screened regularly for cancer of the pelvic organs. These include your ovaries, uterus, and vagina. This screening involves a pelvic exam, which includes checking for microscopic changes to the surface of your cervix (Pap test).  For women ages 21-65, health care providers may recommend a pelvic exam and a Pap test every three years. For women ages 23-65, they may recommend the Pap test and pelvic exam, combined with testing for human papilloma virus (HPV), every five years. Some types of HPV increase your risk of cervical cancer. Testing for HPV may also be done on women of any age who have unclear Pap test results.  Other health care providers may not recommend any screening for nonpregnant women who are considered low risk for pelvic cancer and have no symptoms. Ask your health care provider if a screening pelvic exam is right for you.  If you have had past treatment for cervical cancer or a condition that could lead to cancer, you need Pap tests and screening for cancer for at least 20 years after your treatment. If Pap tests have been discontinued for you, your risk factors (such as having a new sexual partner) need to be reassessed  to determine if you should start having screenings again. Some women have medical problems that increase the chance of getting cervical cancer. In these cases, your health care provider may recommend that you have screening and Pap tests more often.  If you have a family history of uterine cancer or ovarian cancer, talk with your health care provider about genetic screening.  If you have vaginal bleeding after reaching menopause, tell your health care provider.  There are currently no reliable tests available to screen for ovarian cancer. Lung Cancer  Lung cancer screening is recommended for adults 99-83 years old who are at high risk for lung cancer because of a history of smoking. A yearly low-dose CT scan of the lungs is recommended if you:  Currently smoke.  Have a history of at least 30 pack-years of smoking and you currently smoke or have quit within the past 15 years. A pack-year is smoking an average of one pack of cigarettes per day for one year. Yearly screening should:  Continue until it has been 15 years since you quit.  Stop if you develop a health problem that would prevent you from having lung cancer treatment. Colorectal Cancer  This type of cancer can be detected and can often be prevented.  Routine colorectal cancer screening usually begins at age 72 and continues  through age 75.  If you have risk factors for colon cancer, your health care provider may recommend that you be screened at an earlier age.  If you have a family history of colorectal cancer, talk with your health care provider about genetic screening.  Your health care provider may also recommend using home test kits to check for hidden blood in your stool.  A small camera at the end of a tube can be used to examine your colon directly (sigmoidoscopy or colonoscopy). This is done to check for the earliest forms of colorectal cancer.  Direct examination of the colon should be repeated every 5-10 years until  age 75. However, if early forms of precancerous polyps or small growths are found or if you have a family history or genetic risk for colorectal cancer, you may need to be screened more often. Skin Cancer  Check your skin from head to toe regularly.  Monitor any moles. Be sure to tell your health care provider:  About any new moles or changes in moles, especially if there is a change in a mole's shape or color.  If you have a mole that is larger than the size of a pencil eraser.  If any of your family members has a history of skin cancer, especially at a young age, talk with your health care provider about genetic screening.  Always use sunscreen. Apply sunscreen liberally and repeatedly throughout the day.  Whenever you are outside, protect yourself by wearing long sleeves, pants, a wide-brimmed hat, and sunglasses. What should I know about osteoporosis? Osteoporosis is a condition in which bone destruction happens more quickly than new bone creation. After menopause, you may be at an increased risk for osteoporosis. To help prevent osteoporosis or the bone fractures that can happen because of osteoporosis, the following is recommended:  If you are 19-50 years old, get at least 1,000 mg of calcium and at least 600 mg of vitamin D per day.  If you are older than age 50 but younger than age 70, get at least 1,200 mg of calcium and at least 600 mg of vitamin D per day.  If you are older than age 70, get at least 1,200 mg of calcium and at least 800 mg of vitamin D per day. Smoking and excessive alcohol intake increase the risk of osteoporosis. Eat foods that are rich in calcium and vitamin D, and do weight-bearing exercises several times each week as directed by your health care provider. What should I know about how menopause affects my mental health? Depression may occur at any age, but it is more common as you become older. Common symptoms of depression include:  Low or sad  mood.  Changes in sleep patterns.  Changes in appetite or eating patterns.  Feeling an overall lack of motivation or enjoyment of activities that you previously enjoyed.  Frequent crying spells. Talk with your health care provider if you think that you are experiencing depression. What should I know about immunizations? It is important that you get and maintain your immunizations. These include:  Tetanus, diphtheria, and pertussis (Tdap) booster vaccine.  Influenza every year before the flu season begins.  Pneumonia vaccine.  Shingles vaccine. Your health care provider may also recommend other immunizations. This information is not intended to replace advice given to you by your health care provider. Make sure you discuss any questions you have with your health care provider. Document Released: 12/04/2005 Document Revised: 05/01/2016 Document Reviewed: 07/16/2015 Elsevier Interactive Patient   Education  2017 [employer]  

## 2017-02-16 NOTE — Progress Notes (Signed)
Judy Chapman April 08, 1946 [id number]    History:    Presents for pelvic and breast exam. Postmenopausal on estradiol 0.25 daily,   TVH for endometriosis . Normal Pap and mammogram history. Primary care manages hypertension and DEXA. History of a pacemaker cardiologist manages. Sexually active without complaint. Current on immunizations.  Past medical history, past surgical history, family history and social history were all reviewed and documented in the EPIC chart. Retired, has 2 daughters.   ROS:  A ROS was performed and pertinent positives and negatives are included.  Exam:  Vitals:   02/16/17 1225  BP: 126/86  Weight: 180 lb (81.6 kg)  Height: 5' 4.5" (1.638 m)   Body mass index is 30.42 kg/m.   General appearance:  Normal Thyroid:  Symmetrical, normal in size, without palpable masses or nodularity. Respiratory  Auscultation:  Clear without wheezing or rhonchi Cardiovascular  Auscultation:  Regular rate, without rubs, murmurs or gallops  Edema/varicosities:  Not grossly evident Abdominal  Soft,nontender, without masses, guarding or rebound.  Liver/spleen:  No organomegaly noted  Hernia:  None appreciated  Skin  Inspection:  Grossly normal   Breasts: Examined lying and sitting.     Right: Without masses, retractions, discharge or axillary adenopathy.     Left: Without masses, retractions, discharge or axillary adenopathy. Gentitourinary   Inguinal/mons:  Normal without inguinal adenopathy  External genitalia:  Normal  BUS/Urethra/Skene's glands:  Normal  Vagina:  Normal  Cervix:  absent  Uterus:  absent  Adnexa/parametria:     Rt: Without masses or tenderness.   Lt: Without masses or tenderness.  Anus and perineum: Normal  Digital rectal exam: Normal sphincter tone without palpated masses or tenderness  Assessment/Plan:  71 y.o. MBF G2 P2 for breast and pelvic exam with no complaints.  TVH for endometriosis on estradiol 0.25 daily Hypertension,  Anxiety/depression and osteopenia-primary care manages labs and meds Pacemaker-cardiologist   Plan: HRT reviewed risks of blood clots, strokes and breast cancer currently on estradiol 0.25 daily, prefers to continue SBE's, continue annual 3-D's screening mammogram history of dense breasts. Reviewed importance of regular exercise, home safety and fall prevention discussed. Healthy lifestyle, diet and exercise. Calcium rich diet, Vit. D 2000 U daily.       [place], 12:56 PM 02/16/2017

## 2017-04-20 ENCOUNTER — Ambulatory Visit (INDEPENDENT_AMBULATORY_CARE_PROVIDER_SITE_OTHER): Payer: Medicare Other | Admitting: *Deleted

## 2017-04-20 ENCOUNTER — Telehealth: Payer: Self-pay | Admitting: Cardiology

## 2017-04-20 DIAGNOSIS — I495 Sick sinus syndrome: Secondary | ICD-10-CM

## 2017-04-20 NOTE — Progress Notes (Signed)
Remote pacemaker transmission.   

## 2017-04-20 NOTE — Telephone Encounter (Signed)
Spoke with pt and reminded pt of remote transmission that is due today. Pt verbalized understanding.   

## 2017-04-21 ENCOUNTER — Encounter: Payer: Self-pay | Admitting: Cardiology

## 2017-04-21 LAB — CUP PACEART REMOTE DEVICE CHECK
Battery Impedance: 518 Ohm
Brady Statistic AP VP Percent: 1 %
Brady Statistic AP VS Percent: 81 %
Brady Statistic AS VP Percent: 1 %
Brady Statistic AS VS Percent: 17 %
Implantable Lead Implant Date: 20020701
Implantable Lead Model: 4469
Implantable Lead Model: 4470
Implantable Lead Serial Number: 323286
Lead Channel Impedance Value: 460 Ohm
Lead Channel Impedance Value: 521 Ohm
Lead Channel Pacing Threshold Amplitude: 0.625 V
Lead Channel Pacing Threshold Pulse Width: 0.4 ms
Lead Channel Setting Pacing Amplitude: 2.5 V
Lead Channel Setting Sensing Sensitivity: 2.8 mV
MDC IDC LEAD IMPLANT DT: 20020701
MDC IDC LEAD LOCATION: 753859
MDC IDC LEAD LOCATION: 753860
MDC IDC LEAD SERIAL: 313853
MDC IDC MSMT BATTERY REMAINING LONGEVITY: 88 mo
MDC IDC MSMT BATTERY VOLTAGE: 2.8 V
MDC IDC MSMT LEADCHNL RV PACING THRESHOLD AMPLITUDE: 0.75 V
MDC IDC MSMT LEADCHNL RV PACING THRESHOLD PULSEWIDTH: 0.4 ms
MDC IDC PG IMPLANT DT: 20100908
MDC IDC SESS DTM: 20180626165102
MDC IDC SET LEADCHNL RA PACING AMPLITUDE: 2 V
MDC IDC SET LEADCHNL RV PACING PULSEWIDTH: 0.4 ms

## 2017-05-05 ENCOUNTER — Encounter: Payer: Self-pay | Admitting: Cardiology

## 2017-07-09 ENCOUNTER — Encounter: Payer: Self-pay | Admitting: Internal Medicine

## 2017-07-20 ENCOUNTER — Ambulatory Visit (INDEPENDENT_AMBULATORY_CARE_PROVIDER_SITE_OTHER): Payer: Medicare Other | Admitting: *Deleted

## 2017-07-20 DIAGNOSIS — I495 Sick sinus syndrome: Secondary | ICD-10-CM

## 2017-07-20 NOTE — Progress Notes (Signed)
Remote pacemaker transmission.   

## 2017-07-22 ENCOUNTER — Encounter: Payer: Self-pay | Admitting: Cardiology

## 2017-07-22 LAB — CUP PACEART REMOTE DEVICE CHECK
Battery Impedance: 568 Ohm
Battery Remaining Longevity: 85 mo
Battery Voltage: 2.79 V
Brady Statistic AP VP Percent: 1 %
Brady Statistic AP VS Percent: 82 %
Implantable Lead Implant Date: 20020701
Implantable Lead Location: 753859
Implantable Lead Model: 4469
Implantable Lead Model: 4470
Implantable Lead Serial Number: 313853
Implantable Lead Serial Number: 323286
Lead Channel Impedance Value: 466 Ohm
Lead Channel Impedance Value: 564 Ohm
Lead Channel Pacing Threshold Pulse Width: 0.4 ms
Lead Channel Setting Pacing Amplitude: 2.5 V
Lead Channel Setting Pacing Pulse Width: 0.4 ms
Lead Channel Setting Sensing Sensitivity: 2 mV
MDC IDC LEAD IMPLANT DT: 20020701
MDC IDC LEAD LOCATION: 753860
MDC IDC MSMT LEADCHNL RA PACING THRESHOLD AMPLITUDE: 0.75 V
MDC IDC MSMT LEADCHNL RV PACING THRESHOLD AMPLITUDE: 0.75 V
MDC IDC MSMT LEADCHNL RV PACING THRESHOLD PULSEWIDTH: 0.4 ms
MDC IDC PG IMPLANT DT: 20100908
MDC IDC SESS DTM: 20180925150225
MDC IDC SET LEADCHNL RA PACING AMPLITUDE: 2 V
MDC IDC STAT BRADY AS VP PERCENT: 1 %
MDC IDC STAT BRADY AS VS PERCENT: 16 %

## 2017-07-23 ENCOUNTER — Encounter: Payer: Self-pay | Admitting: Internal Medicine

## 2017-07-23 ENCOUNTER — Ambulatory Visit (INDEPENDENT_AMBULATORY_CARE_PROVIDER_SITE_OTHER): Payer: Medicare Other | Admitting: Internal Medicine

## 2017-07-23 VITALS — BP 144/86 | HR 84 | Ht 64.0 in | Wt 179.0 lb

## 2017-07-23 DIAGNOSIS — I495 Sick sinus syndrome: Secondary | ICD-10-CM

## 2017-07-23 DIAGNOSIS — Z950 Presence of cardiac pacemaker: Secondary | ICD-10-CM

## 2017-07-23 LAB — CUP PACEART INCLINIC DEVICE CHECK
Battery Impedance: 542 Ohm
Battery Voltage: 2.8 V
Brady Statistic AP VS Percent: 82 %
Brady Statistic AS VP Percent: 1 %
Implantable Lead Implant Date: 20020701
Implantable Lead Location: 753859
Implantable Lead Model: 4469
Implantable Lead Model: 4470
Implantable Lead Serial Number: 323286
Implantable Pulse Generator Implant Date: 20100908
Lead Channel Impedance Value: 466 Ohm
Lead Channel Impedance Value: 557 Ohm
Lead Channel Pacing Threshold Amplitude: 0.625 V
Lead Channel Pacing Threshold Pulse Width: 0.4 ms
Lead Channel Sensing Intrinsic Amplitude: 2 mV
MDC IDC LEAD IMPLANT DT: 20020701
MDC IDC LEAD LOCATION: 753860
MDC IDC LEAD SERIAL: 313853
MDC IDC MSMT BATTERY REMAINING LONGEVITY: 86 mo
MDC IDC MSMT LEADCHNL RV PACING THRESHOLD AMPLITUDE: 0.75 V
MDC IDC MSMT LEADCHNL RV PACING THRESHOLD PULSEWIDTH: 0.4 ms
MDC IDC MSMT LEADCHNL RV SENSING INTR AMPL: 5.6 mV
MDC IDC SESS DTM: 20180928145816
MDC IDC SET LEADCHNL RA PACING AMPLITUDE: 2 V
MDC IDC SET LEADCHNL RV PACING AMPLITUDE: 2.5 V
MDC IDC SET LEADCHNL RV PACING PULSEWIDTH: 0.4 ms
MDC IDC SET LEADCHNL RV SENSING SENSITIVITY: 2.8 mV
MDC IDC STAT BRADY AP VP PERCENT: 1 %
MDC IDC STAT BRADY AS VS PERCENT: 16 %

## 2017-07-23 NOTE — Patient Instructions (Addendum)
Medication Instructions: Your physician recommends that you continue on your current medications as directed. Please refer to the Current Medication list given to you today.  Labwork: None ordered.  Procedures/Testing: None ordered  Follow-Up: Your physician wants you to follow-up in: 1 year with Dr. Caryl Comes.   You will receive a reminder letter in the mail two months in advance. If you don't receive a letter, please call our office to schedule the follow-up appointment.   Any Additional Special Instructions Will Be Listed Below (If Applicable).     If you need a refill on your cardiac medications before your next appointment, please call your pharmacy.

## 2017-07-23 NOTE — Progress Notes (Signed)
      Patient Care Team: Crist Infante, MD as PCP - General (Internal Medicine)   HPI  Judy Chapman is a 71 y.o. 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 It was initially implanted in 2002 with device generator replacement March 2010  by Dr. Doreatha Lew.     Echo 2006 demonstrated normal LV function  Her blood pressure which she recorded at home runs 120  range.   The patient denies chest pain, shortness of breath, nocturnal dyspnea, orthopnea or peripheral edema.  There have been no palpitations, lightheadedness or syncope.      Past Medical History:  Diagnosis Date  . ACE inhibitor intolerance   . Endometriosis   . Osteopenia   . Pacemaker-MDT    DOI-2002, Generator change 2010  . Pneumonia   . PVC (premature ventricular contraction)   . Sinus node dysfunction [facility])     Past Surgical History:  Procedure Laterality Date  . BREAST LUMPECTOMY     Benign  . CORONARY ANGIOPLASTY    . DILATION AND CURETTAGE OF UTERUS    . GALLBLADDER SURGERY    . HYSTEROSCOPY    . PACEMAKER INSERTION     Medtronic  . TUBAL LIGATION    . VAGINAL HYSTERECTOMY      Current Outpatient Prescriptions  Medication Sig Dispense Refill  . aspirin 81 MG tablet Take 81 mg by mouth every other day.     . Cholecalciferol (VITAMIN D PO) Take 1 tablet by mouth daily.     . citalopram (CELEXA) 20 MG tablet Take 1 tablet (20 mg total) by mouth daily. 90 tablet 2  . estradiol (ESTRACE) 0.5 MG tablet Take 1 tablet (0.5 mg total) by mouth daily. 90 tablet 4  . nebivolol (BYSTOLIC) 10 MG tablet Take 10 mg by mouth daily.     No current facility-administered medications for this visit.     Allergies  Allergen Reactions  . Augmentin [Amoxicillin-Pot Clavulanate]     diarrhea  . Ramipril Swelling    Review of Systems negative except from HPI and PMH  Physical Exam BP (!) 144/86   Pulse 84   Ht 5\' 4"  (1.626 m)   Wt 179 lb (81.2 kg)   SpO2 96%   BMI  30.73 kg/m  Well developed and nourished in no acute distress HENT normal Neck supple with JVP-flat Clear Regular rate and rhythm, no murmurs or gallops Abd-soft with active BS No Clubbing cyanosis edema Skin-warm and dry A & Oriented  Grossly normal sensory and motor function   ECG demonstrates atrial pacing 67 22/09/42 Low-voltage  Assessment and  Plan Hypertension      Sinus node dysfunction  Pacemaker-Medtronic  The patient's device was interrogated.  The information was reviewed. No changes were made in the programming.      BP well controlled  Good chronotropic response and pacing encourage more exercise with weight gain

## 2017-10-21 ENCOUNTER — Ambulatory Visit (INDEPENDENT_AMBULATORY_CARE_PROVIDER_SITE_OTHER): Payer: Medicare Other | Admitting: *Deleted

## 2017-10-21 DIAGNOSIS — I495 Sick sinus syndrome: Secondary | ICD-10-CM

## 2017-10-21 NOTE — Progress Notes (Signed)
Remote pacemaker transmission.   

## 2017-10-22 ENCOUNTER — Encounter: Payer: Self-pay | Admitting: Cardiology

## 2017-10-22 LAB — CUP PACEART REMOTE DEVICE CHECK
Battery Impedance: 592 Ohm
Battery Remaining Longevity: 83 mo
Battery Voltage: 2.8 V
Brady Statistic AP VS Percent: 84 %
Brady Statistic AS VP Percent: 1 %
Date Time Interrogation Session: 20181227141238
Implantable Lead Implant Date: 20020701
Implantable Lead Location: 753859
Implantable Lead Model: 4469
Implantable Lead Model: 4470
Implantable Lead Serial Number: 313853
Implantable Lead Serial Number: 323286
Implantable Pulse Generator Implant Date: 20100908
Lead Channel Impedance Value: 460 Ohm
Lead Channel Impedance Value: 564 Ohm
Lead Channel Pacing Threshold Pulse Width: 0.4 ms
Lead Channel Pacing Threshold Pulse Width: 0.4 ms
Lead Channel Setting Pacing Amplitude: 2.5 V
Lead Channel Setting Pacing Pulse Width: 0.4 ms
MDC IDC LEAD IMPLANT DT: 20020701
MDC IDC LEAD LOCATION: 753860
MDC IDC MSMT LEADCHNL RA PACING THRESHOLD AMPLITUDE: 0.625 V
MDC IDC MSMT LEADCHNL RV PACING THRESHOLD AMPLITUDE: 0.875 V
MDC IDC MSMT LEADCHNL RV SENSING INTR AMPL: 5.6 mV
MDC IDC SET LEADCHNL RA PACING AMPLITUDE: 2 V
MDC IDC SET LEADCHNL RV SENSING SENSITIVITY: 2 mV
MDC IDC STAT BRADY AP VP PERCENT: 1 %
MDC IDC STAT BRADY AS VS PERCENT: 15 %

## 2017-11-11 ENCOUNTER — Telehealth: Payer: Self-pay | Admitting: *Deleted

## 2017-11-11 NOTE — Telephone Encounter (Signed)
Estradiol 0.5 mg tablet approved through 10/25/2018, pharmacy informed as well.

## 2017-11-11 NOTE — Telephone Encounter (Signed)
Prior authoization done via cover my meds website for estradiol 0.5 mg tablets will wait for response.

## 2018-01-18 ENCOUNTER — Other Ambulatory Visit: Payer: Self-pay | Admitting: Women's Health

## 2018-01-18 DIAGNOSIS — Z1231 Encounter for screening mammogram for malignant neoplasm of breast: Secondary | ICD-10-CM

## 2018-01-20 ENCOUNTER — Encounter: Payer: Medicare Other | Admitting: *Deleted

## 2018-01-21 ENCOUNTER — Ambulatory Visit (INDEPENDENT_AMBULATORY_CARE_PROVIDER_SITE_OTHER): Payer: Medicare Other | Admitting: *Deleted

## 2018-01-21 ENCOUNTER — Encounter: Payer: Self-pay | Admitting: Cardiology

## 2018-01-21 DIAGNOSIS — I495 Sick sinus syndrome: Secondary | ICD-10-CM

## 2018-01-24 ENCOUNTER — Encounter: Payer: Self-pay | Admitting: Cardiology

## 2018-01-24 NOTE — Progress Notes (Signed)
Remote pacemaker transmission.   

## 2018-02-08 LAB — CUP PACEART REMOTE DEVICE CHECK
Battery Impedance: 666 Ohm
Battery Voltage: 2.79 V
Brady Statistic AP VP Percent: 1 %
Brady Statistic AP VS Percent: 84 %
Brady Statistic AS VS Percent: 15 %
Date Time Interrogation Session: 20190329204417
Implantable Lead Implant Date: 20020701
Implantable Lead Location: 753860
Implantable Lead Model: 4469
Implantable Lead Serial Number: 323286
Lead Channel Impedance Value: 460 Ohm
Lead Channel Impedance Value: 558 Ohm
Lead Channel Pacing Threshold Amplitude: 0.5 V
Lead Channel Pacing Threshold Amplitude: 0.75 V
Lead Channel Sensing Intrinsic Amplitude: 5.6 mV
Lead Channel Setting Pacing Amplitude: 2 V
Lead Channel Setting Pacing Amplitude: 2.5 V
Lead Channel Setting Pacing Pulse Width: 0.4 ms
MDC IDC LEAD IMPLANT DT: 20020701
MDC IDC LEAD LOCATION: 753859
MDC IDC LEAD SERIAL: 313853
MDC IDC MSMT BATTERY REMAINING LONGEVITY: 78 mo
MDC IDC MSMT LEADCHNL RA PACING THRESHOLD PULSEWIDTH: 0.4 ms
MDC IDC MSMT LEADCHNL RV PACING THRESHOLD PULSEWIDTH: 0.4 ms
MDC IDC PG IMPLANT DT: 20100908
MDC IDC SET LEADCHNL RV SENSING SENSITIVITY: 2.8 mV
MDC IDC STAT BRADY AS VP PERCENT: 0 %

## 2018-02-16 ENCOUNTER — Ambulatory Visit
Admission: RE | Admit: 2018-02-16 | Discharge: 2018-02-16 | Disposition: A | Discharge status: Home / Self Care | Payer: Medicare Other | Source: Ambulatory Visit | Attending: Women's Health | Admitting: Women's Health

## 2018-02-16 DIAGNOSIS — Z1231 Encounter for screening mammogram for malignant neoplasm of breast: Secondary | ICD-10-CM

## 2018-03-02 ENCOUNTER — Encounter (INDEPENDENT_AMBULATORY_CARE_PROVIDER_SITE_OTHER): Payer: Self-pay

## 2018-04-25 ENCOUNTER — Ambulatory Visit (INDEPENDENT_AMBULATORY_CARE_PROVIDER_SITE_OTHER): Payer: Medicare Other | Admitting: *Deleted

## 2018-04-25 DIAGNOSIS — I495 Sick sinus syndrome: Secondary | ICD-10-CM

## 2018-04-25 NOTE — Progress Notes (Signed)
Remote pacemaker transmission.   

## 2018-04-29 LAB — CUP PACEART REMOTE DEVICE CHECK
Battery Remaining Longevity: 76 mo
Battery Voltage: 2.78 V
Brady Statistic AP VS Percent: 82 %
Implantable Lead Implant Date: 20020701
Implantable Lead Serial Number: 313853
Lead Channel Pacing Threshold Amplitude: 0.625 V
Lead Channel Pacing Threshold Pulse Width: 0.4 ms
Lead Channel Sensing Intrinsic Amplitude: 5.6 mV
Lead Channel Setting Pacing Amplitude: 2 V
Lead Channel Setting Pacing Amplitude: 2.5 V
Lead Channel Setting Pacing Pulse Width: 0.4 ms
Lead Channel Setting Sensing Sensitivity: 2.8 mV
MDC IDC LEAD IMPLANT DT: 20020701
MDC IDC LEAD LOCATION: 753859
MDC IDC LEAD LOCATION: 753860
MDC IDC LEAD SERIAL: 323286
MDC IDC MSMT BATTERY IMPEDANCE: 717 Ohm
MDC IDC MSMT LEADCHNL RA IMPEDANCE VALUE: 466 Ohm
MDC IDC MSMT LEADCHNL RA PACING THRESHOLD AMPLITUDE: 0.75 V
MDC IDC MSMT LEADCHNL RA PACING THRESHOLD PULSEWIDTH: 0.4 ms
MDC IDC MSMT LEADCHNL RV IMPEDANCE VALUE: 588 Ohm
MDC IDC PG IMPLANT DT: 20100908
MDC IDC SESS DTM: 20190701164546
MDC IDC STAT BRADY AP VP PERCENT: 1 %
MDC IDC STAT BRADY AS VP PERCENT: 1 %
MDC IDC STAT BRADY AS VS PERCENT: 17 %

## 2018-07-25 ENCOUNTER — Ambulatory Visit (INDEPENDENT_AMBULATORY_CARE_PROVIDER_SITE_OTHER): Payer: Medicare Other | Admitting: *Deleted

## 2018-07-25 DIAGNOSIS — I495 Sick sinus syndrome: Secondary | ICD-10-CM

## 2018-07-26 ENCOUNTER — Encounter: Payer: Medicare Other | Admitting: Internal Medicine

## 2018-07-26 NOTE — Progress Notes (Signed)
Remote pacemaker transmission.   

## 2018-07-29 LAB — CUP PACEART REMOTE DEVICE CHECK
Battery Impedance: 742 Ohm
Battery Remaining Longevity: 74 mo
Battery Voltage: 2.79 V
Brady Statistic AP VP Percent: 1 %
Brady Statistic AP VS Percent: 82 %
Brady Statistic AS VP Percent: 0 %
Brady Statistic AS VS Percent: 17 %
Date Time Interrogation Session: 20190930175321
Implantable Lead Implant Date: 20020701
Implantable Lead Implant Date: 20020701
Implantable Lead Location: 753859
Implantable Lead Location: 753860
Implantable Lead Model: 4469
Implantable Lead Model: 4470
Implantable Lead Serial Number: 313853
Lead Channel Impedance Value: 460 Ohm
Lead Channel Impedance Value: 555 Ohm
Lead Channel Pacing Threshold Amplitude: 0.625 V
Lead Channel Sensing Intrinsic Amplitude: 5.6 mV
Lead Channel Setting Pacing Amplitude: 2.5 V
Lead Channel Setting Pacing Pulse Width: 0.4 ms
Lead Channel Setting Sensing Sensitivity: 2.8 mV
MDC IDC LEAD SERIAL: 323286
MDC IDC MSMT LEADCHNL RA PACING THRESHOLD PULSEWIDTH: 0.4 ms
MDC IDC MSMT LEADCHNL RV PACING THRESHOLD AMPLITUDE: 0.625 V
MDC IDC MSMT LEADCHNL RV PACING THRESHOLD PULSEWIDTH: 0.4 ms
MDC IDC PG IMPLANT DT: 20100908
MDC IDC SET LEADCHNL RA PACING AMPLITUDE: 2 V

## 2018-08-02 ENCOUNTER — Encounter: Payer: Medicare Other | Admitting: Internal Medicine

## 2018-09-12 ENCOUNTER — Encounter: Payer: Self-pay | Admitting: Internal Medicine

## 2018-09-12 ENCOUNTER — Ambulatory Visit (INDEPENDENT_AMBULATORY_CARE_PROVIDER_SITE_OTHER): Payer: Medicare Other | Admitting: Internal Medicine

## 2018-09-12 VITALS — BP 120/80 | HR 72 | Ht 64.0 in | Wt 174.4 lb

## 2018-09-12 DIAGNOSIS — I495 Sick sinus syndrome: Secondary | ICD-10-CM | POA: Diagnosis not present

## 2018-09-12 DIAGNOSIS — Z950 Presence of cardiac pacemaker: Secondary | ICD-10-CM | POA: Diagnosis not present

## 2018-09-12 DIAGNOSIS — I10 Essential (primary) hypertension: Secondary | ICD-10-CM

## 2018-09-12 NOTE — Progress Notes (Signed)
      Patient Care Team: Crist Infante, MD as PCP - General (Internal Medicine)   HPI  Judy Chapman is a 72 y.o. 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 It was initially implanted in 2002 with device generator replacement March 2010  by Dr. Doreatha Lew.     Echo 2006 demonstrated normal LV function   The patient denies chest pain, shortness of breath, nocturnal dyspnea, orthopnea or peripheral edema.  There have been no palpitations, lightheadedness or syncope.        Past Medical History:  Diagnosis Date  . ACE inhibitor intolerance   . Endometriosis   . Osteopenia   . Pacemaker-MDT    DOI-2002, Generator change 2010  . Pneumonia   . PVC (premature ventricular contraction)   . Sinus node dysfunction [facility])     Past Surgical History:  Procedure Laterality Date  . BREAST LUMPECTOMY     Benign  . CORONARY ANGIOPLASTY    . DILATION AND CURETTAGE OF UTERUS    . GALLBLADDER SURGERY    . HYSTEROSCOPY    . PACEMAKER INSERTION     Medtronic  . TUBAL LIGATION    . VAGINAL HYSTERECTOMY      Current Outpatient Medications  Medication Sig Dispense Refill  . Cholecalciferol (VITAMIN D PO) Take 1 tablet by mouth daily.     . citalopram (CELEXA) 20 MG tablet Take 1 tablet (20 mg total) by mouth daily. 90 tablet 2  . estradiol (ESTRACE) 0.5 MG tablet Take 1 tablet (0.5 mg total) by mouth daily. 90 tablet 4  . nebivolol (BYSTOLIC) 10 MG tablet Take 10 mg by mouth daily.     No current facility-administered medications for this visit.     Allergies  Allergen Reactions  . Augmentin [Amoxicillin-Pot Clavulanate]     diarrhea  . Ramipril Swelling    Review of Systems negative except from HPI and PMH  Physical Exam BP 120/80   Pulse 72   Ht 5\' 4"  (1.626 m)   Wt 174 lb 6.4 oz (79.1 kg)   SpO2 98%   BMI 29.94 kg/m  Well developed and nourished in no acute distress HENT normal Neck supple with JVP-flat Clear Regular rate and  rhythm, no murmurs or gallops Abd-soft with active BS No Clubbing cyanosis edema Skin-warm and dry A & Oriented  Grossly normal sensory and motor function    ECG atrial pacing at 72 Intervals 22/09/39 Low voltage Personally reviewed    Assessment and  Plan Hypertension      Sinus node dysfunction  Pacemaker-Medtronic  The patient's device was interrogated.  The information was reviewed. No changes were made in the programming.    Blood pressure well controlled  Good heart rate excursion

## 2018-09-12 NOTE — Patient Instructions (Signed)
Your physician recommends that you continue on your current medications as directed. Please refer to the Current Medication list given to you today.   Your physician wants you to follow-up in: YEAR WITH DR KLEIN  You will receive a reminder letter in the mail two months in advance. If you don't receive a letter, please call our office to schedule the follow-up appointment.  

## 2018-09-13 LAB — CUP PACEART INCLINIC DEVICE CHECK
Brady Statistic AP VP Percent: 1 %
Brady Statistic AP VS Percent: 83 %
Brady Statistic AS VP Percent: 0 %
Brady Statistic AS VS Percent: 16 %
Implantable Lead Implant Date: 20020701
Implantable Lead Implant Date: 20020701
Implantable Lead Location: 753860
Implantable Lead Model: 4470
Implantable Lead Serial Number: 323286
Implantable Pulse Generator Implant Date: 20100908
Lead Channel Impedance Value: 562 Ohm
Lead Channel Pacing Threshold Amplitude: 0.5 V
Lead Channel Pacing Threshold Amplitude: 0.625 V
Lead Channel Pacing Threshold Pulse Width: 0.4 ms
Lead Channel Pacing Threshold Pulse Width: 0.4 ms
Lead Channel Sensing Intrinsic Amplitude: 2 mV
Lead Channel Sensing Intrinsic Amplitude: 4 mV
Lead Channel Setting Pacing Amplitude: 2 V
Lead Channel Setting Pacing Amplitude: 2.5 V
Lead Channel Setting Sensing Sensitivity: 2 mV
MDC IDC LEAD LOCATION: 753859
MDC IDC LEAD SERIAL: 313853
MDC IDC MSMT BATTERY IMPEDANCE: 766 Ohm
MDC IDC MSMT BATTERY REMAINING LONGEVITY: 73 mo
MDC IDC MSMT BATTERY VOLTAGE: 2.78 V
MDC IDC MSMT LEADCHNL RA IMPEDANCE VALUE: 460 Ohm
MDC IDC SESS DTM: 20191118172922
MDC IDC SET LEADCHNL RV PACING PULSEWIDTH: 0.4 ms

## 2018-10-24 ENCOUNTER — Telehealth: Payer: Self-pay

## 2018-10-24 ENCOUNTER — Ambulatory Visit (INDEPENDENT_AMBULATORY_CARE_PROVIDER_SITE_OTHER): Payer: Medicare Other

## 2018-10-24 DIAGNOSIS — I495 Sick sinus syndrome: Secondary | ICD-10-CM | POA: Diagnosis not present

## 2018-10-24 LAB — CUP PACEART REMOTE DEVICE CHECK
Battery Voltage: 2.78 V
Brady Statistic AP VP Percent: 1 %
Brady Statistic AP VS Percent: 79 %
Brady Statistic AS VP Percent: 0 %
Brady Statistic AS VS Percent: 19 %
Date Time Interrogation Session: 20191230210843
Implantable Lead Implant Date: 20020701
Implantable Lead Location: 753860
Implantable Lead Model: 4470
Implantable Lead Serial Number: 313853
Implantable Lead Serial Number: 323286
Lead Channel Impedance Value: 447 Ohm
Lead Channel Impedance Value: 527 Ohm
Lead Channel Pacing Threshold Amplitude: 0.625 V
Lead Channel Pacing Threshold Amplitude: 0.75 V
Lead Channel Pacing Threshold Pulse Width: 0.4 ms
Lead Channel Pacing Threshold Pulse Width: 0.4 ms
Lead Channel Setting Pacing Pulse Width: 0.4 ms
Lead Channel Setting Sensing Sensitivity: 2.8 mV
MDC IDC LEAD IMPLANT DT: 20020701
MDC IDC LEAD LOCATION: 753859
MDC IDC MSMT BATTERY IMPEDANCE: 793 Ohm
MDC IDC MSMT BATTERY REMAINING LONGEVITY: 72 mo
MDC IDC PG IMPLANT DT: 20100908
MDC IDC SET LEADCHNL RA PACING AMPLITUDE: 2 V
MDC IDC SET LEADCHNL RV PACING AMPLITUDE: 2.5 V

## 2018-10-24 NOTE — Telephone Encounter (Signed)
Spoke with patient to remind of missed remote transmission 

## 2018-10-25 NOTE — Progress Notes (Signed)
Remote pacemaker transmission.   

## 2019-01-23 ENCOUNTER — Ambulatory Visit (INDEPENDENT_AMBULATORY_CARE_PROVIDER_SITE_OTHER): Payer: Medicare Other | Admitting: *Deleted

## 2019-01-23 ENCOUNTER — Other Ambulatory Visit: Payer: Self-pay

## 2019-01-23 DIAGNOSIS — I495 Sick sinus syndrome: Secondary | ICD-10-CM | POA: Diagnosis not present

## 2019-01-23 DIAGNOSIS — I10 Essential (primary) hypertension: Secondary | ICD-10-CM

## 2019-01-24 LAB — CUP PACEART REMOTE DEVICE CHECK
Battery Voltage: 2.78 V
Brady Statistic AP VP Percent: 1 %
Brady Statistic AP VS Percent: 78 %
Brady Statistic AS VP Percent: 0 %
Brady Statistic AS VS Percent: 21 %
Date Time Interrogation Session: 20200330182839
Implantable Lead Implant Date: 20020701
Implantable Lead Location: 753859
Implantable Lead Location: 753860
Implantable Lead Model: 4470
Implantable Lead Serial Number: 323286
Lead Channel Impedance Value: 454 Ohm
Lead Channel Pacing Threshold Amplitude: 0.625 V
Lead Channel Pacing Threshold Amplitude: 0.75 V
Lead Channel Pacing Threshold Pulse Width: 0.4 ms
Lead Channel Pacing Threshold Pulse Width: 0.4 ms
MDC IDC LEAD IMPLANT DT: 20020701
MDC IDC LEAD SERIAL: 313853
MDC IDC MSMT BATTERY IMPEDANCE: 868 Ohm
MDC IDC MSMT BATTERY REMAINING LONGEVITY: 69 mo
MDC IDC MSMT LEADCHNL RV IMPEDANCE VALUE: 534 Ohm
MDC IDC PG IMPLANT DT: 20100908
MDC IDC SET LEADCHNL RA PACING AMPLITUDE: 2 V
MDC IDC SET LEADCHNL RV PACING AMPLITUDE: 2.5 V
MDC IDC SET LEADCHNL RV PACING PULSEWIDTH: 0.4 ms
MDC IDC SET LEADCHNL RV SENSING SENSITIVITY: 2 mV

## 2019-02-02 NOTE — Progress Notes (Signed)
Remote pacemaker transmission.   

## 2019-04-21 ENCOUNTER — Other Ambulatory Visit: Payer: Self-pay | Admitting: Internal Medicine

## 2019-04-21 DIAGNOSIS — E785 Hyperlipidemia, unspecified: Secondary | ICD-10-CM

## 2019-04-24 ENCOUNTER — Ambulatory Visit (INDEPENDENT_AMBULATORY_CARE_PROVIDER_SITE_OTHER): Payer: Medicare Other | Admitting: *Deleted

## 2019-04-24 DIAGNOSIS — I495 Sick sinus syndrome: Secondary | ICD-10-CM | POA: Diagnosis not present

## 2019-04-25 ENCOUNTER — Telehealth: Payer: Self-pay

## 2019-04-25 LAB — CUP PACEART REMOTE DEVICE CHECK
Battery Impedance: 921 Ohm
Battery Remaining Longevity: 66 mo
Battery Voltage: 2.78 V
Brady Statistic AP VP Percent: 1 %
Brady Statistic AP VS Percent: 79 %
Brady Statistic AS VP Percent: 0 %
Brady Statistic AS VS Percent: 20 %
Date Time Interrogation Session: 20200630165317
Implantable Lead Implant Date: 20020701
Implantable Lead Implant Date: 20020701
Implantable Lead Location: 753859
Implantable Lead Location: 753860
Implantable Lead Model: 4469
Implantable Lead Model: 4470
Implantable Lead Serial Number: 313853
Implantable Lead Serial Number: 323286
Implantable Pulse Generator Implant Date: 20100908
Lead Channel Impedance Value: 473 Ohm
Lead Channel Impedance Value: 555 Ohm
Lead Channel Pacing Threshold Amplitude: 0.625 V
Lead Channel Pacing Threshold Amplitude: 0.75 V
Lead Channel Pacing Threshold Pulse Width: 0.4 ms
Lead Channel Pacing Threshold Pulse Width: 0.4 ms
Lead Channel Setting Pacing Amplitude: 2 V
Lead Channel Setting Pacing Amplitude: 2.5 V
Lead Channel Setting Pacing Pulse Width: 0.4 ms
Lead Channel Setting Sensing Sensitivity: 2 mV

## 2019-04-25 NOTE — Telephone Encounter (Signed)
Left message for patient to remind of missed remote transmission.  

## 2019-05-05 ENCOUNTER — Ambulatory Visit
Admission: RE | Admit: 2019-05-05 | Discharge: 2019-05-05 | Disposition: A | Discharge status: Home / Self Care | Payer: Medicare Other | Source: Ambulatory Visit | Attending: Internal Medicine | Admitting: Internal Medicine

## 2019-05-05 DIAGNOSIS — E785 Hyperlipidemia, unspecified: Secondary | ICD-10-CM

## 2019-05-06 ENCOUNTER — Encounter: Payer: Self-pay | Admitting: Cardiology

## 2019-05-06 NOTE — Progress Notes (Signed)
Remote pacemaker transmission.   

## 2019-07-24 ENCOUNTER — Ambulatory Visit (INDEPENDENT_AMBULATORY_CARE_PROVIDER_SITE_OTHER): Payer: Medicare Other | Admitting: *Deleted

## 2019-07-24 DIAGNOSIS — I495 Sick sinus syndrome: Secondary | ICD-10-CM | POA: Diagnosis not present

## 2019-07-25 LAB — CUP PACEART REMOTE DEVICE CHECK
Battery Impedance: 998 Ohm
Battery Remaining Longevity: 63 mo
Battery Voltage: 2.79 V
Brady Statistic AP VP Percent: 1 %
Brady Statistic AP VS Percent: 79 %
Brady Statistic AS VP Percent: 0 %
Brady Statistic AS VS Percent: 20 %
Date Time Interrogation Session: 20200928145936
Implantable Lead Implant Date: 20020701
Implantable Lead Implant Date: 20020701
Implantable Lead Location: 753859
Implantable Lead Location: 753860
Implantable Lead Model: 4469
Implantable Lead Model: 4470
Implantable Lead Serial Number: 313853
Implantable Lead Serial Number: 323286
Implantable Pulse Generator Implant Date: 20100908
Lead Channel Impedance Value: 480 Ohm
Lead Channel Impedance Value: 586 Ohm
Lead Channel Pacing Threshold Amplitude: 0.625 V
Lead Channel Pacing Threshold Amplitude: 0.75 V
Lead Channel Pacing Threshold Pulse Width: 0.4 ms
Lead Channel Pacing Threshold Pulse Width: 0.4 ms
Lead Channel Setting Pacing Amplitude: 2 V
Lead Channel Setting Pacing Amplitude: 2.5 V
Lead Channel Setting Pacing Pulse Width: 0.4 ms
Lead Channel Setting Sensing Sensitivity: 2.8 mV

## 2019-08-02 NOTE — Progress Notes (Signed)
Remote pacemaker transmission.   

## 2019-10-23 ENCOUNTER — Ambulatory Visit (INDEPENDENT_AMBULATORY_CARE_PROVIDER_SITE_OTHER): Payer: Medicare Other | Admitting: *Deleted

## 2019-10-23 DIAGNOSIS — Z950 Presence of cardiac pacemaker: Secondary | ICD-10-CM | POA: Diagnosis not present

## 2019-10-24 LAB — CUP PACEART REMOTE DEVICE CHECK
Battery Impedance: 998 Ohm
Battery Remaining Longevity: 63 mo
Battery Voltage: 2.79 V
Brady Statistic AP VP Percent: 1 %
Brady Statistic AP VS Percent: 79 %
Brady Statistic AS VP Percent: 0 %
Brady Statistic AS VS Percent: 20 %
Date Time Interrogation Session: 20201229100722
Implantable Lead Implant Date: 20020701
Implantable Lead Implant Date: 20020701
Implantable Lead Location: 753859
Implantable Lead Location: 753860
Implantable Lead Model: 4469
Implantable Lead Model: 4470
Implantable Lead Serial Number: 313853
Implantable Lead Serial Number: 323286
Implantable Pulse Generator Implant Date: 20100908
Lead Channel Impedance Value: 454 Ohm
Lead Channel Impedance Value: 586 Ohm
Lead Channel Pacing Threshold Amplitude: 0.625 V
Lead Channel Pacing Threshold Amplitude: 0.625 V
Lead Channel Pacing Threshold Pulse Width: 0.4 ms
Lead Channel Pacing Threshold Pulse Width: 0.4 ms
Lead Channel Setting Pacing Amplitude: 2 V
Lead Channel Setting Pacing Amplitude: 2.5 V
Lead Channel Setting Pacing Pulse Width: 0.4 ms
Lead Channel Setting Sensing Sensitivity: 2 mV

## 2019-10-27 HISTORY — PX: BREAST LUMPECTOMY: SHX2

## 2019-12-13 ENCOUNTER — Encounter: Payer: Self-pay | Admitting: Internal Medicine

## 2019-12-13 ENCOUNTER — Other Ambulatory Visit: Payer: Self-pay

## 2019-12-13 ENCOUNTER — Ambulatory Visit: Payer: Medicare PPO | Admitting: Internal Medicine

## 2019-12-13 VITALS — BP 118/68 | HR 85 | Ht 64.0 in | Wt 178.2 lb

## 2019-12-13 DIAGNOSIS — I495 Sick sinus syndrome: Secondary | ICD-10-CM | POA: Diagnosis not present

## 2019-12-13 DIAGNOSIS — Z950 Presence of cardiac pacemaker: Secondary | ICD-10-CM | POA: Diagnosis not present

## 2019-12-13 NOTE — Patient Instructions (Signed)

## 2019-12-13 NOTE — Progress Notes (Signed)
      Patient Care Team: Crist Infante, MD as PCP - General (Internal Medicine)   HPI  Judy Chapman is a 74 y.o. 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 It was initially implanted in 2002 with device generator replacement March 2010  by Dr. Doreatha Lew.   The patient denies chest pain, shortness of breath, nocturnal dyspnea, orthopnea or peripheral edema.  There have been no palpitations, lightheadedness or syncope.  But she is not very active     Echo 2006 demonstrated normal LV function  CT Ca score 0   Date Cr K Hgb  6/20 0.7   13.5            Past Medical History:  Diagnosis Date  . ACE inhibitor intolerance   . Endometriosis   . Osteopenia   . Pacemaker-MDT    DOI-2002, Generator change 2010  . Pneumonia   . PVC (premature ventricular contraction)   . Sinus node dysfunction [facility])     Past Surgical History:  Procedure Laterality Date  . BREAST LUMPECTOMY     Benign  . CORONARY ANGIOPLASTY    . DILATION AND CURETTAGE OF UTERUS    . GALLBLADDER SURGERY    . HYSTEROSCOPY    . PACEMAKER INSERTION     Medtronic  . TUBAL LIGATION    . VAGINAL HYSTERECTOMY      Current Outpatient Medications  Medication Sig Dispense Refill  . allopurinol (ZYLOPRIM) 100 MG tablet Take 200 mg by mouth daily.    . Cholecalciferol (VITAMIN D PO) Take 1 tablet by mouth daily.     . citalopram (CELEXA) 20 MG tablet Take 1 tablet (20 mg total) by mouth daily. 90 tablet 2  . estradiol (ESTRACE) 0.5 MG tablet Take 1 tablet (0.5 mg total) by mouth daily. 90 tablet 4  . nebivolol (BYSTOLIC) 10 MG tablet Take 10 mg by mouth daily.     No current facility-administered medications for this visit.    Allergies  Allergen Reactions  . Augmentin [Amoxicillin-Pot Clavulanate]     diarrhea  . Ramipril Swelling    Review of Systems negative except from HPI and PMH  Physical Exam BP 118/68   Pulse 85   Ht 5\' 4"  (1.626 m)   Wt 178 lb 3.2 oz  (80.8 kg)   SpO2 97%   BMI 30.59 kg/m  Well developed and well nourished in no acute distress HENT normal Neck supple with JVP-flat Clear Device pocket well healed; without hematoma or erythema.  There is no tethering  Regular rate and rhythm, no  murmur Abd-soft with active BS No Clubbing cyanosis   edema Skin-warm and dry A & Oriented  Grossly normal sensory and motor function  ECG  A pacing @ 85 22/09/38    Assessment and  Plan Hypertension      Sinus node dysfunction  Pacemaker-Medtronic        BP well controlled  Chronotropic incompetence  >> reprogramming of the rate response  ADL slope 3>>2 and Exercise sloepe 3>>4

## 2019-12-25 LAB — CUP PACEART INCLINIC DEVICE CHECK
Battery Impedance: 1025 Ohm
Battery Remaining Longevity: 62 mo
Battery Voltage: 2.78 V
Brady Statistic AP VP Percent: 1 %
Brady Statistic AP VS Percent: 80 %
Brady Statistic AS VP Percent: 0 %
Brady Statistic AS VS Percent: 20 %
Date Time Interrogation Session: 20210217165400
Implantable Lead Implant Date: 20020701
Implantable Lead Implant Date: 20020701
Implantable Lead Location: 753859
Implantable Lead Location: 753860
Implantable Lead Model: 4469
Implantable Lead Model: 4470
Implantable Lead Serial Number: 313853
Implantable Lead Serial Number: 323286
Implantable Pulse Generator Implant Date: 20100908
Lead Channel Impedance Value: 473 Ohm
Lead Channel Impedance Value: 559 Ohm
Lead Channel Pacing Threshold Amplitude: 0.625 V
Lead Channel Pacing Threshold Amplitude: 0.625 V
Lead Channel Pacing Threshold Pulse Width: 0.4 ms
Lead Channel Pacing Threshold Pulse Width: 0.4 ms
Lead Channel Setting Pacing Amplitude: 2 V
Lead Channel Setting Pacing Amplitude: 2.5 V
Lead Channel Setting Pacing Pulse Width: 0.4 ms
Lead Channel Setting Sensing Sensitivity: 2 mV

## 2020-01-22 ENCOUNTER — Ambulatory Visit (INDEPENDENT_AMBULATORY_CARE_PROVIDER_SITE_OTHER): Payer: Medicare PPO | Admitting: *Deleted

## 2020-01-22 DIAGNOSIS — Z950 Presence of cardiac pacemaker: Secondary | ICD-10-CM

## 2020-01-22 LAB — CUP PACEART REMOTE DEVICE CHECK
Battery Impedance: 1157 Ohm
Battery Remaining Longevity: 58 mo
Battery Voltage: 2.77 V
Brady Statistic AP VP Percent: 0 %
Brady Statistic AP VS Percent: 81 %
Brady Statistic AS VP Percent: 0 %
Brady Statistic AS VS Percent: 18 %
Date Time Interrogation Session: 20210329093754
Implantable Lead Implant Date: 20020701
Implantable Lead Implant Date: 20020701
Implantable Lead Location: 753859
Implantable Lead Location: 753860
Implantable Lead Model: 4469
Implantable Lead Model: 4470
Implantable Lead Serial Number: 313853
Implantable Lead Serial Number: 323286
Implantable Pulse Generator Implant Date: 20100908
Lead Channel Impedance Value: 460 Ohm
Lead Channel Impedance Value: 552 Ohm
Lead Channel Pacing Threshold Amplitude: 0.625 V
Lead Channel Pacing Threshold Amplitude: 0.625 V
Lead Channel Pacing Threshold Pulse Width: 0.4 ms
Lead Channel Pacing Threshold Pulse Width: 0.4 ms
Lead Channel Setting Pacing Amplitude: 2 V
Lead Channel Setting Pacing Amplitude: 2.5 V
Lead Channel Setting Pacing Pulse Width: 0.4 ms
Lead Channel Setting Sensing Sensitivity: 2 mV

## 2020-01-22 NOTE — Progress Notes (Signed)
PPM Remote  

## 2020-02-02 ENCOUNTER — Other Ambulatory Visit: Payer: Self-pay | Admitting: Internal Medicine

## 2020-02-02 DIAGNOSIS — Z1231 Encounter for screening mammogram for malignant neoplasm of breast: Secondary | ICD-10-CM

## 2020-02-29 ENCOUNTER — Other Ambulatory Visit: Payer: Self-pay

## 2020-02-29 ENCOUNTER — Ambulatory Visit
Admission: RE | Admit: 2020-02-29 | Discharge: 2020-02-29 | Disposition: A | Discharge status: Home / Self Care | Payer: Medicare PPO | Source: Ambulatory Visit | Attending: Internal Medicine | Admitting: Internal Medicine

## 2020-02-29 DIAGNOSIS — Z1231 Encounter for screening mammogram for malignant neoplasm of breast: Secondary | ICD-10-CM

## 2020-03-01 ENCOUNTER — Other Ambulatory Visit: Payer: Self-pay | Admitting: Internal Medicine

## 2020-03-01 DIAGNOSIS — R928 Other abnormal and inconclusive findings on diagnostic imaging of breast: Secondary | ICD-10-CM

## 2020-03-08 ENCOUNTER — Other Ambulatory Visit: Payer: Self-pay

## 2020-03-08 ENCOUNTER — Other Ambulatory Visit: Payer: Self-pay | Admitting: Internal Medicine

## 2020-03-08 ENCOUNTER — Ambulatory Visit
Admission: RE | Admit: 2020-03-08 | Discharge: 2020-03-08 | Disposition: A | Discharge status: Home / Self Care | Payer: Medicare PPO | Source: Ambulatory Visit | Attending: Internal Medicine | Admitting: Internal Medicine

## 2020-03-08 DIAGNOSIS — N6310 Unspecified lump in the right breast, unspecified quadrant: Secondary | ICD-10-CM

## 2020-03-08 DIAGNOSIS — R928 Other abnormal and inconclusive findings on diagnostic imaging of breast: Secondary | ICD-10-CM

## 2020-03-11 ENCOUNTER — Ambulatory Visit
Admission: RE | Admit: 2020-03-11 | Discharge: 2020-03-11 | Disposition: A | Discharge status: Home / Self Care | Payer: Medicare PPO | Source: Ambulatory Visit | Attending: Internal Medicine | Admitting: Internal Medicine

## 2020-03-11 ENCOUNTER — Other Ambulatory Visit: Payer: Self-pay

## 2020-03-11 DIAGNOSIS — N6310 Unspecified lump in the right breast, unspecified quadrant: Secondary | ICD-10-CM

## 2020-03-13 ENCOUNTER — Telehealth: Payer: Self-pay | Admitting: Oncology

## 2020-03-13 NOTE — Telephone Encounter (Signed)
Spoke to patient to confirm morning [facility] appointment for 5/26, packet emailed to patient

## 2020-03-14 ENCOUNTER — Encounter: Payer: Self-pay | Admitting: *Deleted

## 2020-03-14 DIAGNOSIS — Z170 Estrogen receptor positive status [ER+]: Secondary | ICD-10-CM

## 2020-03-14 DIAGNOSIS — C50211 Malignant neoplasm of upper-inner quadrant of right female breast: Secondary | ICD-10-CM | POA: Insufficient documentation

## 2020-03-19 NOTE — Progress Notes (Signed)
Judy Chapman  Telephone:([phone]) [phone] Fax:(336) [phone]     ID: KINZLY PIERRELOUIS DOB: Feb 23, 1946  MR#: [id number]  AGT#:[id number]  Patient Care Team: Crist Infante, MD as PCP - General (Internal Medicine) Rockwell Germany, RN as Oncology Nurse Navigator Mauro Kaufmann, RN as Oncology Nurse Navigator Jovita Kussmaul, MD as Consulting Physician (General Surgery) Harrietta Incorvaia, Virgie Dad, MD as Consulting Physician (Oncology) Eppie Gibson, MD as Attending Physician (Radiation Oncology) Deboraha Sprang, MD as Consulting Physician (Cardiology) Chauncey Cruel, MD OTHER MD:  CHIEF COMPLAINT: estrogen receptor positive breast cancer  CURRENT TREATMENT: Awaiting definitive surgery   HISTORY OF CURRENT ILLNESS: Judy Chapman had routine screening mammography on 02/29/2020 showing a possible abnormality in the right breast. She underwent right diagnostic mammography with tomography and right breast ultrasonography at [facility] on 03/08/2020 showing: breast density category B; suspicious 8 mm mass in right breast at 1 o'clock; right axilla unremarkable.  Accordingly on 03/11/2020 she proceeded to biopsy of the right breast area in question. The pathology from this procedure ([id number]) showed: invasive and in situ mammary carcinoma, grade 2, e-cadherin negative. Prognostic indicators significant for: estrogen receptor, 90% positive and progesterone receptor, 95% positive, both with strong staining intensity. Proliferation marker Ki67 at 10%. HER2 negative by immunohistochemistry (1+).  The patient's subsequent history is as detailed below.   INTERVAL HISTORY: Latanga was evaluated in the multidisciplinary breast cancer clinic on 03/20/2020 accompanied by her husband Delfino Lovett. Her case was also presented at the multidisciplinary breast cancer conference on the same day. At that time a preliminary plan was proposed: Breast conserving surgery with no sentinel lymph node sampling,  antiestrogens, no adjuvant radiation   REVIEW OF SYSTEMS: There were no specific symptoms leading to the original mammogram, which was routinely scheduled. On the provided questionnaire, Alessia reports wearing glasses, joint pain, and arthritis. The patient denies unusual headaches, visual changes, nausea, vomiting, stiff neck, dizziness, or gait imbalance. There has been no cough, phlegm production, or pleurisy, no chest pain or pressure, and no change in bowel or bladder habits. The patient denies fever, rash, bleeding, unexplained fatigue or unexplained weight loss. A detailed review of systems was otherwise entirely negative.   PAST MEDICAL HISTORY: Past Medical History:  Diagnosis Date   ACE inhibitor intolerance    Endometriosis    Heart disease    Hypertension    Osteopenia    Pacemaker-MDT    DOI-2002, Generator change 2010   Pneumonia    PVC (premature ventricular contraction)    Sinus node dysfunction ([place])     PAST SURGICAL HISTORY: Past Surgical History:  Procedure Laterality Date   BREAST LUMPECTOMY     Benign   CORONARY ANGIOPLASTY     DILATION AND CURETTAGE OF UTERUS     GALLBLADDER SURGERY     HYSTEROSCOPY     PACEMAKER INSERTION     Medtronic   TUBAL LIGATION     VAGINAL HYSTERECTOMY      FAMILY HISTORY: Family History  Problem Relation Age of Onset   Stomach cancer Paternal Grandfather 16   Hypertension Mother    Arrhythmia Neg Hx    Clotting disorder Neg Hx    Heart attack Neg Hx    Fainting Neg Hx    Anemia Neg Hx    Heart disease Neg Hx    Heart failure Neg Hx    Hyperlipidemia Neg Hx    Colon cancer Neg Hx    Her father died at  age 55 from cirrhosis of the liver. Her mother died at age 57 from pneumonia/sepsis. Judy Chapman has 1 brother. She reports stomach cancer in her paternal grandfather.  There is no other history of breast ovarian or prostate cancer in the family.   GYNECOLOGIC HISTORY:  No LMP recorded. Patient  has had a hysterectomy. Menarche: 74 years old Age at first live birth: 74 years old [place] P 2 LMP prior to hysterectomy HRT used estradiol pill until breast cancer diagnosis (mid May 2021) Hysterectomy? Yes, in her early 87's BSO? no   SOCIAL HISTORY: (updated 02/2020)  Talah is currently retired from working as a [employer]. Husband Delfino Lovett is an Holiday representative and works in Paediatric nurse. She lives at home with her husband. Daughter Charyl Bigger, age 35, works as an Metallurgist here in [place]. Daughter Andris Baumann, age 54, is a Doctor, hospital with [employer] (an investment firm) in [place], [place]. Corah has 4 grandchildren. She is a Tourist information centre manager.    ADVANCED DIRECTIVES: In the absence of any documentation to the contrary, the patient's spouse is their HCPOA.    HEALTH MAINTENANCE: Social History   Tobacco Use   Smoking status: Former Smoker    Packs/day: 0.30    Years: 10.00    Pack years: 3.00    Types: Cigarettes    Quit date: 10/26/2005    Years since quitting: 14.4   Smokeless tobacco: Never Used  Substance Use Topics   Alcohol use: Yes    Alcohol/week: 10.0 standard drinks    Types: 10 Glasses of wine per week   Drug use: No     Colonoscopy: 10/2012 (Dr. Deatra Ina)  PAP: 2015  Bone density: 2019   Allergies  Allergen Reactions   Augmentin [Amoxicillin-Pot Clavulanate]     diarrhea   Macrodantin [Nitrofurantoin] Other (See Comments)    Sweating and chills   Ramipril Hives and Swelling         Current Outpatient Medications  Medication Sig Dispense Refill   allopurinol (ZYLOPRIM) 100 MG tablet Take 200 mg by mouth daily.     Cholecalciferol (VITAMIN D PO) Take 1 tablet by mouth daily.      citalopram (CELEXA) 20 MG tablet Take 1 tablet (20 mg total) by mouth daily. 90 tablet 2   estradiol (ESTRACE) 0.5 MG tablet Take 1 tablet (0.5 mg total) by mouth daily. 90 tablet 4   nebivolol (BYSTOLIC) 10 MG tablet Take 10 mg by mouth daily.      No current facility-administered medications for this visit.    OBJECTIVE: White woman in no acute distress  Vitals:   03/20/20 0851  BP: (!) 152/60  Pulse: 84  Resp: 18  Temp: 98.7 F (37.1 C)  SpO2: 97%     Body mass index is 29.9 kg/m.   Wt Readings from Last 3 Encounters:  03/20/20 176 lb 14.4 oz (80.2 kg)  12/13/19 178 lb 3.2 oz (80.8 kg)  09/12/18 174 lb 6.4 oz (79.1 kg)      ECOG FS:1 - Symptomatic but completely ambulatory  Ocular: Sclerae unicteric, pupils round and equal Ear-nose-throat: Wearing a mask Lymphatic: No cervical or supraclavicular adenopathy Lungs no rales or rhonchi Heart regular rate and rhythm Abd soft, nontender, positive bowel sounds MSK no focal spinal tenderness, no joint edema Neuro: non-focal, well-oriented, appropriate affect Breasts: The right breast is status post recent biopsy.  There is no significant ecchymosis.  There is no palpable mass.  The left breast is benign.  Both axillae are benign.   LAB RESULTS:  CMP     Component Value Date/Time   NA 143 03/20/2020 0827   K 4.0 03/20/2020 0827   CL 108 03/20/2020 0827   CO2 27 03/20/2020 0827   GLUCOSE 94 03/20/2020 0827   BUN 22 03/20/2020 0827   CREATININE 0.76 03/20/2020 0827   CALCIUM 8.4 (L) 03/20/2020 0827   PROT 6.6 03/20/2020 0827   ALBUMIN 3.6 03/20/2020 0827   AST 20 03/20/2020 0827   ALT 20 03/20/2020 0827   ALKPHOS 53 03/20/2020 0827   BILITOT 0.6 03/20/2020 0827   GFRNONAA >60 03/20/2020 0827   GFRAA >60 03/20/2020 0827    No results found for: TOTALPROTELP, ALBUMINELP, A1GS, A2GS, BETS, BETA2SER, GAMS, MSPIKE, SPEI  Lab Results  Component Value Date   WBC 5.0 03/20/2020   NEUTROABS 2.8 03/20/2020   HGB 13.6 03/20/2020   HCT 40.8 03/20/2020   MCV 97.4 03/20/2020   PLT 204 03/20/2020    No results found for: LABCA2  No components found for: ASNKNL976  No results for input(s): INR in the last 168 hours.  No results found for: LABCA2  No  results found for: BHA193  No results found for: XTK240  No results found for: XBD532  No results found for: CA2729  No components found for: HGQUANT  No results found for: CEA1 / No results found for: CEA1   No results found for: AFPTUMOR  No results found for: CHROMOGRNA  No results found for: KPAFRELGTCHN, LAMBDASER, KAPLAMBRATIO (kappa/lambda light chains)  No results found for: HGBA, HGBA2QUANT, HGBFQUANT, HGBSQUAN (Hemoglobinopathy evaluation)   No results found for: LDH  No results found for: IRON, TIBC, IRONPCTSAT (Iron and TIBC)  No results found for: FERRITIN  Urinalysis    Component Value Date/Time   COLORURINE YELLOW 03/30/2014 1543   APPEARANCEUR CLEAR 03/30/2014 1543   LABSPEC 1.020 03/30/2014 1543   PHURINE 6.5 03/30/2014 1543   GLUCOSEU NEG 03/30/2014 1543   HGBUR TRACE (A) 03/30/2014 1543   BILIRUBINUR NEG 03/30/2014 1543   KETONESUR NEG 03/30/2014 1543   PROTEINUR NEG 03/30/2014 1543   UROBILINOGEN 0.2 03/30/2014 1543   NITRITE NEG 03/30/2014 1543   LEUKOCYTESUR NEG 03/30/2014 1543     STUDIES: US BREAST LTD UNI RIGHT INC AXILLA  Result Date: 03/08/2020 CLINICAL DATA:  Recall from screening to evaluate a right breast mass. EXAM: DIGITAL DIAGNOSTIC right MAMMOGRAM WITH TOMO ULTRASOUND right BREAST COMPARISON:  Previous exam(s). ACR Breast Density Category b: There are scattered areas of fibroglandular density. FINDINGS: Additional spot compression tomographic images demonstrate an 8 mm spiculated mass over the middle to posterior third of the slightly medial upper right breast. Targeted ultrasound is performed, showing an irregular hypo to anechoic mass with indistinct borders and distal shadowing at the 1 o'clock position of the right breast 10 cm from the nipple. This mass measures 5 x 5 x 8 mm and correlates to the mammographic finding. Ultrasound the right axilla is unremarkable. IMPRESSION: Suspicious 8 mm mass over the 1 o'clock position of the  right breast. RECOMMENDATION: Recommend ultrasound-guided core needle biopsy for further evaluation. I have discussed the findings and recommendations with the patient. If applicable, a reminder letter will be sent to the patient regarding the next appointment. BI-RADS CATEGORY  5: Highly suggestive of malignancy. Biopsy will be scheduled here at the [facility] prior to patient's departure. Electronically Signed   By: Marin Olp M.D.   On: 03/08/2020 11:09  MM DIAG BREAST TOMO UNI RIGHT  Result Date: 03/08/2020 CLINICAL DATA:  Recall from screening to evaluate a right breast mass. EXAM: DIGITAL DIAGNOSTIC right MAMMOGRAM WITH TOMO ULTRASOUND right BREAST COMPARISON:  Previous exam(s). ACR Breast Density Category b: There are scattered areas of fibroglandular density. FINDINGS: Additional spot compression tomographic images demonstrate an 8 mm spiculated mass over the middle to posterior third of the slightly medial upper right breast. Targeted ultrasound is performed, showing an irregular hypo to anechoic mass with indistinct borders and distal shadowing at the 1 o'clock position of the right breast 10 cm from the nipple. This mass measures 5 x 5 x 8 mm and correlates to the mammographic finding. Ultrasound the right axilla is unremarkable. IMPRESSION: Suspicious 8 mm mass over the 1 o'clock position of the right breast. RECOMMENDATION: Recommend ultrasound-guided core needle biopsy for further evaluation. I have discussed the findings and recommendations with the patient. If applicable, a reminder letter will be sent to the patient regarding the next appointment. BI-RADS CATEGORY  5: Highly suggestive of malignancy. Biopsy will be scheduled here at the [facility] prior to patient's departure. Electronically Signed   By: Marin Olp M.D.   On: 03/08/2020 11:09   MM 3D SCREEN BREAST BILATERAL  Result Date: 02/29/2020 CLINICAL DATA:  Screening. EXAM: DIGITAL SCREENING  BILATERAL MAMMOGRAM WITH TOMO AND CAD COMPARISON:  Previous exam(s). ACR Breast Density Category c: The breast tissue is heterogeneously dense, which may obscure small masses. FINDINGS: In the right breast, a possible focal asymmetry warrants further evaluation. In the left breast, no findings suspicious for malignancy. Images were processed with CAD. IMPRESSION: Further evaluation is suggested for possible focal asymmetry in the right breast. RECOMMENDATION: Diagnostic mammogram and possibly ultrasound of the right breast. (Code:[id number]) The patient will be contacted regarding the findings, and additional imaging will be scheduled. BI-RADS CATEGORY  0: Incomplete. Need additional imaging evaluation and/or prior mammograms for comparison. Electronically Signed   By: Zerita Boers M.D.   On: 02/29/2020 16:01   MM CLIP PLACEMENT RIGHT  Result Date: 03/11/2020 CLINICAL DATA:  Confirmation of clip placement after ultrasound-guided core needle biopsy of a highly suspicious 8 mm mass involving the UPPER INNER QUADRANT of the RIGHT breast at POSTERIOR depth. EXAM: 2D AND 3D TOMOSYNTHESIS DIAGNOSTIC RIGHT MAMMOGRAM POST ULTRASOUND BIOPSY COMPARISON:  Previous exam(s). FINDINGS: Tomosynthesis and synthesized full field CC and mediolateral images were obtained following ultrasound guided biopsy of a mass involving the UPPER INNER QUADRANT of the RIGHT breast. The ribbon shaped tissue marking clip is appropriately positioned within the biopsied mass and associated architectural distortion. Expected post biopsy changes are present without evidence of hematoma. IMPRESSION: Appropriate positioning of the ribbon shaped tissue marking clip within the biopsied mass and associated architectural distortion in the UPPER INNER QUADRANT of the RIGHT breast at POSTERIOR depth. Final Assessment: Post Procedure Mammograms for Marker Placement Electronically Signed   By: Evangeline Dakin M.D.   On: 03/11/2020 16:02   [place] RT BREAST BX W  LOC DEV 1ST LESION IMG BX SPEC US GUIDE  Addendum Date: 03/12/2020   ADDENDUM REPORT: 03/12/2020 13:41 ADDENDUM: Pathology revealed GRADE II INVASIVE AND IN SITU MAMMARY CARCINOMA of the Right breast, upper inner quadrant at posterior depth, 1 o'clock, 10cmfn, (ribbon clip). This was found to be concordant by Dr. Peggye Fothergill. Pathology results were discussed with the patient by telephone. The patient reported doing well after the biopsy with minimal tenderness at the site. Post biopsy instructions and  care were reviewed and questions were answered. The patient was encouraged to call [facility] for any additional concerns. The patient was referred to [facility] on Mar 20, 2020. Pathology results reported by Terie Purser, RN on 03/12/2020. Electronically Signed   By: Evangeline Dakin M.D.   On: 03/12/2020 13:41   Result Date: 03/12/2020 CLINICAL DATA:  74 year old with a screening detected 8 mm mass associated with architectural distortion involving the UPPER INNER QUADRANT of the RIGHT breast at POSTERIOR depth at the 1 o'clock position approximately 10 cm from the nipple. EXAM: ULTRASOUND GUIDED RIGHT BREAST CORE NEEDLE BIOPSY COMPARISON:  Previous exam(s). PROCEDURE: I met with the patient and we discussed the procedure of ultrasound-guided biopsy, including benefits and alternatives. We discussed the high likelihood of a successful procedure. We discussed the risks of the procedure, including infection, bleeding, tissue injury, clip migration, and inadequate sampling. Informed written consent was given. The usual time-out protocol was performed immediately prior to the procedure. Lesion quadrant: UPPER INNER QUADRANT. Using sterile technique with chlorhexidine as skin antisepsis, 1% lidocaine and 1% lidocaine with epinephrine as local anesthetic, under direct ultrasound visualization, a 12 gauge Bard  Marquee core needle device placed through an 11 gauge introducer needle was used to perform biopsy of the mass involving the UPPER INNER QUADRANT of the RIGHT breast using a medial approach. At the conclusion of the procedure a ribbon shaped tissue marker clip was deployed into the biopsy cavity. Follow up 2 view mammogram was performed and dictated separately. IMPRESSION: Ultrasound guided biopsy of a highly suspicious 8 mm mass involving the UPPER INNER QUADRANT of the RIGHT breast at POSTERIOR depth. No apparent complications. Electronically Signed: By: Evangeline Dakin M.D. On: 03/11/2020 16:02     ELIGIBLE FOR AVAILABLE RESEARCH PROTOCOL: AET  ASSESSMENT: 74 y.o. [place] woman   PLAN: We first reviewed the fact that cancer is not one disease but more than 100 different diseases and that it is important to keep them separate-- otherwise when friends and relatives discuss their own cancer experiences with Dyanne confusion can result. Similarly we explained that if breast cancer spreads to the bone or liver, the patient would not have bone cancer or liver cancer, but breast cancer in the bone and breast cancer in the liver: one cancer in three places-- not 3 different cancers which otherwise would have to be treated in 3 different ways.  We discussed the difference between local and systemic therapy. In terms of loco-regional treatment, lumpectomy plus radiation is equivalent to mastectomy as far as survival is concerned. For this reason, and because the cosmetic results are generally superior, we recommend breast conserving surgery.   We then discussed the rationale for systemic therapy. There is some risk that this cancer may have already spread to other parts of her body. Patients frequently ask at this point about bone scans, CAT scans and PET scans to find out if they have occult breast cancer somewhere else. The problem is that in early stage disease we are much more likely to find false  positives then true cancers and this would expose the patient to unnecessary procedures as well as unnecessary radiation. Scans cannot answer the question the patient really would like to know, which is whether she has microscopic disease elsewhere in her body. For those reasons we do not recommend them.  Of course we would proceed to aggressive evaluation of any symptoms that  might suggest metastatic disease, but that is not the case here.  Next we went over the options for systemic therapy which are anti-estrogens, anti-HER-2 immunotherapy, and chemotherapy. Unika does not meet criteria for anti-HER-2 immunotherapy. She is a good candidate for anti-estrogens.  The question of chemotherapy is more complicated. Chemotherapy is most effective in rapidly growing, aggressive tumors. It is much less effective in low-grade, slow growing cancers, like Elianny 's. For that reason and also given the patient's age and comorbidities we are foregoing Oncotype and not planning on chemotherapy  Radiation oncology believes adjuvant radiation can be also omitted if the patient does take antiestrogens for 5 years.  There is good data for patients over 70 with small node-negative low-grade estrogen receptor positive tumors like this 1 that omitting radiation does not affect survival  The plan then is for surgery and then starting anastrozole.  Keiran has a good understanding of the overall plan. She agrees with it. She knows the goal of treatment in her case is cure. She will call with any problems that may develop before her next visit here.  Total encounter time 65 minutes.Sarajane Jews C. Waris Rodger, MD 03/20/2020 6:04 PM Medical Oncology and Hematology [facility] [place], [place] [address] Tel. [phone]    Fax. [phone]   This document serves as a record of services personally performed by Lurline Del, MD. It was created on his behalf by Wilburn Mylar, a trained  medical scribe. The creation of this record is based on the scribe's personal observations and the provider's statements to them.   I, Lurline Del MD, have reviewed the above documentation for accuracy and completeness, and I agree with the above.   *Total Encounter Time as defined by the Centers for Medicare and Medicaid Services includes, in addition to the face-to-face time of a patient visit (documented in the note above) non-face-to-face time: obtaining and reviewing outside history, ordering and reviewing medications, tests or procedures, care coordination (communications with other health care professionals or caregivers) and documentation in the medical record.

## 2020-03-20 ENCOUNTER — Encounter: Payer: Self-pay | Admitting: *Deleted

## 2020-03-20 ENCOUNTER — Ambulatory Visit
Admission: RE | Admit: 2020-03-20 | Discharge: 2020-03-20 | Disposition: A | Discharge status: Home / Self Care | Payer: Medicare PPO | Source: Ambulatory Visit | Attending: Radiation Oncology | Admitting: Radiation Oncology

## 2020-03-20 ENCOUNTER — Other Ambulatory Visit: Payer: Self-pay

## 2020-03-20 ENCOUNTER — Ambulatory Visit: Payer: Self-pay | Admitting: General Surgery

## 2020-03-20 ENCOUNTER — Ambulatory Visit: Payer: Medicare PPO | Admitting: Physical Therapy

## 2020-03-20 ENCOUNTER — Encounter: Payer: Self-pay | Admitting: Radiation Oncology

## 2020-03-20 ENCOUNTER — Inpatient Hospital Stay: Payer: Medicare PPO | Attending: Oncology | Admitting: Oncology

## 2020-03-20 ENCOUNTER — Telehealth: Payer: Self-pay | Admitting: *Deleted

## 2020-03-20 ENCOUNTER — Encounter: Payer: Self-pay | Admitting: Licensed Clinical Social Worker

## 2020-03-20 ENCOUNTER — Inpatient Hospital Stay: Payer: Medicare PPO

## 2020-03-20 ENCOUNTER — Encounter: Payer: Self-pay | Admitting: Oncology

## 2020-03-20 VITALS — BP 152/60 | HR 84 | Temp 98.7°F | Resp 18 | Ht 64.5 in | Wt 176.9 lb

## 2020-03-20 DIAGNOSIS — Z170 Estrogen receptor positive status [ER+]: Secondary | ICD-10-CM

## 2020-03-20 DIAGNOSIS — M1990 Unspecified osteoarthritis, unspecified site: Secondary | ICD-10-CM

## 2020-03-20 DIAGNOSIS — M2550 Pain in unspecified joint: Secondary | ICD-10-CM

## 2020-03-20 DIAGNOSIS — Z881 Allergy status to other antibiotic agents status: Secondary | ICD-10-CM | POA: Diagnosis not present

## 2020-03-20 DIAGNOSIS — Z8249 Family history of ischemic heart disease and other diseases of the circulatory system: Secondary | ICD-10-CM

## 2020-03-20 DIAGNOSIS — Z800 Family history of malignant neoplasm of digestive organs: Secondary | ICD-10-CM | POA: Diagnosis not present

## 2020-03-20 DIAGNOSIS — Z79899 Other long term (current) drug therapy: Secondary | ICD-10-CM

## 2020-03-20 DIAGNOSIS — C50211 Malignant neoplasm of upper-inner quadrant of right female breast: Secondary | ICD-10-CM | POA: Diagnosis present

## 2020-03-20 DIAGNOSIS — Z8379 Family history of other diseases of the digestive system: Secondary | ICD-10-CM | POA: Diagnosis not present

## 2020-03-20 DIAGNOSIS — Z79811 Long term (current) use of aromatase inhibitors: Secondary | ICD-10-CM | POA: Diagnosis not present

## 2020-03-20 DIAGNOSIS — Z7289 Other problems related to lifestyle: Secondary | ICD-10-CM

## 2020-03-20 DIAGNOSIS — Z87891 Personal history of nicotine dependence: Secondary | ICD-10-CM | POA: Diagnosis not present

## 2020-03-20 DIAGNOSIS — Z880 Allergy status to penicillin: Secondary | ICD-10-CM

## 2020-03-20 LAB — CMP (CANCER CENTER ONLY)
ALT: 20 U/L (ref 0–44)
AST: 20 U/L (ref 15–41)
Albumin: 3.6 g/dL (ref 3.5–5.0)
Alkaline Phosphatase: 53 U/L (ref 38–126)
Anion gap: 8 (ref 5–15)
BUN: 22 mg/dL (ref 8–23)
CO2: 27 mmol/L (ref 22–32)
Calcium: 8.4 mg/dL — ABNORMAL LOW (ref 8.9–10.3)
Chloride: 108 mmol/L (ref 98–111)
Creatinine: 0.76 mg/dL (ref 0.44–1.00)
GFR, Est AFR Am: >60 mL/min (ref >60)
GFR, Estimated: >60 mL/min (ref >60)
Glucose, Bld: 94 mg/dL (ref 70–99)
Potassium: 4 mmol/L (ref 3.5–5.1)
Sodium: 143 mmol/L (ref 135–145)
Total Bilirubin: 0.6 mg/dL (ref 0.3–1.2)
Total Protein: 6.6 g/dL (ref 6.5–8.1)

## 2020-03-20 LAB — CBC WITH DIFFERENTIAL (CANCER CENTER ONLY)
Abs Immature Granulocytes: 0.01 10*3/uL (ref 0.00–0.07)
Basophils Absolute: 0.1 10*3/uL (ref 0.0–0.1)
Basophils Relative: 2 %
Eosinophils Absolute: 0.1 10*3/uL (ref 0.0–0.5)
Eosinophils Relative: 2 %
HCT: 40.8 % (ref 36.0–46.0)
Hemoglobin: 13.6 g/dL (ref 12.0–15.0)
Immature Granulocytes: 0 %
Lymphocytes Relative: 27 %
Lymphs Abs: 1.4 10*3/uL (ref 0.7–4.0)
MCH: 32.5 pg (ref 26.0–34.0)
MCHC: 33.3 g/dL (ref 30.0–36.0)
MCV: 97.4 fL (ref 80.0–100.0)
Monocytes Absolute: 0.6 10*3/uL (ref 0.1–1.0)
Monocytes Relative: 13 %
Neutro Abs: 2.8 10*3/uL (ref 1.7–7.7)
Neutrophils Relative %: 56 %
Platelet Count: 204 10*3/uL (ref 150–400)
RBC: 4.19 MIL/uL (ref 3.87–5.11)
RDW: 13.2 % (ref 11.5–15.5)
WBC Count: 5 10*3/uL (ref 4.0–10.5)
nRBC: 0 % (ref 0.0–0.2)

## 2020-03-20 LAB — GENETIC SCREENING ORDER

## 2020-03-20 NOTE — Progress Notes (Signed)
Radiation Oncology         [phone] ________________________________  Initial outpatient Consultation  Name: Judy Chapman MRN: [id number]  Date: 03/20/2020  DOB: 09/24/1946  JS:[id number], Elta Guadeloupe, MD  Jovita Kussmaul, MD   REFERRING PHYSICIAN: Autumn Messing III, MD  DIAGNOSIS:    ICD-10-CM   1. Malignant neoplasm of upper-inner quadrant of right breast in female, estrogen receptor positive ([place])  C50.211    Z17.0    Cancer Staging Malignant neoplasm of upper-inner quadrant of right breast in female, estrogen receptor positive ([place]) Staging form: Breast, AJCC 8th Edition - Clinical stage from 03/20/2020: Stage IA (cT1b, cN0, cM0, G2, ER+, PR+, HER2-) - Unsigned   CHIEF COMPLAINT: Here to discuss management of right breast cancer  HISTORY OF PRESENT ILLNESS::Judy Chapman is a 74 y.o. female who presented with breast abnormality on the following imaging: screening mammogram on the date of 02/29/20.  Symptoms, if any, at that time, were: none.   Ultrasound of breast on 03/08/20 revealed 8 mm right breast mass at 1 o'clock.   Biopsy on date of 03/11/20 showed invasive and in situ lobular carcinoma.  ER status: 90%; PR status 95%, Her2 status negative; Grade 2.  She has a right pacemaker.  She is in her usual state of health.  She reports that she did not get a mammogram last year due to Covid.  Her husband is here with her today.  Her daughter lives in [place].  PREVIOUS RADIATION THERAPY: No  PAST MEDICAL HISTORY:  has a past medical history of ACE inhibitor intolerance, Endometriosis, Heart disease, Hypertension, Osteopenia, Pacemaker-MDT, Pneumonia, PVC (premature ventricular contraction), and Sinus node dysfunction ([place]).    PAST SURGICAL HISTORY: Past Surgical History:  Procedure Laterality Date  . BREAST LUMPECTOMY     Benign  . CORONARY ANGIOPLASTY    . DILATION AND CURETTAGE OF UTERUS    . GALLBLADDER SURGERY    . HYSTEROSCOPY    . PACEMAKER INSERTION     Medtronic  . TUBAL  LIGATION    . VAGINAL HYSTERECTOMY      FAMILY HISTORY: family history includes Hypertension in her mother; Stomach cancer (age of onset: 74) in her paternal grandfather.  SOCIAL HISTORY:  reports that she quit smoking about 14 years ago. Her smoking use included cigarettes. She has a 3.00 pack-year smoking history. She has never used smokeless tobacco. She reports current alcohol use of about 10.0 standard drinks of alcohol per week. She reports that she does not use drugs.  ALLERGIES: Augmentin [amoxicillin-pot clavulanate], Macrodantin [nitrofurantoin], and Ramipril  MEDICATIONS:  Current Outpatient Medications  Medication Sig Dispense Refill  . allopurinol (ZYLOPRIM) 100 MG tablet Take 200 mg by mouth daily.    . Cholecalciferol (VITAMIN D PO) Take 1 tablet by mouth daily.     . citalopram (CELEXA) 20 MG tablet Take 1 tablet (20 mg total) by mouth daily. 90 tablet 2  . estradiol (ESTRACE) 0.5 MG tablet Take 1 tablet (0.5 mg total) by mouth daily. 90 tablet 4  . nebivolol (BYSTOLIC) 10 MG tablet Take 10 mg by mouth daily.     No current facility-administered medications for this encounter.    REVIEW OF SYSTEMS: As above   PHYSICAL EXAM:  vitals were not taken for this visit.   General: Alert and oriented, in no acute distress Psychiatric: Judgment and insight are intact. Affect is appropriate. Breasts: No palpable masses appreciated in the breasts or axillae bilaterally.    ECOG =  0  0 - Asymptomatic (Fully active, able to carry on all predisease activities without restriction)  1 - Symptomatic but completely ambulatory (Restricted in physically strenuous activity but ambulatory and able to carry out work of a light or sedentary nature. For example, light housework, office work)  2 - Symptomatic, <50% in bed during the day (Ambulatory and capable of all self care but unable to carry out any work activities. Up and about more than 50% of waking hours)  3 - Symptomatic, >50%  in bed, but not bedbound (Capable of only limited self-care, confined to bed or chair 50% or more of waking hours)  4 - Bedbound (Completely disabled. Cannot carry on any self-care. Totally confined to bed or chair)  5 - Death   Eustace Pen MM, Creech RH, Tormey DC, et al. [phone]). "Toxicity and response criteria of the [facility]". [place] Oncol. 5 (6): 649-55   LABORATORY DATA:  Lab Results  Component Value Date   WBC 5.0 03/20/2020   HGB 13.6 03/20/2020   HCT 40.8 03/20/2020   MCV 97.4 03/20/2020   PLT 204 03/20/2020   CMP     Component Value Date/Time   NA 143 03/20/2020 0827   K 4.0 03/20/2020 0827   CL 108 03/20/2020 0827   CO2 27 03/20/2020 0827   GLUCOSE 94 03/20/2020 0827   BUN 22 03/20/2020 0827   CREATININE 0.76 03/20/2020 0827   CALCIUM 8.4 (L) 03/20/2020 0827   PROT 6.6 03/20/2020 0827   ALBUMIN 3.6 03/20/2020 0827   AST 20 03/20/2020 0827   ALT 20 03/20/2020 0827   ALKPHOS 53 03/20/2020 0827   BILITOT 0.6 03/20/2020 0827   GFRNONAA >60 03/20/2020 0827   GFRAA >60 03/20/2020 0827         RADIOGRAPHY: US BREAST LTD UNI RIGHT INC AXILLA  Result Date: 03/08/2020 CLINICAL DATA:  Recall from screening to evaluate a right breast mass. EXAM: DIGITAL DIAGNOSTIC right MAMMOGRAM WITH TOMO ULTRASOUND right BREAST COMPARISON:  Previous exam(s). ACR Breast Density Category b: There are scattered areas of fibroglandular density. FINDINGS: Additional spot compression tomographic images demonstrate an 8 mm spiculated mass over the middle to posterior third of the slightly medial upper right breast. Targeted ultrasound is performed, showing an irregular hypo to anechoic mass with indistinct borders and distal shadowing at the 1 o'clock position of the right breast 10 cm from the nipple. This mass measures 5 x 5 x 8 mm and correlates to the mammographic finding. Ultrasound the right axilla is unremarkable. IMPRESSION: Suspicious 8 mm mass over the 1 o'clock  position of the right breast. RECOMMENDATION: Recommend ultrasound-guided core needle biopsy for further evaluation. I have discussed the findings and recommendations with the patient. If applicable, a reminder letter will be sent to the patient regarding the next appointment. BI-RADS CATEGORY  5: Highly suggestive of malignancy. Biopsy will be scheduled here at the [facility] prior to patient's departure. Electronically Signed   By: Marin Olp M.D.   On: 03/08/2020 11:09   MM DIAG BREAST TOMO UNI RIGHT  Result Date: 03/08/2020 CLINICAL DATA:  Recall from screening to evaluate a right breast mass. EXAM: DIGITAL DIAGNOSTIC right MAMMOGRAM WITH TOMO ULTRASOUND right BREAST COMPARISON:  Previous exam(s). ACR Breast Density Category b: There are scattered areas of fibroglandular density. FINDINGS: Additional spot compression tomographic images demonstrate an 8 mm spiculated mass over the middle to posterior third of the slightly medial upper right breast. Targeted ultrasound is  performed, showing an irregular hypo to anechoic mass with indistinct borders and distal shadowing at the 1 o'clock position of the right breast 10 cm from the nipple. This mass measures 5 x 5 x 8 mm and correlates to the mammographic finding. Ultrasound the right axilla is unremarkable. IMPRESSION: Suspicious 8 mm mass over the 1 o'clock position of the right breast. RECOMMENDATION: Recommend ultrasound-guided core needle biopsy for further evaluation. I have discussed the findings and recommendations with the patient. If applicable, a reminder letter will be sent to the patient regarding the next appointment. BI-RADS CATEGORY  5: Highly suggestive of malignancy. Biopsy will be scheduled here at the [facility] prior to patient's departure. Electronically Signed   By: Marin Olp M.D.   On: 03/08/2020 11:09   MM 3D SCREEN BREAST BILATERAL  Result Date: 02/29/2020 CLINICAL DATA:  Screening. EXAM:  DIGITAL SCREENING BILATERAL MAMMOGRAM WITH TOMO AND CAD COMPARISON:  Previous exam(s). ACR Breast Density Category c: The breast tissue is heterogeneously dense, which may obscure small masses. FINDINGS: In the right breast, a possible focal asymmetry warrants further evaluation. In the left breast, no findings suspicious for malignancy. Images were processed with CAD. IMPRESSION: Further evaluation is suggested for possible focal asymmetry in the right breast. RECOMMENDATION: Diagnostic mammogram and possibly ultrasound of the right breast. (Code:[id number]) The patient will be contacted regarding the findings, and additional imaging will be scheduled. BI-RADS CATEGORY  0: Incomplete. Need additional imaging evaluation and/or prior mammograms for comparison. Electronically Signed   By: Zerita Boers M.D.   On: 02/29/2020 16:01   MM CLIP PLACEMENT RIGHT  Result Date: 03/11/2020 CLINICAL DATA:  Confirmation of clip placement after ultrasound-guided core needle biopsy of a highly suspicious 8 mm mass involving the UPPER INNER QUADRANT of the RIGHT breast at POSTERIOR depth. EXAM: 2D AND 3D TOMOSYNTHESIS DIAGNOSTIC RIGHT MAMMOGRAM POST ULTRASOUND BIOPSY COMPARISON:  Previous exam(s). FINDINGS: Tomosynthesis and synthesized full field CC and mediolateral images were obtained following ultrasound guided biopsy of a mass involving the UPPER INNER QUADRANT of the RIGHT breast. The ribbon shaped tissue marking clip is appropriately positioned within the biopsied mass and associated architectural distortion. Expected post biopsy changes are present without evidence of hematoma. IMPRESSION: Appropriate positioning of the ribbon shaped tissue marking clip within the biopsied mass and associated architectural distortion in the UPPER INNER QUADRANT of the RIGHT breast at POSTERIOR depth. Final Assessment: Post Procedure Mammograms for Marker Placement Electronically Signed   By: Evangeline Dakin M.D.   On: 03/11/2020 16:02    [place] RT BREAST BX W LOC DEV 1ST LESION IMG BX SPEC US GUIDE  Addendum Date: 03/12/2020   ADDENDUM REPORT: 03/12/2020 13:41 ADDENDUM: Pathology revealed GRADE II INVASIVE AND IN SITU MAMMARY CARCINOMA of the Right breast, upper inner quadrant at posterior depth, 1 o'clock, 10cmfn, (ribbon clip). This was found to be concordant by Dr. Peggye Fothergill. Pathology results were discussed with the patient by telephone. The patient reported doing well after the biopsy with minimal tenderness at the site. Post biopsy instructions and care were reviewed and questions were answered. The patient was encouraged to call [facility] for any additional concerns. The patient was referred to [facility] on Mar 20, 2020. Pathology results reported by Terie Purser, RN on 03/12/2020. Electronically Signed   By: Evangeline Dakin M.D.   On: 03/12/2020 13:41   Result Date: 03/12/2020 CLINICAL DATA:  74 year old  with a screening detected 8 mm mass associated with architectural distortion involving the UPPER INNER QUADRANT of the RIGHT breast at POSTERIOR depth at the 1 o'clock position approximately 10 cm from the nipple. EXAM: ULTRASOUND GUIDED RIGHT BREAST CORE NEEDLE BIOPSY COMPARISON:  Previous exam(s). PROCEDURE: I met with the patient and we discussed the procedure of ultrasound-guided biopsy, including benefits and alternatives. We discussed the high likelihood of a successful procedure. We discussed the risks of the procedure, including infection, bleeding, tissue injury, clip migration, and inadequate sampling. Informed written consent was given. The usual time-out protocol was performed immediately prior to the procedure. Lesion quadrant: UPPER INNER QUADRANT. Using sterile technique with chlorhexidine as skin antisepsis, 1% lidocaine and 1% lidocaine with epinephrine as local anesthetic, under direct ultrasound  visualization, a 12 gauge Bard Marquee core needle device placed through an 11 gauge introducer needle was used to perform biopsy of the mass involving the UPPER INNER QUADRANT of the RIGHT breast using a medial approach. At the conclusion of the procedure a ribbon shaped tissue marker clip was deployed into the biopsy cavity. Follow up 2 view mammogram was performed and dictated separately. IMPRESSION: Ultrasound guided biopsy of a highly suspicious 8 mm mass involving the UPPER INNER QUADRANT of the RIGHT breast at POSTERIOR depth. No apparent complications. Electronically Signed: By: Evangeline Dakin M.D. On: 03/11/2020 16:02      IMPRESSION/PLAN: This is a delightful 74 year old woman with right Breast Cancer  She plans on breast conserving surgery.  For the patient's early stage favorable risk breast cancer, we had a thorough discussion about her options for adjuvant therapy.  She is enthusiastic about taking antiestrogen therapy as discussed with medical oncology.  The most aggressive option would be to pursue both radiotherapy to the right breast and antiestrogen therapy adjuvantly (but I told her I thought this would likely be more therapy than necessary).   We discussed that in order for her to safely receive adjuvant radiotherapy to the whole breast, she would need to have her pacemaker switched to the opposite side of her chest.  We discussed that radiotherapy would only carry a modest benefit in terms of reducing the risk of local recurrence in her right breast, but it would not improve her life expectancy.  Her chance of cure is excellent with lumpectomy followed by the antiestrogen pill.  She is enthusiastic about this plan and understands importance of close follow-up with mammograms.  She understands that radiotherapy is optional.  Following our discussion of the risks benefits and side effects of radiotherapy, patient and I agree that it can be held from her treatment plan. Our team will  review her final pathology after surgery at our tumor board.  Unless her pathology reveals more high risk disease than anticipated, she will pursue antiestrogen therapy only after surgery and I will just see her back on an as-needed basis.  She and her husband are very pleased with this plan.  On date of service, in total, I spent 45 minutes on this encounter.  Patient was seen in person.  __________________________________________   Eppie Gibson, MD   This document serves as a record of services personally performed by Eppie Gibson, MD. It was created on her behalf by Wilburn Mylar, a trained medical scribe. The creation of this record is based on the scribe's personal observations and the provider's statements to them. This document has been checked and approved by the attending provider.

## 2020-03-20 NOTE — Progress Notes (Signed)
Clinical Social Work [place] Psychosocial Distress Screening [place]   Patient completed distress screening protocol and scored a 4 on the Psychosocial Distress Thermometer which indicates mild distress. Clinical Education officer, museum met with patient and patient's Spouse, Delfino Lovett, in [facility] to assess for distress and other psychosocial needs.  Patient stated she was feeling "pretty good" and husband was overwhelmed but felt better after meeting with the treatment team and getting more information on her treatment plan.    Patient reports having a strong support system in her husband, two daughters and their families, and her friends. She has one friend who had a similar diagnosis a few years ago with whom she has been speaking.  CSW and patient discussed common feeling and emotions when being diagnosed with cancer, and the importance of support during treatment.  CSW informed patient of the support team and support services at [facility].  CSW provided contact information and encouraged patient to call with any questions or concerns.    Distress Screen: ONCBCN DISTRESS SCREENING 03/20/2020  Screening Type Initial Screening  Distress experienced in past week (1-10) 4  Emotional problem type Nervousness/Anxiety     [place]

## 2020-03-20 NOTE — Telephone Encounter (Signed)
    [facility] Pre-operative Risk Assessment    HEARTCARE STAFF: - Please ensure there is not already an duplicate clearance open for this procedure. - Under Visit Info/Reason for Call, type in Other and utilize the format Clearance MM/DD/YY or Clearance TBD. Do not use dashes or single digits. - If request is for dental extraction, please clarify the # of teeth to be extracted.  Request for surgical clearance:  1. What type of surgery is being performed? LUMPECTOMY   2. When is this surgery scheduled? TBD   3. What type of clearance is required (medical clearance vs. Pharmacy clearance to hold med vs. Both)? MEDICAL  4. Are there any medications that need to be held prior to surgery and how long? NONE LISTED   5. Practice name and name of physician performing surgery? [facility]; DR. PAUL TOTH   6. What is the office phone number? [phone]   7. 7.   What is the office fax number? Judy Chapman   8.   Anesthesia type (None, local, MAC, general) ? GENERAL   Judy Chapman 03/20/2020, 2:58 PM  _________________________________________________________________   (provider comments below)

## 2020-03-21 ENCOUNTER — Telehealth: Payer: Self-pay | Admitting: Oncology

## 2020-03-21 ENCOUNTER — Other Ambulatory Visit: Payer: Medicare PPO

## 2020-03-21 ENCOUNTER — Other Ambulatory Visit: Payer: Self-pay | Admitting: Oncology

## 2020-03-21 NOTE — Telephone Encounter (Signed)
   Primary Cardiologist: Virl Axe, MD  Chart reviewed as part of pre-operative protocol coverage. Given past medical history and time since last visit, based on ACC/AHA guidelines, ZARIA FOSBERG would be at acceptable risk for the planned procedure without further cardiovascular testing.   I will route this recommendation to the requesting party via Epic fax function and remove from pre-op pool.  Please call with questions.  Jossie Ng. Hisayo Delossantos NP-C    03/21/2020, 9:31 AM [place] [address] Office [phone] Fax [phone]

## 2020-03-21 NOTE — Telephone Encounter (Signed)
Scheduled appt per 5/26 los. Pt confirmed appt date and time.

## 2020-03-22 ENCOUNTER — Encounter: Payer: Self-pay | Admitting: Internal Medicine

## 2020-03-22 ENCOUNTER — Other Ambulatory Visit: Payer: Self-pay | Admitting: General Surgery

## 2020-03-22 DIAGNOSIS — C50211 Malignant neoplasm of upper-inner quadrant of right female breast: Secondary | ICD-10-CM

## 2020-03-22 NOTE — Progress Notes (Signed)
Cardiac device sheet faxed to [facility]. Dannial Monarch notified of pt's surgery date and time.

## 2020-03-22 NOTE — Progress Notes (Signed)
PERIOPERATIVE PRESCRIPTION FOR IMPLANTED CARDIAC DEVICE PROGRAMMING  Patient Information: Name:  Judy Chapman  DOB:  Mar 24, 1946  MRN:  [id number]    Planned Procedure: Right Breast Radioactive Seed Localized Lumpectony  Surgeon: Dr. Marlou Starks  Date of Procedure: 03/27/20  Cautery will be used.  Position during surgery: supine   Please send documentation back to:  [facility] (Fax # [phone])   Lorin Glass, RN  03/22/2020 10:44 AM   Device Information:  Clinic EP Physician:  Virl Axe, MD   Device Type:  Pacemaker Manufacturer and Phone #:  Medtronic: [id number] Pacemaker Dependent?:  No. Date of Last Device Check:  01/22/20 Normal Device Function?:  Yes.    Electrophysiologist's Recommendations:   Have magnet available.  Provide continuous ECG monitoring when magnet is used or reprogramming is to be performed.   Procedure may interfere with device function.  Magnet should be placed over device during procedure.  Per Device Clinic Standing Orders, York Ram, RN  5:26 PM 03/22/2020

## 2020-03-22 NOTE — Pre-Procedure Instructions (Signed)
NORAA SCHAUMANN  03/22/2020      [id number] - [place], [place] - [place] [place] [address] Phone: [phone] Fax: [phone]  [facility] [address], [place] - [place] AT [place] [address] Phone: [phone] Fax: [phone]    Your procedure is scheduled on June 2  Report to [facility] Entrance A at 10:30 A.M.  Call this number if you have problems the morning of surgery:  [phone]   Remember:  Do not eat  after midnight.   You may drink clear liquids until 9:30 A.M..  Clear liquids allowed are:                    Water, Juice (non-citric and without pulp - diabetics please choose diet or no sugar options), Carbonated beverages - (diabetics please choose diet or no sugar options), Clear Tea, Black Coffee only (no creamer, milk or cream including half and half), Plain Jell-O only (diabetics please choose diet or no sugar options), Gatorade (diabetics please choose diet or no sugar options) and Plain Popsicles only    Take these medicines the morning of surgery with A SIP OF WATER :               Allopurinol (zyloprim)              Citalopram (celexa)              nebivolol (bystolic)             7 days prior to surgery STOP taking any Aspirin (unless otherwise instructed by your surgeon), Aleve, Naproxen, Ibuprofen, Motrin, Advil, Goody's, BC's, all herbal medications, fish oil, and all vitamins.    Do not wear jewelry, make-up or nail polish.  Do not wear lotions, powders, or perfumes, or deodorant.  Do not shave 48 hours prior to surgery.  Men may shave face and neck.  Do not bring valuables to the hospital.  [facility] is not responsible for any belongings or valuables.  Contacts, dentures or bridgework may not be worn into surgery.  Leave your suitcase in the car.  After surgery it may be brought to your room.  For  patients admitted to the hospital, discharge time will be determined by your treatment team.  Patients discharged the day of surgery will not be allowed to drive home.    Special instructions:  [facility]- Preparing For Surgery  Before surgery, you can play an important role. Because skin is not sterile, your skin needs to be as free of germs as possible. You can reduce the number of germs on your skin by washing with CHG (chlorahexidine gluconate) Soap before surgery.  CHG is an antiseptic cleaner which kills germs and bonds with the skin to continue killing germs even after washing.    Oral Hygiene is also important to reduce your risk of infection.  Remember - BRUSH YOUR TEETH THE MORNING OF SURGERY WITH YOUR REGULAR TOOTHPASTE  Please do not use if you have an allergy to CHG or antibacterial soaps. If your skin becomes reddened/irritated stop using the CHG.  Do not shave (including legs and underarms) for at least 48 hours prior to first CHG shower. It is OK to shave your face.  Please follow these instructions carefully.   1. Shower the NIGHT BEFORE SURGERY and the MORNING OF SURGERY with CHG.   2.  If you chose to wash your hair, wash your hair first as usual with your normal shampoo.  3. After you shampoo, rinse your hair and body thoroughly to remove the shampoo.  4. Use CHG as you would any other liquid soap. You can apply CHG directly to the skin and wash gently with a scrungie or a clean washcloth.   5. Apply the CHG Soap to your body ONLY FROM THE NECK DOWN.  Do not use on open wounds or open sores. Avoid contact with your eyes, ears, mouth and genitals (private parts). Wash Face and genitals (private parts)  with your normal soap.  6. Wash thoroughly, paying special attention to the area where your surgery will be performed.  7. Thoroughly rinse your body with warm water from the neck down.  8. DO NOT shower/wash with your normal soap after using and rinsing off the CHG  Soap.  9. Pat yourself dry with a CLEAN TOWEL.  10. Wear CLEAN PAJAMAS to bed the night before surgery, wear comfortable clothes the morning of surgery  11. Place CLEAN SHEETS on your bed the night of your first shower and DO NOT SLEEP WITH PETS.    Day of Surgery:  Do not apply any deodorants/lotions.  Please wear clean clothes to the hospital/surgery center.   Remember to brush your teeth WITH YOUR REGULAR TOOTHPASTE.    Please read over the following fact sheets that you were given. Coughing and Deep Breathing and Surgical Site Infection Prevention

## 2020-03-23 ENCOUNTER — Other Ambulatory Visit (HOSPITAL_COMMUNITY)
Admission: RE | Admit: 2020-03-23 | Discharge: 2020-03-23 | Disposition: A | Discharge status: Home / Self Care | Payer: Medicare PPO | Source: Ambulatory Visit | Attending: General Surgery | Admitting: General Surgery

## 2020-03-23 DIAGNOSIS — Z01812 Encounter for preprocedural laboratory examination: Secondary | ICD-10-CM | POA: Diagnosis present

## 2020-03-23 DIAGNOSIS — Z20822 Contact with and (suspected) exposure to covid-19: Secondary | ICD-10-CM | POA: Insufficient documentation

## 2020-03-23 LAB — SARS CORONAVIRUS 2 (TAT 6-24 HRS): SARS Coronavirus 2: NEGATIVE

## 2020-03-26 ENCOUNTER — Encounter (HOSPITAL_COMMUNITY): Payer: Self-pay

## 2020-03-26 ENCOUNTER — Other Ambulatory Visit: Payer: Self-pay

## 2020-03-26 ENCOUNTER — Encounter (HOSPITAL_COMMUNITY)
Admission: RE | Admit: 2020-03-26 | Discharge: 2020-03-26 | Disposition: A | Discharge status: Home / Self Care | Payer: Medicare PPO | Source: Ambulatory Visit | Attending: General Surgery | Admitting: General Surgery

## 2020-03-26 DIAGNOSIS — J479 Bronchiectasis, uncomplicated: Secondary | ICD-10-CM | POA: Insufficient documentation

## 2020-03-26 DIAGNOSIS — Z950 Presence of cardiac pacemaker: Secondary | ICD-10-CM | POA: Insufficient documentation

## 2020-03-26 DIAGNOSIS — Z01812 Encounter for preprocedural laboratory examination: Secondary | ICD-10-CM | POA: Insufficient documentation

## 2020-03-26 HISTORY — DX: Unspecified osteoarthritis, unspecified site: M19.90

## 2020-03-26 HISTORY — DX: Unspecified asthma, uncomplicated: J45.909

## 2020-03-26 HISTORY — DX: Presence of spectacles and contact lenses: Z97.3

## 2020-03-26 HISTORY — DX: Unspecified cataract: H26.9

## 2020-03-26 HISTORY — DX: Gout, unspecified: M10.9

## 2020-03-26 HISTORY — DX: Malignant (primary) neoplasm, unspecified: C80.1

## 2020-03-26 NOTE — Progress Notes (Signed)
Pt denies SOB and chest pain. Pt stated that she is under the care of Dr. Joylene Draft, PCP and Dr. Caryl Comes, Cardiology. Pt denies having a stress test but stated that a a cardiac cath was performed > 10 years ago. Pt denies having a chest x ray . Pt reminded to quarantine. Pt verbalized understanding of all pre-op instructions. PA, Anesthesiology, made aware of order for consult; see note.

## 2020-03-26 NOTE — Progress Notes (Signed)
Anesthesia Chart Review:  Follows with EP cardiology for hx of sinus arrest and asystole. She is s/p pacemaker implantation. It was initially implanted in 2002 with device generator replacement March 2010. Last seen by Dr. Caryl Comes 12/13/19. Per note, "Chronotropic incompetence  >> reprogramming of the rate response  ADL slope 3>>2 and Exercise sloepe 3>>4". Clearance per telephone encounter 03/21/20, "Chart reviewed as part of pre-operative protocol coverage. Given past medical history and time since last visit, based on ACC/AHA guidelines, Judy Chapman would be at acceptable risk for the planned procedure without further cardiovascular testing."  Periop device recommendations per progress note 03/22/20: Device Information:  Clinic EP Physician:  Virl Axe, MD   Device Type:  Pacemaker Manufacturer and Phone #:  Medtronic: [phone] Pacemaker Dependent?:  No. Date of Last Device Check:  01/22/20           Normal Device Function?:  Yes.    Electrophysiologist's Recommendations:   Have magnet available.  Provide continuous ECG monitoring when magnet is used or reprogramming is to be performed.   Procedure may interfere with device function.  Magnet should be placed over device during procedure.    Preop labs reviewed, unremarkable.   EKG 12/13/19: A pacing at 85 bpm.  Coronary CT 05/05/19: IMPRESSION: 1. Patient's total coronary artery calcium score is 0. This indicates a very low (but non-zero) risk of major adverse cardiovascular events over the next 10 years. 2. Mild peripheral bronchiolectasis.   Wynonia Musty [facility] Short Stay Center/Anesthesiology Phone [phone] 03/26/2020 9:01 AM\

## 2020-03-26 NOTE — Anesthesia Preprocedure Evaluation (Addendum)
Anesthesia Evaluation  Patient identified by MRN, date of birth, ID band Patient awake    Reviewed: Allergy & Precautions, NPO status , Patient's Chart, lab work & pertinent test results  Airway Mallampati: I  TM Distance: >3 FB Neck ROM: Full    Dental  (+) Teeth Intact, Dental Advisory Given   Pulmonary former smoker,    breath sounds clear to auscultation       Cardiovascular hypertension, + pacemaker  Rhythm:Regular Rate:Normal     Neuro/Psych negative neurological ROS  negative psych ROS   GI/Hepatic negative GI ROS, Neg liver ROS,   Endo/Other  negative endocrine ROS  Renal/GU negative Renal ROS     Musculoskeletal  (+) Arthritis ,   Abdominal Normal abdominal exam  (+)   Peds  Hematology negative hematology ROS (+)   Anesthesia Other Findings   Reproductive/Obstetrics                            Anesthesia Physical Anesthesia Plan  ASA: III  Anesthesia Plan: General   Post-op Pain Management:    Induction: Intravenous  PONV Risk Score and Plan: 4 or greater and Ondansetron and Treatment may vary due to age or medical condition  Airway Management Planned: LMA  Additional Equipment: None  Intra-op Plan:   Post-operative Plan: Extubation in OR  Informed Consent:   Plan Discussed with: CRNA  Anesthesia Plan Comments: (PAT note by Karoline Caldwell, PA-C: Follows with EP cardiology for hx of sinus arrest and asystole. She is s/p pacemaker implantation. It was initially implanted in 2002 with device generator replacement March 2010. Last seen by Dr. Caryl Comes 12/13/19. Per note, "Chronotropic incompetence  >> reprogramming of the rate response  ADL slope 3>>2 and Exercise sloepe 3>>4". Clearance per telephone encounter 03/21/20, "Chart reviewed as part of pre-operative protocol coverage. Given past medical history and time since last visit, based on ACC/AHA guidelines, NEGAR PASHIA  would be at acceptable risk for the planned procedure without further cardiovascular testing."  Periop device recommendations per progress note 03/22/20: Device Information:  Clinic EP Physician:  Virl Axe, MD   Device Type:  Pacemaker Manufacturer and Phone #:  Medtronic: [phone] Pacemaker Dependent?:  No. Date of Last Device Check:  01/22/20           Normal Device Function?:  Yes.    Electrophysiologist's Recommendations:  Have magnet available. Provide continuous ECG monitoring when magnet is used or reprogramming is to be performed.  Procedure may interfere with device function.  Magnet should be placed over device during procedure.    Preop labs reviewed, unremarkable.   EKG 12/13/19: A pacing at 85 bpm.  Coronary CT 05/05/19: IMPRESSION: 1. Patient's total coronary artery calcium score is 0. This indicates a very low (but non-zero) risk of major adverse cardiovascular events over the next 10 years. 2. Mild peripheral bronchiolectasis.  )       Anesthesia Quick Evaluation

## 2020-03-26 NOTE — Pre-Procedure Instructions (Addendum)
Judy Chapman  03/26/2020      Your procedure is scheduled on  Tuesday, March 27, 2020  Report to [facility] Entrance A at 11:30 A.M.  Call this number if you have problems the morning of surgery:  [phone]   Remember:  Do not eat  after midnight .   You may drink clear liquids until 10:30 A.M. the morning of surgery.  Clear liquids allowed are: Water, Juice (non-citric and without pulp - diabetics please choose diet or no sugar options), Carbonated beverages - (diabetics please choose diet or no sugar options), Clear Tea, Black Coffee only (no creamer, milk or cream including half and half), Plain Jell-O only (diabetics please choose diet or no sugar options), Gatorade (diabetics please choose diet or no sugar options) and Plain Popsicles only Please finish your Ensure Pre-Surgery Drink by 10:30 A.M. ( Do not sip).    Take these medicines the morning of surgery with A SIP OF WATER :               Allopurinol (zyloprim)              Citalopram (celexa)              nebivolol (bystolic)             7 days prior to surgery STOP taking any Aspirin (unless otherwise instructed by your surgeon), Aleve, Naproxen, Ibuprofen, Motrin, Advil, Goody's, BC's, all herbal medications, fish oil, and all vitamins.    Do not wear jewelry, make-up or nail polish.  Do not wear lotions, powders, or perfumes, or deodorant.  Do not shave 48 hours prior to surgery.   Do not bring valuables to the hospital.  [facility] is not responsible for any belongings or valuables.  Contacts, dentures or bridgework may not be worn into surgery.  Leave your suitcase in the car.  After surgery it may be brought to your room.  For patients admitted to the hospital, discharge time will be determined by your treatment team.  Patients discharged the day of surgery will not be allowed to drive home.    Special instructions:  [place]- Preparing For Surgery  Before surgery, you can play an important role.  Because skin is not sterile, your skin needs to be as free of germs as possible. You can reduce the number of germs on your skin by washing with CHG (chlorahexidine gluconate) Soap before surgery.  CHG is an antiseptic cleaner which kills germs and bonds with the skin to continue killing germs even after washing.    Oral Hygiene is also important to reduce your risk of infection.  Remember - BRUSH YOUR TEETH THE MORNING OF SURGERY WITH YOUR REGULAR TOOTHPASTE  Please do not use if you have an allergy to CHG or antibacterial soaps. If your skin becomes reddened/irritated stop using the CHG.  Do not shave (including legs and underarms) for at least 48 hours prior to first CHG shower. It is OK to shave your face.  Please follow these instructions carefully.   1. Shower the NIGHT BEFORE SURGERY and the MORNING OF SURGERY with CHG.   2. If you chose to wash your hair, wash your hair first as usual with your normal shampoo.  3. After you shampoo, rinse your hair and body thoroughly to remove the shampoo.  4. Use CHG as you would any other liquid soap. You can apply CHG directly to the skin and wash gently with a scrungie  or a clean washcloth.   5. Apply the CHG Soap to your body ONLY FROM THE NECK DOWN.  Do not use on open wounds or open sores. Avoid contact with your eyes, ears, mouth and genitals (private parts). Wash Face and genitals (private parts)  with your normal soap.  6. Wash thoroughly, paying special attention to the area where your surgery will be performed.  7. Thoroughly rinse your body with warm water from the neck down.  8. DO NOT shower/wash with your normal soap after using and rinsing off the CHG Soap.  9. Pat yourself dry with a CLEAN TOWEL.  10. Wear CLEAN PAJAMAS to bed the night before surgery, wear comfortable clothes the morning of surgery  11. Place CLEAN SHEETS on your bed the night of your first shower and DO NOT SLEEP WITH PETS.    Day of Surgery:  Do not  apply any deodorants/lotions.  Please wear clean clothes to the hospital/surgery center.   Remember to brush your teeth WITH YOUR REGULAR TOOTHPASTE.    Please read over the following fact sheets that you were given. Coughing and Deep Breathing and Surgical Site Infection Prevention

## 2020-03-27 ENCOUNTER — Ambulatory Visit (HOSPITAL_COMMUNITY)
Admission: RE | Admit: 2020-03-27 | Discharge: 2020-03-27 | Disposition: A | Discharge status: Home / Self Care | Payer: Medicare PPO | Attending: General Surgery | Admitting: General Surgery

## 2020-03-27 ENCOUNTER — Ambulatory Visit (HOSPITAL_COMMUNITY): Payer: Medicare PPO | Admitting: Physician Assistant

## 2020-03-27 ENCOUNTER — Ambulatory Visit
Admission: RE | Admit: 2020-03-27 | Discharge: 2020-03-27 | Disposition: A | Discharge status: Home / Self Care | Payer: Medicare PPO | Source: Ambulatory Visit | Attending: General Surgery | Admitting: General Surgery

## 2020-03-27 ENCOUNTER — Encounter: Payer: Self-pay | Admitting: *Deleted

## 2020-03-27 ENCOUNTER — Encounter (HOSPITAL_COMMUNITY)
Admission: RE | Disposition: A | Discharge status: Home / Self Care | Payer: Self-pay | Source: Home / Self Care | Attending: General Surgery

## 2020-03-27 ENCOUNTER — Other Ambulatory Visit: Payer: Self-pay

## 2020-03-27 ENCOUNTER — Encounter (HOSPITAL_COMMUNITY): Payer: Self-pay | Admitting: General Surgery

## 2020-03-27 DIAGNOSIS — Z87891 Personal history of nicotine dependence: Secondary | ICD-10-CM | POA: Diagnosis not present

## 2020-03-27 DIAGNOSIS — Z950 Presence of cardiac pacemaker: Secondary | ICD-10-CM | POA: Diagnosis not present

## 2020-03-27 DIAGNOSIS — M1990 Unspecified osteoarthritis, unspecified site: Secondary | ICD-10-CM | POA: Diagnosis not present

## 2020-03-27 DIAGNOSIS — I10 Essential (primary) hypertension: Secondary | ICD-10-CM | POA: Diagnosis not present

## 2020-03-27 DIAGNOSIS — C50211 Malignant neoplasm of upper-inner quadrant of right female breast: Secondary | ICD-10-CM | POA: Diagnosis not present

## 2020-03-27 DIAGNOSIS — Z170 Estrogen receptor positive status [ER+]: Secondary | ICD-10-CM | POA: Diagnosis not present

## 2020-03-27 DIAGNOSIS — C50911 Malignant neoplasm of unspecified site of right female breast: Secondary | ICD-10-CM | POA: Diagnosis present

## 2020-03-27 HISTORY — PX: BREAST LUMPECTOMY WITH RADIOACTIVE SEED LOCALIZATION: SHX6424

## 2020-03-27 SURGERY — BREAST LUMPECTOMY WITH RADIOACTIVE SEED LOCALIZATION
Anesthesia: General | Site: Breast | Laterality: Right

## 2020-03-27 MED ORDER — PROPOFOL 10 MG/ML IV BOLUS
INTRAVENOUS | Status: DC | PRN
  Administered 2020-03-27: 200 mg via INTRAVENOUS

## 2020-03-27 MED ORDER — 0.9 % SODIUM CHLORIDE (POUR BTL) OPTIME
TOPICAL | Status: DC | PRN
  Administered 2020-03-27: 1000 mL

## 2020-03-27 MED ORDER — PHENYLEPHRINE 40 MCG/ML (10ML) SYRINGE FOR IV PUSH (FOR BLOOD PRESSURE SUPPORT)
PREFILLED_SYRINGE | INTRAVENOUS | Status: AC
  Filled 2020-03-27: qty 10

## 2020-03-27 MED ORDER — DEXAMETHASONE SODIUM PHOSPHATE 10 MG/ML IJ SOLN
INTRAMUSCULAR | Status: AC
  Filled 2020-03-27: qty 1

## 2020-03-27 MED ORDER — EPHEDRINE 5 MG/ML INJ
INTRAVENOUS | Status: AC
  Filled 2020-03-27: qty 10

## 2020-03-27 MED ORDER — HYDROCODONE-ACETAMINOPHEN 5-325 MG PO TABS: 1.0000 | ORAL_TABLET | Freq: Four times a day (QID) | ORAL | 0 refills | Status: DC | PRN

## 2020-03-27 MED ORDER — GABAPENTIN 300 MG PO CAPS
300.0000 mg | ORAL_CAPSULE | ORAL | Status: AC
  Administered 2020-03-27: 300 mg via ORAL
  Filled 2020-03-27: qty 1

## 2020-03-27 MED ORDER — BUPIVACAINE HCL 0.25 % IJ SOLN
INTRAMUSCULAR | Status: DC | PRN
  Administered 2020-03-27: 20 mL

## 2020-03-27 MED ORDER — LACTATED RINGERS IV SOLN: INTRAVENOUS | Status: DC | PRN

## 2020-03-27 MED ORDER — FENTANYL CITRATE (PF) 250 MCG/5ML IJ SOLN
INTRAMUSCULAR | Status: DC | PRN
  Administered 2020-03-27: 50 ug via INTRAVENOUS
  Administered 2020-03-27: 25 ug via INTRAVENOUS

## 2020-03-27 MED ORDER — LIDOCAINE 2% (20 MG/ML) 5 ML SYRINGE
INTRAMUSCULAR | Status: DC | PRN
  Administered 2020-03-27: 60 mg via INTRAVENOUS

## 2020-03-27 MED ORDER — PHENYLEPHRINE 40 MCG/ML (10ML) SYRINGE FOR IV PUSH (FOR BLOOD PRESSURE SUPPORT)
PREFILLED_SYRINGE | INTRAVENOUS | Status: DC | PRN
  Administered 2020-03-27: 40 ug via INTRAVENOUS
  Administered 2020-03-27 (×4): 80 ug via INTRAVENOUS

## 2020-03-27 MED ORDER — PROPOFOL 10 MG/ML IV BOLUS
INTRAVENOUS | Status: AC
  Filled 2020-03-27: qty 20

## 2020-03-27 MED ORDER — EPHEDRINE SULFATE-NACL 50-0.9 MG/10ML-% IV SOSY
PREFILLED_SYRINGE | INTRAVENOUS | Status: DC | PRN
  Administered 2020-03-27 (×3): 10 mg via INTRAVENOUS

## 2020-03-27 MED ORDER — VANCOMYCIN HCL IN DEXTROSE 1-5 GM/200ML-% IV SOLN
1000.0000 mg | INTRAVENOUS | Status: AC
  Administered 2020-03-27: 1000 mg via INTRAVENOUS
  Filled 2020-03-27: qty 200

## 2020-03-27 MED ORDER — MIDAZOLAM HCL 2 MG/2ML IJ SOLN
INTRAMUSCULAR | Status: AC
  Filled 2020-03-27: qty 2

## 2020-03-27 MED ORDER — ACETAMINOPHEN 500 MG PO TABS
1000.0000 mg | ORAL_TABLET | ORAL | Status: AC
  Administered 2020-03-27: 1000 mg via ORAL
  Filled 2020-03-27: qty 2

## 2020-03-27 MED ORDER — CHLORHEXIDINE GLUCONATE CLOTH 2 % EX PADS: 6.0000 | MEDICATED_PAD | Freq: Once | CUTANEOUS | Status: DC

## 2020-03-27 MED ORDER — DEXAMETHASONE SODIUM PHOSPHATE 10 MG/ML IJ SOLN
INTRAMUSCULAR | Status: DC | PRN
  Administered 2020-03-27: 5 mg via INTRAVENOUS

## 2020-03-27 MED ORDER — LIDOCAINE 2% (20 MG/ML) 5 ML SYRINGE
INTRAMUSCULAR | Status: AC
  Filled 2020-03-27: qty 5

## 2020-03-27 MED ORDER — MIDAZOLAM HCL 5 MG/5ML IJ SOLN
INTRAMUSCULAR | Status: DC | PRN
  Administered 2020-03-27: 2 mg via INTRAVENOUS

## 2020-03-27 MED ORDER — BUPIVACAINE HCL (PF) 0.25 % IJ SOLN
INTRAMUSCULAR | Status: AC
  Filled 2020-03-27: qty 30

## 2020-03-27 MED ORDER — ORAL CARE MOUTH RINSE: 15.0000 mL | Freq: Once | OROMUCOSAL | Status: AC

## 2020-03-27 MED ORDER — CELECOXIB 200 MG PO CAPS
200.0000 mg | ORAL_CAPSULE | ORAL | Status: AC
  Administered 2020-03-27: 200 mg via ORAL
  Filled 2020-03-27: qty 1

## 2020-03-27 MED ORDER — FENTANYL CITRATE (PF) 250 MCG/5ML IJ SOLN
INTRAMUSCULAR | Status: AC
  Filled 2020-03-27: qty 5

## 2020-03-27 MED ORDER — ONDANSETRON HCL 4 MG/2ML IJ SOLN
INTRAMUSCULAR | Status: DC | PRN
  Administered 2020-03-27: 4 mg via INTRAVENOUS

## 2020-03-27 MED ORDER — ONDANSETRON HCL 4 MG/2ML IJ SOLN
INTRAMUSCULAR | Status: AC
  Filled 2020-03-27: qty 2

## 2020-03-27 MED ORDER — CHLORHEXIDINE GLUCONATE 0.12 % MT SOLN
15.0000 mL | Freq: Once | OROMUCOSAL | Status: AC
  Administered 2020-03-27: 15 mL via OROMUCOSAL
  Filled 2020-03-27: qty 15

## 2020-03-27 SURGICAL SUPPLY — 35 items
ADH SKN CLS APL DERMABOND .7 (GAUZE/BANDAGES/DRESSINGS) ×1
APL PRP STRL LF DISP 70% ISPRP (MISCELLANEOUS) ×1
APPLIER CLIP 9.375 MED OPEN (MISCELLANEOUS) ×2
APR CLP MED 9.3 20 MLT OPN (MISCELLANEOUS) ×1
BINDER BREAST LRG (GAUZE/BANDAGES/DRESSINGS) IMPLANT
BINDER BREAST XLRG (GAUZE/BANDAGES/DRESSINGS) IMPLANT
CANISTER SUCT 3000ML PPV (MISCELLANEOUS) ×2 IMPLANT
CHLORAPREP W/TINT 26 (MISCELLANEOUS) ×2 IMPLANT
CLIP APPLIE 9.375 MED OPEN (MISCELLANEOUS) IMPLANT
COVER PROBE W GEL 5X96 (DRAPES) ×2 IMPLANT
COVER SURGICAL LIGHT HANDLE (MISCELLANEOUS) ×2 IMPLANT
COVER WAND RF STERILE (DRAPES) IMPLANT
DERMABOND ADVANCED (GAUZE/BANDAGES/DRESSINGS) ×1
DERMABOND ADVANCED .7 DNX12 (GAUZE/BANDAGES/DRESSINGS) ×1 IMPLANT
DEVICE DUBIN SPECIMEN MAMMOGRA (MISCELLANEOUS) ×2 IMPLANT
DRAPE CHEST BREAST 15X10 FENES (DRAPES) ×2 IMPLANT
ELECT COATED BLADE 2.86 ST (ELECTRODE) ×2 IMPLANT
ELECT REM PT RETURN 9FT ADLT (ELECTROSURGICAL) ×2
ELECTRODE REM PT RTRN 9FT ADLT (ELECTROSURGICAL) ×1 IMPLANT
GLOVE BIO SURGEON STRL SZ7.5 (GLOVE) ×4 IMPLANT
GOWN STRL REUS W/ TWL LRG LVL3 (GOWN DISPOSABLE) ×2 IMPLANT
GOWN STRL REUS W/TWL LRG LVL3 (GOWN DISPOSABLE) ×4
KIT BASIN OR (CUSTOM PROCEDURE TRAY) ×2 IMPLANT
KIT MARKER MARGIN INK (KITS) ×2 IMPLANT
LIGHT WAVEGUIDE WIDE FLAT (MISCELLANEOUS) IMPLANT
NDL HYPO 25GX1X1/2 BEV (NEEDLE) ×1 IMPLANT
NEEDLE HYPO 25GX1X1/2 BEV (NEEDLE) ×2 IMPLANT
NS IRRIG 1000ML POUR BTL (IV SOLUTION) ×2 IMPLANT
PACK GENERAL/GYN (CUSTOM PROCEDURE TRAY) ×2 IMPLANT
SUT MNCRL AB 4-0 PS2 18 (SUTURE) ×2 IMPLANT
SUT SILK 2 0 SH (SUTURE) IMPLANT
SUT VIC AB 3-0 SH 18 (SUTURE) ×2 IMPLANT
SYR CONTROL 10ML LL (SYRINGE) ×2 IMPLANT
TOWEL GREEN STERILE (TOWEL DISPOSABLE) ×2 IMPLANT
TOWEL GREEN STERILE FF (TOWEL DISPOSABLE) ×2 IMPLANT

## 2020-03-27 NOTE — Op Note (Signed)
03/27/2020  2:16 PM  PATIENT:  Judy Chapman  74 y.o. female  PRE-OPERATIVE DIAGNOSIS:  RIGHT BREAST CANCER  POST-OPERATIVE DIAGNOSIS:  RIGHT BREAST CANCER  PROCEDURE:  Procedure(s): RIGHT BREAST LUMPECTOMY WITH RADIOACTIVE SEED LOCALIZATION (Right)  SURGEON:  Surgeon(s) and Role:    * Jovita Kussmaul, MD - Primary  PHYSICIAN ASSISTANT:   ASSISTANTS: none   ANESTHESIA:   local and general  EBL:  minimal   BLOOD ADMINISTERED:none  DRAINS: none   LOCAL MEDICATIONS USED:  MARCAINE     SPECIMEN:  Source of Specimen:  right breast tissue with additional superior margin  DISPOSITION OF SPECIMEN:  PATHOLOGY  COUNTS:  YES  TOURNIQUET:  * No tourniquets in log *  DICTATION: .Dragon Dictation   After informed consent was obtained the patient was brought to the operating room and placed in the supine position on the operating table.  After adequate induction of general anesthesia the patient's right breast was prepped with ChloraPrep, allowed to dry, and draped in usual sterile manner.  The pacemaker was prepped out of the field in a magnet was placed on the pacer.  An appropriate timeout was performed.  The patient had a good heart rate and blood pressure underneath the pacer.  Previously an I-125 seed was placed in the upper portion of the right breast to mark an area of invasive breast cancer.  The neoprobe was set to I-125 in the area of radioactivity was readily identified.  The area around this was infiltrated with quarter percent Marcaine.  An elliptical incision was made in the skin overlying the area of radioactivity with a 15 blade knife.  The incision was carried through the skin and subcutaneous tissue sharply with the electrocautery.  Dissection was then carried around the radioactive seed while checking the area of radioactivity frequently.  The dissection was carried very close to the chest wall.  Once the specimen was removed it was oriented with the appropriate paint  colors.  A specimen radiograph was obtained that showed the clip and seed to be near the center of the specimen.  The signal of the seed was a little bit close on the superior edge so an additional superior edge was removed sharply with the electrocautery and marked appropriately.  Both of the specimens were then sent to pathology for further evaluation.  Hemostasis was achieved using the Bovie electrocautery and clips on vessels.  The wound was then irrigated with copious amounts of saline.  The deep layer of the wound was then closed with layers of interrupted 3-0 Vicryl stitches.  The skin was then closed with a running 4-0 Monocryl subcuticular stitch.  Dermabond dressings were applied.  The patient tolerated the procedure well.  At the end of the case all needle sponge and instrument counts were correct.  The patient was then awakened and taken to recovery in stable condition.  PLAN OF CARE: Discharge to home after PACU  PATIENT DISPOSITION:  PACU - hemodynamically stable.   Delay start of Pharmacological VTE agent (>24hrs) due to surgical blood loss or risk of bleeding: not applicable

## 2020-03-27 NOTE — Anesthesia Procedure Notes (Signed)
Procedure Name: LMA Insertion Date/Time: 03/27/2020 1:29 PM Performed by: Michele Rockers, CRNA Pre-anesthesia Checklist: Patient identified, Emergency Drugs available, Suction available and Patient being monitored Patient Re-evaluated:Patient Re-evaluated prior to induction Oxygen Delivery Method: Circle system utilized Preoxygenation: Pre-oxygenation with 100% oxygen Induction Type: IV induction Ventilation: Mask ventilation without difficulty LMA: LMA inserted LMA Size: 4.0 Number of attempts: 1 Placement Confirmation: positive ETCO2 and breath sounds checked- equal and bilateral Tube secured with: Tape Dental Injury: Teeth and Oropharynx as per pre-operative assessment

## 2020-03-27 NOTE — H&P (Signed)
Judy Chapman  Location: [facility] Patient #: [id number] DOB: Jun 29, 1946 Undefined / Language: Cleophus Molt / Race: White Female   History of Present Illness The patient is a 74 year old female who presents with breast cancer.The patient is a 74 year old white female who presents with a mass on screening mammogram in the 1 o clock position of the right breast. This measured 55m and the axilla looked neg. 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 she does not smoke. she has no family history of breast cancer. She does have a pacemaker for a conduction issue but no h/o MI   Past Surgical History Breast Biopsy  Right. Hysterectomy (not due to cancer) - Partial   Diagnostic Studies History Colonoscopy  1-5 years ago Mammogram  within last year  Social History Alcohol use  Moderate alcohol use. Caffeine use  Coffee. No drug use  Tobacco use  Former smoker.  Family History  Alcohol Abuse  Father.  Pregnancy / Birth History  Age at menarche  89years. Contraceptive History  Oral contraceptives. Maternal age  76-35Para  2 Regular periods   Other Problems  Arthritis  Breast Cancer  High blood pressure  Other disease, cancer, significant illness     Review of Systems  General Not Present- Appetite Loss, Chills, Fatigue, Fever, Night Sweats, Weight Gain and Weight Loss. Skin Not Present- Change in Wart/Mole, Dryness, Hives, Jaundice, New Lesions, Non-Healing Wounds, Rash and Ulcer. HEENT Present- Wears glasses/contact lenses. Not Present- Earache, Hearing Loss, Hoarseness, Nose Bleed, Oral Ulcers, Ringing in the Ears, Seasonal Allergies, Sinus Pain, Sore Throat, Visual Disturbances and Yellow Eyes. Respiratory Not Present- Bloody sputum, Chronic Cough, Difficulty Breathing, Snoring and Wheezing. Breast Not Present- Breast Mass, Breast Pain, Nipple Discharge and Skin Changes. Cardiovascular  Not Present- Chest Pain, Difficulty Breathing Lying Down, Leg Cramps, Palpitations, Rapid Heart Rate, Shortness of Breath and Swelling of Extremities. Gastrointestinal Not Present- Abdominal Pain, Bloating, Bloody Stool, Change in Bowel Habits, Chronic diarrhea, Constipation, Difficulty Swallowing, Excessive gas, Gets full quickly at meals, Hemorrhoids, Indigestion, Nausea, Rectal Pain and Vomiting. Female Genitourinary Not Present- Frequency, Nocturia, Painful Urination, Pelvic Pain and Urgency. Musculoskeletal Present- Joint Pain. Not Present- Back Pain, Joint Stiffness, Muscle Pain, Muscle Weakness and Swelling of Extremities. Neurological Not Present- Decreased Memory, Fainting, Headaches, Numbness, Seizures, Tingling, Tremor, Trouble walking and Weakness. Psychiatric Not Present- Anxiety, Bipolar, Change in Sleep Pattern, Depression, Fearful and Frequent crying. Hematology Present- Easy Bruising. Not Present- Blood Thinners, Excessive bleeding, Gland problems, HIV and Persistent Infections.   Physical Exam General Mental Status-Alert. General Appearance-Consistent with stated age. Hydration-Well hydrated. Voice-Normal.  Head and Neck Head-normocephalic, atraumatic with no lesions or palpable masses. Trachea-midline. Thyroid Gland Characteristics - normal size and consistency.  Eye Eyeball - Bilateral-Extraocular movements intact. Sclera/Conjunctiva - Bilateral-No scleral icterus.  Chest and Lung Exam Chest and lung exam reveals -quiet, even and easy respiratory effort with no use of accessory muscles and on auscultation, normal breath sounds, no adventitious sounds and normal vocal resonance. Inspection Chest Wall - Normal. Back - normal.  Breast Note: There is no palpable mass in either breast. There is no palpable axillary, supraclavicular, or cervical lymphadenopathy. There is a pacemaker on the right upper chest wall   Cardiovascular Cardiovascular  examination reveals -normal heart sounds, regular rate and rhythm with no murmurs and normal pedal pulses bilaterally.  Abdomen Inspection Inspection  of the abdomen reveals - No Hernias. Skin - Scar - no surgical scars. Palpation/Percussion Palpation and Percussion of the abdomen reveal - Soft, Non Tender, No Rebound tenderness, No Rigidity (guarding) and No hepatosplenomegaly. Auscultation Auscultation of the abdomen reveals - Bowel sounds normal.  Neurologic Neurologic evaluation reveals -alert and oriented x 3 with no impairment of recent or remote memory. Mental Status-Normal.  Musculoskeletal Normal Exam - Left-Upper Extremity Strength Normal and Lower Extremity Strength Normal. Normal Exam - Right-Upper Extremity Strength Normal and Lower Extremity Strength Normal.  Lymphatic Head & Neck  General Head & Neck Lymphatics: Bilateral - Description - Normal. Axillary  General Axillary Region: Bilateral - Description - Normal. Tenderness - Non Tender. Femoral & Inguinal  Generalized Femoral & Inguinal Lymphatics: Bilateral - Description - Normal. Tenderness - Non Tender.    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 I have discussed with her in detail the different options for treatment and at this point she favors breast conservation which I think is a very reasonable way of treating her cancer. I have discussed with her the risks and benefits of the surgery as well as some of the technical aspects including the use of a radioactive seed for localization and she understands and wishes to proceed. She will meet with medical and radiation oncology to discuss adjuvant therapy. This patient encounter took 60 minutes today to perform the following: take history, perform exam, review outside records,  interpret imaging, counsel the patient on their diagnosis and document encounter, findings & plan in the EHR

## 2020-03-27 NOTE — Interval H&P Note (Signed)
History and Physical Interval Note:  03/27/2020 12:41 PM  Judy Chapman  has presented today for surgery, with the diagnosis of RIGHT BREAST CANCER.  The various methods of treatment have been discussed with the patient and family. After consideration of risks, benefits and other options for treatment, the patient has consented to  Procedure(s): RIGHT BREAST LUMPECTOMY WITH RADIOACTIVE SEED LOCALIZATION (Right) as a surgical intervention.  The patient's history has been reviewed, patient examined, no change in status, stable for surgery.  I have reviewed the patient's chart and labs.  Questions were answered to the patient's satisfaction.     Autumn Messing III

## 2020-03-27 NOTE — Anesthesia Postprocedure Evaluation (Signed)
Anesthesia Post Note  Patient: Judy Chapman  Procedure(s) Performed: RIGHT BREAST LUMPECTOMY WITH RADIOACTIVE SEED LOCALIZATION (Right Breast)     Patient location during evaluation: Phase II Anesthesia Type: General Level of consciousness: awake Pain management: pain level controlled Vital Signs Assessment: post-procedure vital signs reviewed and stable Respiratory status: spontaneous breathing Cardiovascular status: stable Postop Assessment: no apparent nausea or vomiting Anesthetic complications: no    Last Vitals:  Vitals:   03/27/20 1530 03/27/20 1535  BP: 116/73 118/77  Pulse: 62 66  Resp: 20 19  Temp:  36.5 C  SpO2: 99% 100%    Last Pain:  Vitals:   03/27/20 1535  TempSrc:   PainSc: 0-No pain   Pain Goal:                   Huston Foley

## 2020-03-27 NOTE — Transfer of Care (Signed)
Immediate Anesthesia Transfer of Care Note  Patient: Judy Chapman  Procedure(s) Performed: RIGHT BREAST LUMPECTOMY WITH RADIOACTIVE SEED LOCALIZATION (Right Breast)  Patient Location: PACU  Anesthesia Type:General  Level of Consciousness: drowsy, patient cooperative and responds to stimulation  Airway & Oxygen Therapy: Patient Spontanous Breathing and Patient connected to face mask oxygen  Post-op Assessment: Report given to RN, Post -op Vital signs reviewed and stable and Patient moving all extremities  Post vital signs: Reviewed and stable  Last Vitals:  Vitals Value Taken Time  BP 132/64 03/27/20 1427  Temp    Pulse 60 03/27/20 1429  Resp 15 03/27/20 1429  SpO2 100 % 03/27/20 1429  Vitals shown include unvalidated device data.  Last Pain:  Vitals:   03/27/20 1112  TempSrc:   PainSc: 0-No pain         Complications: No apparent anesthesia complications

## 2020-03-29 ENCOUNTER — Telehealth: Payer: Self-pay | Admitting: *Deleted

## 2020-03-29 ENCOUNTER — Encounter: Payer: Self-pay | Admitting: *Deleted

## 2020-03-29 LAB — SURGICAL PATHOLOGY

## 2020-03-29 NOTE — Telephone Encounter (Signed)
Spoke to pt concerning [place] from 5.26.21. Denies questions or concern regarding dx or treatment care plan. Relate doing well after sx, "only sore when I don't wear a bra". Encourage pt to wear supportive bra to reduce soreness and to call with needs. Received verbal understanding.

## 2020-04-01 ENCOUNTER — Encounter: Payer: Self-pay | Admitting: *Deleted

## 2020-04-03 NOTE — Progress Notes (Signed)
Nutrition  Patient identified by attending Breast Clinic on 03/20/20.   Chart reviewed.  Patient s/p right lumpectomy on 6/2.  Planning antiestrogens and no radiation.    Ht: 64.5 inches Wt: 175 lb  BMI: 29  Stable weight  Patient was provided nutrition packet with RD's contact information at clinic.  Patient is not currently at nutritional risk.  Please consult RD if nutritional concerns arise.   Judy Chapman, [place], [place] Registered Dietitian [phone] (pager)

## 2020-04-10 ENCOUNTER — Other Ambulatory Visit: Payer: Self-pay | Admitting: *Deleted

## 2020-04-10 DIAGNOSIS — C50211 Malignant neoplasm of upper-inner quadrant of right female breast: Secondary | ICD-10-CM

## 2020-04-10 DIAGNOSIS — Z170 Estrogen receptor positive status [ER+]: Secondary | ICD-10-CM

## 2020-04-17 NOTE — Progress Notes (Signed)
[place]  [phone] [phone]     ID: Judy Chapman DOB: 1946-08-08  MR#: [id number]  CHE#:[id number]  Patient Care Team: Crist Infante, MD as PCP - General (Internal Medicine) Deboraha Sprang, MD as PCP - Cardiology (Cardiology) Rockwell Germany, RN as Oncology Nurse Navigator Mauro Kaufmann, RN as Oncology Nurse Navigator Jovita Kussmaul, MD as Consulting Physician (General Surgery) Jamie-Lee Galdamez, Virgie Dad, MD as Consulting Physician (Oncology) Eppie Gibson, MD as Attending Physician (Radiation Oncology) Deboraha Sprang, MD as Consulting Physician (Cardiology) Chauncey Cruel, MD OTHER MD:  I connected with Judy Chapman on 04/18/20 at  8:30 AM EDT by video enabled telemedicine visit and verified that I am speaking with the correct person using two identifiers.   I discussed the limitations, risks, security and privacy concerns of performing an evaluation and management service by telemedicine and the availability of in-person appointments. I also discussed with the patient that there may be a patient responsible charge related to this service. The patient expressed understanding and agreed to proceed.   Other persons participating in the visit and their role in the encounter: none  Patient's location: home  Provider's location: [place]: estrogen receptor positive breast cancer  CURRENT TREATMENT:    INTERVAL HISTORY: Judy Chapman was contacted today for follow up of her estrogen receptor positive breast cancer. She was evaluated in the multidisciplinary breast cancer clinic on 03/20/2020.  She opted to proceed with right lumpectomy on 03/27/2020 under Dr. Marlou Starks. Pathology from the procedure [id number]) showed: invasive lobular carcinoma, grade 2, 0.9 cm; lobular carcinoma in situ, margins uninvolved.   REVIEW OF SYSTEMS: Judy Chapman had some problems with anesthesia, feeling kind of foggy headed.   That has cleared.  She is back to her normal activities and feels "like myself again".  She has had no bleeding no unusual pain and no fever.  She is ready to start antiestrogens.   HISTORY OF CURRENT ILLNESS: From the original intake note:  Judy Chapman had routine screening mammography on 02/29/2020 showing a possible abnormality in the right breast. She underwent right diagnostic mammography with tomography and right breast ultrasonography at [facility] on 03/08/2020 showing: breast density category B; suspicious 8 mm mass in right breast at 1 o'clock; right axilla unremarkable.  Accordingly on 03/11/2020 she proceeded to biopsy of the right breast area in question. The pathology from this procedure ([id number]) showed: invasive and in situ mammary carcinoma, grade 2, e-cadherin negative. Prognostic indicators significant for: estrogen receptor, 90% positive and progesterone receptor, 95% positive, both with strong staining intensity. Proliferation marker Ki67 at 10%. HER2 negative by immunohistochemistry (1+).  The patient's subsequent history is as detailed below.   PAST MEDICAL HISTORY: Past Medical History:  Diagnosis Date  . ACE inhibitor intolerance   . Arthritis   . Asthma    outgrew as a child  . Cancer [facility])    right breast  . Early cataract   . Endometriosis   . Gout   . Heart disease   . Hypertension   . Osteopenia   . Pacemaker-MDT    DOI-2002, Generator change 2010  . Pneumonia   . PVC (premature ventricular contraction)   . Sinus node dysfunction (HCC)   . Wears glasses     PAST SURGICAL HISTORY: Past Surgical History:  Procedure Laterality Date  . BREAST LUMPECTOMY     Benign  . BREAST LUMPECTOMY WITH RADIOACTIVE SEED  LOCALIZATION Right 03/27/2020   Procedure: RIGHT BREAST LUMPECTOMY WITH RADIOACTIVE SEED LOCALIZATION;  Surgeon: Griselda Miner, MD;  Location: [facility] OR;  Service: General;  Laterality: Right;  . CORONARY ANGIOPLASTY    . DILATION AND  CURETTAGE OF UTERUS    . GALLBLADDER SURGERY    . HYSTEROSCOPY    . PACEMAKER INSERTION     [employer]  . TONSILLECTOMY    . TUBAL LIGATION    . VAGINAL HYSTERECTOMY    . WISDOM TOOTH EXTRACTION      FAMILY HISTORY: Family History  Problem Relation Age of Onset  . Stomach cancer Paternal Grandfather 28  . Hypertension Mother   . Arrhythmia Neg Hx   . Clotting disorder Neg Hx   . Heart attack Neg Hx   . Fainting Neg Hx   . Anemia Neg Hx   . Heart disease Neg Hx   . Heart failure Neg Hx   . Hyperlipidemia Neg Hx   . Colon cancer Neg Hx    Her father died at age 60 from cirrhosis of the liver. Her mother died at age 65 from pneumonia/sepsis. Judy Chapman has 1 brother. She reports stomach cancer in her paternal grandfather.  There is no other history of breast ovarian or prostate cancer in the family.   GYNECOLOGIC HISTORY:  No LMP recorded. Patient has had a hysterectomy. Menarche: 74 years old Age at first live birth: 74 years old GX P 2 LMP prior to hysterectomy HRT used estradiol pill until breast cancer diagnosis (mid May 2021) Hysterectomy? Yes, in her early 41's BSO? 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 Husband Gerlene Burdock is an Therapist, nutritional and works in [employer]. She lives at home with her husband. Daughter Benancio Deeds, age 59, works as an Wellsite geologist here in [place]. Daughter Mary Sella, age 16, is a Corporate investment banker with [employer] (an investment firm) in [place], [place]. Imajean has 4 grandchildren. She is a International aid/development worker.    ADVANCED DIRECTIVES: In the absence of any documentation to the contrary, the patient's spouse is their HCPOA.    HEALTH MAINTENANCE: Social History   Tobacco Use  . Smoking status: Former Smoker    Packs/day: 0.30    Years: 10.00    Pack years: 3.00    Types: Cigarettes    Quit date: 10/26/2005    Years since quitting: 14.4  . Smokeless tobacco: Never Used  Vaping  Use  . Vaping Use: Never used  Substance Use Topics  . Alcohol use: Yes    Alcohol/week: 10.0 standard drinks    Types: 10 Glasses of wine per week    Comment: 2 glasses of wine daily  . Drug use: No     Colonoscopy: 10/2012 (Dr. Arlyce Dice)  PAP: 2015  Bone density: 2019   Allergies  Allergen Reactions  . Augmentin [Amoxicillin-Pot Clavulanate]     diarrhea  . Macrodantin [Nitrofurantoin] Other (See Comments)    Sweating and chills  . Ramipril Hives and Swelling         Current Outpatient Medications  Medication Sig Dispense Refill  . allopurinol (ZYLOPRIM) 100 MG tablet Take 200 mg by mouth daily.    . Cholecalciferol (VITAMIN D PO) Take 2,000 Units by mouth daily.     . citalopram (CELEXA) 20 MG tablet Take 1 tablet (20 mg total) by mouth daily. 90 tablet 2  . HYDROcodone-acetaminophen (NORCO/VICODIN) 5-325 MG tablet Take 1-2 tablets by  mouth every 6 (six) hours as needed for moderate pain or severe pain. 15 tablet 0  . nebivolol (BYSTOLIC) 10 MG tablet Take 10 mg by mouth daily.     No current facility-administered medications for this visit.    OBJECTIVE: White woman who appears well  There were no vitals filed for this visit.   There is no height or weight on file to calculate BMI.   Wt Readings from Last 3 Encounters:  03/27/20 175 lb (79.4 kg)  03/26/20 177 lb 8 oz (80.5 kg)  03/20/20 176 lb 14.4 oz (80.2 kg)      ECOG FS:1 - Symptomatic but completely ambulatory  Telemedicine visit 04/18/2020    LAB RESULTS:  CMP     Component Value Date/Time   NA 143 03/20/2020 0827   K 4.0 03/20/2020 0827   CL 108 03/20/2020 0827   CO2 27 03/20/2020 0827   GLUCOSE 94 03/20/2020 0827   BUN 22 03/20/2020 0827   CREATININE 0.76 03/20/2020 0827   CALCIUM 8.4 (L) 03/20/2020 0827   PROT 6.6 03/20/2020 0827   ALBUMIN 3.6 03/20/2020 0827   AST 20 03/20/2020 0827   ALT 20 03/20/2020 0827   ALKPHOS 53 03/20/2020 0827   BILITOT 0.6 03/20/2020 0827   GFRNONAA >60  03/20/2020 0827   GFRAA >60 03/20/2020 0827    No results found for: TOTALPROTELP, ALBUMINELP, A1GS, A2GS, BETS, BETA2SER, GAMS, MSPIKE, SPEI  Lab Results  Component Value Date   WBC 5.0 03/20/2020   NEUTROABS 2.8 03/20/2020   HGB 13.6 03/20/2020   HCT 40.8 03/20/2020   MCV 97.4 03/20/2020   PLT 204 03/20/2020    No results found for: LABCA2  No components found for: [id number]  No results for input(s): INR in the last 168 hours.  No results found for: LABCA2  No results found for: YIF027  No results found for: XAJ287  No results found for: OMV672  No results found for: CA2729  No components found for: HGQUANT  No results found for: CEA1 / No results found for: CEA1   No results found for: AFPTUMOR  No results found for: CHROMOGRNA  No results found for: KPAFRELGTCHN, LAMBDASER, KAPLAMBRATIO (kappa/lambda light chains)  No results found for: HGBA, HGBA2QUANT, HGBFQUANT, HGBSQUAN (Hemoglobinopathy evaluation)   No results found for: LDH  No results found for: IRON, TIBC, IRONPCTSAT (Iron and TIBC)  No results found for: FERRITIN  Urinalysis    Component Value Date/Time   COLORURINE YELLOW 03/30/2014 1543   APPEARANCEUR CLEAR 03/30/2014 1543   LABSPEC 1.020 03/30/2014 1543   PHURINE 6.5 03/30/2014 1543   GLUCOSEU NEG 03/30/2014 1543   HGBUR TRACE (A) 03/30/2014 1543   BILIRUBINUR NEG 03/30/2014 1543   KETONESUR NEG 03/30/2014 1543   PROTEINUR NEG 03/30/2014 1543   UROBILINOGEN 0.2 03/30/2014 1543   NITRITE NEG 03/30/2014 1543   LEUKOCYTESUR NEG 03/30/2014 1543     STUDIES: MM Breast Surgical Specimen  Addendum Date: 03/28/2020   ADDENDUM REPORT: 03/27/2020 14:07 ADDENDUM: The radioactive seed localization report was inadvertently dictated on this breast surgical specimen order. The correct dictation is as follows: CLINICAL DATA:  Evaluate surgical specimen following RIGHT lumpectomy for breast cancer EXAM: SPECIMEN RADIOGRAPH OF THE RIGHT BREAST  COMPARISON:  Previous exam(s). FINDINGS: Status post excision of the RIGHT breast. The radioactive seed and biopsy marker clip are present, completely intact, and were marked for pathology. IMPRESSION: Specimen radiograph of the RIGHT breast. Electronically Signed   By: Cleatis Polka.D.  On: 03/27/2020 14:07   Result Date: 03/27/2020 CLINICAL DATA:  74 year old female for radioactive seed localization of RIGHT breast cancer. EXAM: MAMMOGRAPHIC GUIDED RADIOACTIVE SEED LOCALIZATION OF THE RIGHT BREAST COMPARISON:  Previous exam(s). FINDINGS: Patient presents for radioactive seed localization prior to RIGHT lumpectomy. I met with the patient and we discussed the procedure of seed localization including benefits and alternatives. We discussed the high likelihood of a successful procedure. We discussed the risks of the procedure including infection, bleeding, tissue injury and further surgery. We discussed the low dose of radioactivity involved in the procedure. Informed, written consent was given. The usual time-out protocol was performed immediately prior to the procedure. Using mammographic guidance, sterile technique, 1% lidocaine and an I-125 radioactive seed, the RIBBON clip was localized using a SUPERIOR approach. The follow-up mammogram images confirm the seed in the expected location and were marked for Dr. Marlou Starks. Follow-up survey of the patient confirms presence of the radioactive seed. Order number of I-125 seed:  [id number]. Total activity:  0.932 millicuries.  Reference Date: 03/21/2020. The patient tolerated the procedure well and was released from the [facility]. She was given instructions regarding seed removal. IMPRESSION: Radioactive seed localization RIGHT breast. No apparent complications. Electronically Signed: By: Margarette Canada M.D. On: 03/27/2020 10:37   MM RT RADIOACTIVE SEED LOC MAMMO GUIDE  Result Date: 03/27/2020 CLINICAL DATA:  74 year old female for radioactive seed localization of RIGHT  breast cancer. EXAM: MAMMOGRAPHIC GUIDED RADIOACTIVE SEED LOCALIZATION OF THE RIGHT BREAST COMPARISON:  Previous exam(s). FINDINGS: Patient presents for radioactive seed localization prior to RIGHT lumpectomy. I met with the patient and we discussed the procedure of seed localization including benefits and alternatives. We discussed the high likelihood of a successful procedure. We discussed the risks of the procedure including infection, bleeding, tissue injury and further surgery. We discussed the low dose of radioactivity involved in the procedure. Informed, written consent was given. The usual time-out protocol was performed immediately prior to the procedure. Using mammographic guidance, sterile technique, 1% lidocaine and an I-125 radioactive seed, the RIBBON clip was localized using a SUPERIOR approach. The follow-up mammogram images confirm the seed in the expected location and were marked for Dr. Marlou Starks. Follow-up survey of the patient confirms presence of the radioactive seed. Order number of I-125 seed:  [id number]. Total activity:  And 5.427 millicuries.  Reference Date: 03/21/2020. The patient tolerated the procedure well and was released from the [facility]. She was given instructions regarding seed removal. IMPRESSION: Radioactive seed localization RIGHT breast. No apparent complications. Electronically Signed   By: Margarette Canada M.D.   On: 03/27/2020 14:06     ELIGIBLE FOR AVAILABLE RESEARCH PROTOCOL: AET  ASSESSMENT: 74 y.o. [place] woman status post right lumpectomy without sentinel lymph node sampling 03/27/2020 for a pT1b cN0, stage IA invasive lobular breast cancer, estrogen and progesterone receptor positive, HER-2 not amplified, with an MIB-1 of 10%.  (1) adjuvant radiation may be safely omitted if the patient takes an antiestrogen for 5 years  (2) tamoxifen started 04/18/2020  (a) status post hysterectomy  PLAN: Sanaz did remarkably well with her surgery, aside from a little  bit of mental fog as she reported and that has cleared.  She is now ready to start antiestrogens.  We discussed the difference between tamoxifen and anastrozole in detail. She understands that anastrozole and the aromatase inhibitors in general work by blocking estrogen production. Accordingly vaginal dryness, decrease in bone density, and of course hot flashes can result. The aromatase inhibitors can  also negatively affect the cholesterol profile, although that is a minor effect. One out of 5 women on aromatase inhibitors we will feel "old and achy". This arthralgia/myalgia syndrome, which resembles fibromyalgia clinically, does resolve with stopping the medications. Accordingly this is not a reason to not try an aromatase inhibitor but it is a frequent reason to stop it (in other words 20% of women will not be able to tolerate these medications).  Tamoxifen on the other hand does not block estrogen production. It does not "take away a woman's estrogen". It blocks the estrogen receptor in breast cells. Like anastrozole, it can also cause hot flashes. As opposed to anastrozole, tamoxifen has many estrogen-like effects. It is technically an estrogen receptor modulator. This means that in some tissues tamoxifen works like estrogen-- for example it helps strengthen the bones. It tends to improve the cholesterol profile. It can cause thickening of the endometrial lining, and even endometrial polyps or rarely cancer of the uterus.(The risk of uterine cancer due to tamoxifen is one additional cancer per thousand women year). It can cause vaginal wetness or stickiness. It can cause blood clots through this estrogen-like effect--the risk of blood clots with tamoxifen is exactly the same as with birth control pills or hormone replacement.  Neither of these agents causes mood changes or weight gain, despite the popular belief that they can have these side effects. We have data from studies comparing either of these  drugs with placebo, and in those cases the control group had the same amount of weight gain and depression as the group that took the drug.  I think she would be a particularly good candidate for tamoxifen being status post hysterectomy and having had no clotting problems from prior estrogen use.  She has a good understanding of the possible toxicities side effects and complications and have gone ahead and placed the prescription.  She will see me by video conference in about 6 weeks just to make sure everything is going well and then in person in about 3 months  She knows to call for any other issue that may develop before then  Judy Jews C. Elly Haffey, MD 04/18/2020 8:48 AM Medical Oncology and Hematology [facility] [place], [place] [address] Tel. [phone]    Fax. [phone]   This document serves as a record of services personally performed by Lurline Del, MD. It was created on his behalf by Wilburn Mylar, a trained medical scribe. The creation of this record is based on the scribe's personal observations and the provider's statements to them.   I, Lurline Del MD, have reviewed the above documentation for accuracy and completeness, and I agree with the above.   *Total Encounter Time as defined by the Centers for Medicare and Medicaid Services includes, in addition to the face-to-face time of a patient visit (documented in the note above) non-face-to-face time: obtaining and reviewing outside history, ordering and reviewing medications, tests or procedures, care coordination (communications with other health care professionals or caregivers) and documentation in the medical record.

## 2020-04-18 ENCOUNTER — Inpatient Hospital Stay: Payer: Medicare PPO | Attending: Oncology | Admitting: Oncology

## 2020-04-18 DIAGNOSIS — Z170 Estrogen receptor positive status [ER+]: Secondary | ICD-10-CM | POA: Diagnosis not present

## 2020-04-18 DIAGNOSIS — C50211 Malignant neoplasm of upper-inner quadrant of right female breast: Secondary | ICD-10-CM

## 2020-04-18 MED ORDER — TAMOXIFEN CITRATE 20 MG PO TABS
20.0000 mg | ORAL_TABLET | Freq: Every day | ORAL | 4 refills | Status: AC
Start: 2020-04-18 — End: 2020-05-18

## 2020-04-19 ENCOUNTER — Encounter: Payer: Self-pay | Admitting: *Deleted

## 2020-04-19 ENCOUNTER — Telehealth: Payer: Self-pay | Admitting: *Deleted

## 2020-04-19 NOTE — Telephone Encounter (Signed)
Per Iris Pert, referral was canceled.

## 2020-04-22 ENCOUNTER — Telehealth: Payer: Self-pay | Admitting: Oncology

## 2020-04-22 ENCOUNTER — Ambulatory Visit (INDEPENDENT_AMBULATORY_CARE_PROVIDER_SITE_OTHER): Payer: Medicare PPO | Admitting: *Deleted

## 2020-04-22 DIAGNOSIS — I495 Sick sinus syndrome: Secondary | ICD-10-CM

## 2020-04-22 NOTE — Telephone Encounter (Signed)
Scheduled appts per 6/25 los. Pt confirmed appt dates and times.

## 2020-04-23 LAB — CUP PACEART REMOTE DEVICE CHECK
Battery Impedance: 1264 Ohm
Battery Remaining Longevity: 57 mo
Battery Voltage: 2.77 V
Brady Statistic AP VP Percent: 0 %
Brady Statistic AP VS Percent: 52 %
Brady Statistic AS VP Percent: 0 %
Brady Statistic AS VS Percent: 47 %
Date Time Interrogation Session: 20210628094654
Implantable Lead Implant Date: 20020701
Implantable Lead Implant Date: 20020701
Implantable Lead Location: 753859
Implantable Lead Location: 753860
Implantable Lead Model: 4469
Implantable Lead Model: 4470
Implantable Lead Serial Number: 313853
Implantable Lead Serial Number: 323286
Implantable Pulse Generator Implant Date: 20100908
Lead Channel Impedance Value: 466 Ohm
Lead Channel Impedance Value: 574 Ohm
Lead Channel Pacing Threshold Amplitude: 0.625 V
Lead Channel Pacing Threshold Amplitude: 0.75 V
Lead Channel Pacing Threshold Pulse Width: 0.4 ms
Lead Channel Pacing Threshold Pulse Width: 0.4 ms
Lead Channel Setting Pacing Amplitude: 2 V
Lead Channel Setting Pacing Amplitude: 2.5 V
Lead Channel Setting Pacing Pulse Width: 0.4 ms
Lead Channel Setting Sensing Sensitivity: 2 mV

## 2020-04-24 NOTE — Progress Notes (Signed)
Remote pacemaker transmission.   

## 2020-06-02 NOTE — Progress Notes (Signed)
[place]  Telephone:([phone] Fax:([phone]) [phone]     ID: Judy Chapman DOB: [birth year]  MR#: [id number]  HBZ#:[id number]  Patient Care Team: Crist Infante, MD as PCP - General (Internal Medicine) Deboraha Sprang, MD as PCP - Cardiology (Cardiology) Rockwell Germany, RN as Oncology Nurse Navigator Mauro Kaufmann, RN as Oncology Nurse Navigator Jovita Kussmaul, MD as Consulting Physician (General Surgery) Mica Releford, Virgie Dad, MD as Consulting Physician (Oncology) Eppie Gibson, MD as Attending Physician (Radiation Oncology) Deboraha Sprang, MD as Consulting Physician (Cardiology) Chauncey Cruel, MD OTHER MD:  I connected with Judy Chapman on 06/03/20 at 11:00 AM EDT by video enabled telemedicine visit and verified that I am speaking with the correct person using two identifiers.   I discussed the limitations, risks, security and privacy concerns of performing an evaluation and management service by telemedicine and the availability of in-person appointments. I also discussed with the patient that there may be a patient responsible charge related to this service. The patient expressed understanding and agreed to proceed.   Other persons participating in the visit and their role in the encounter: none  Patients location: home  Providers location: [place]: estrogen receptor positive breast cancer  CURRENT TREATMENT: tamoxifen   INTERVAL HISTORY: Judy Chapman was contacted today for follow up of her estrogen receptor positive breast cancer.   She started tamoxifen at her last visit on 04/18/2020.  She is having some nighttime hot flashes which can briefly wake her up.  She does not have them during the day.  She does not have significant problems with vaginal wetness.  Overall she is tolerating the medication quite well   REVIEW OF SYSTEMS: Judy Chapman tells me in the morning sometimes she has thick mucus in her throat.   She also has arthritis symptoms.  These do not seem to be any different from baseline.  For exercise she is doing some walking, approximately two thirds of a mile by her account.  Of course she also does her housework.  A detailed review of systems today was otherwise stable   HISTORY OF CURRENT ILLNESS: From the original intake note:  Judy Chapman had routine screening mammography on 02/29/2020 showing a possible abnormality in the right breast. She underwent right diagnostic mammography with tomography and right breast ultrasonography at [facility] on 03/08/2020 showing: breast density category B; suspicious 8 mm mass in right breast at 1 o'clock; right axilla unremarkable.  Accordingly on 03/11/2020 she proceeded to biopsy of the right breast area in question. The pathology from this procedure ([id number]) showed: invasive and in situ mammary carcinoma, grade 2, e-cadherin negative. Prognostic indicators significant for: estrogen receptor, 90% positive and progesterone receptor, 95% positive, both with strong staining intensity. Proliferation marker Ki67 at 10%. HER2 negative by immunohistochemistry (1+).  The patient's subsequent history is as detailed below.   PAST MEDICAL HISTORY: Past Medical History:  Diagnosis Date   ACE inhibitor intolerance    Arthritis    Asthma    outgrew as a child   Cancer ([place])    right breast   Early cataract    Endometriosis    Gout    Heart disease    Hypertension    Osteopenia    Pacemaker-MDT    DOI-2002, Generator change 2010   Pneumonia    PVC (premature ventricular contraction)    Sinus node dysfunction (HCC)    Wears glasses  PAST SURGICAL HISTORY: Past Surgical History:  Procedure Laterality Date   BREAST LUMPECTOMY     Benign   BREAST LUMPECTOMY WITH RADIOACTIVE SEED LOCALIZATION Right 03/27/2020   Procedure: RIGHT BREAST LUMPECTOMY WITH RADIOACTIVE SEED LOCALIZATION;  Surgeon: Autumn Messing III, MD;   Location: [facility];  Service: General;  Laterality: Right;   CORONARY ANGIOPLASTY     DILATION AND CURETTAGE OF UTERUS     GALLBLADDER SURGERY     HYSTEROSCOPY     PACEMAKER INSERTION     [employer]   TONSILLECTOMY     TUBAL LIGATION     VAGINAL HYSTERECTOMY     WISDOM TOOTH EXTRACTION      FAMILY HISTORY: Family History  Problem Relation Age of Onset   Stomach cancer Paternal Grandfather 61   Hypertension Mother    Arrhythmia Neg Hx    Clotting disorder Neg Hx    Heart attack Neg Hx    Fainting Neg Hx    Anemia Neg Hx    Heart disease Neg Hx    Heart failure Neg Hx    Hyperlipidemia Neg Hx    Colon cancer Neg Hx    Her father died at age 94 from cirrhosis of the liver. Her mother died at age 13 from pneumonia/sepsis. Judy Chapman has 1 brother. She reports stomach cancer in her paternal grandfather.  There is no other history of breast ovarian or prostate cancer in the family.   GYNECOLOGIC HISTORY:  No LMP recorded. Patient has had a hysterectomy. Menarche: 74 years old Age at first live birth: 74 years old [place] P 2 LMP prior to hysterectomy HRT used estradiol pill until breast cancer diagnosis (mid May 2021) Hysterectomy? Yes, in her early 90's BSO? no   SOCIAL HISTORY: (updated 02/2020)  Judy Chapman is currently retired from working as a [employer]. Husband Judy Chapman is an Holiday representative and works in Paediatric nurse. She lives at home with her husband. Daughter Judy Chapman, age 69, works as an Metallurgist here in [place]. Daughter Judy Chapman, age 51, is a Doctor, hospital with [employer] (an investment firm) in [place], [place]. Judy Chapman has 4 grandchildren. She is a Tourist information centre manager.    ADVANCED DIRECTIVES: In the absence of any documentation to the contrary, the patient's spouse is their HCPOA.    HEALTH MAINTENANCE: Social History   Tobacco Use   Smoking status: Former Smoker    Packs/day: 0.30    Years: 10.00    Pack years: 3.00    Types: Cigarettes     Quit date: 10/26/2005    Years since quitting: 14.6   Smokeless tobacco: Never Used  Vaping Use   Vaping Use: Never used  Substance Use Topics   Alcohol use: Yes    Alcohol/week: 10.0 standard drinks    Types: 10 Glasses of wine per week    Comment: 2 glasses of wine daily   Drug use: No     Colonoscopy: 10/2012 (Dr. Deatra Ina)  PAP: 2015  Bone density: 2019   Allergies  Allergen Reactions   Augmentin [Amoxicillin-Pot Clavulanate]     diarrhea   Macrodantin [Nitrofurantoin] Other (See Comments)    Sweating and chills   Ramipril Hives and Swelling         Current Outpatient Medications  Medication Sig Dispense Refill   allopurinol (ZYLOPRIM) 100 MG tablet Take 200 mg by mouth daily.     Cholecalciferol (VITAMIN D PO) Take 2,000 Units by mouth daily.      citalopram (CELEXA)  20 MG tablet Take 1 tablet (20 mg total) by mouth daily. 90 tablet 2   HYDROcodone-acetaminophen (NORCO/VICODIN) 5-325 MG tablet Take 1-2 tablets by mouth every 6 (six) hours as needed for moderate pain or severe pain. 15 tablet 0   nebivolol (BYSTOLIC) 10 MG tablet Take 10 mg by mouth daily.     No current facility-administered medications for this visit.    OBJECTIVE: White woman who appears well  There were no vitals filed for this visit.   There is no height or weight on file to calculate BMI.   Wt Readings from Last 3 Encounters:  03/27/20 175 lb (79.4 kg)  03/26/20 177 lb 8 oz (80.5 kg)  03/20/20 176 lb 14.4 oz (80.2 kg)      ECOG FS:1 - Symptomatic but completely ambulatory  Telemedicine visit 06/03/2020   LAB RESULTS:  CMP     Component Value Date/Time   NA 143 03/20/2020 0827   K 4.0 03/20/2020 0827   CL 108 03/20/2020 0827   CO2 27 03/20/2020 0827   GLUCOSE 94 03/20/2020 0827   BUN 22 03/20/2020 0827   CREATININE 0.76 03/20/2020 0827   CALCIUM 8.4 (L) 03/20/2020 0827   PROT 6.6 03/20/2020 0827   ALBUMIN 3.6 03/20/2020 0827   AST 20 03/20/2020 0827   ALT 20  03/20/2020 0827   ALKPHOS 53 03/20/2020 0827   BILITOT 0.6 03/20/2020 0827   GFRNONAA >60 03/20/2020 0827   GFRAA >60 03/20/2020 0827    No results found for: TOTALPROTELP, ALBUMINELP, A1GS, A2GS, BETS, BETA2SER, GAMS, MSPIKE, SPEI  Lab Results  Component Value Date   WBC 5.0 03/20/2020   NEUTROABS 2.8 03/20/2020   HGB 13.6 03/20/2020   HCT 40.8 03/20/2020   MCV 97.4 03/20/2020   PLT 204 03/20/2020    No results found for: LABCA2  No components found for: YPPJKD326  No results for input(s): INR in the last 168 hours.  No results found for: LABCA2  No results found for: ZTI458  No results found for: KDX833  No results found for: ASN053  No results found for: CA2729  No components found for: HGQUANT  No results found for: CEA1 / No results found for: CEA1   No results found for: AFPTUMOR  No results found for: CHROMOGRNA  No results found for: KPAFRELGTCHN, LAMBDASER, KAPLAMBRATIO (kappa/lambda light chains)  No results found for: HGBA, HGBA2QUANT, HGBFQUANT, HGBSQUAN (Hemoglobinopathy evaluation)   No results found for: LDH  No results found for: IRON, TIBC, IRONPCTSAT (Iron and TIBC)  No results found for: FERRITIN  Urinalysis    Component Value Date/Time   COLORURINE YELLOW 03/30/2014 1543   APPEARANCEUR CLEAR 03/30/2014 1543   LABSPEC 1.020 03/30/2014 1543   PHURINE 6.5 03/30/2014 1543   GLUCOSEU NEG 03/30/2014 1543   HGBUR TRACE (A) 03/30/2014 1543   BILIRUBINUR NEG 03/30/2014 1543   KETONESUR NEG 03/30/2014 1543   PROTEINUR NEG 03/30/2014 1543   UROBILINOGEN 0.2 03/30/2014 1543   NITRITE NEG 03/30/2014 1543   LEUKOCYTESUR NEG 03/30/2014 1543    STUDIES: No results found.   ELIGIBLE FOR AVAILABLE RESEARCH PROTOCOL: AET  ASSESSMENT: 74 y.o. [place] woman status post right lumpectomy without sentinel lymph node sampling 03/27/2020 for a pT1b cN0, stage IA invasive lobular breast cancer, estrogen and progesterone receptor positive,  HER-2 not amplified, with an MIB-1 of 10%.  (1) adjuvant radiation may be safely omitted if the patient takes an antiestrogen for 5 years  (2) tamoxifen started 04/18/2020  (a) status  post hysterectomy   PLAN: Judy Chapman is tolerating tamoxifen well and I do not anticipate any significant problems with her continuing on that drug for 5 years.  I encouraged her on her exercise program and urged her to increase the length of walking.  For the nighttime hot flashes I suggested gabapentin but at this point she does not feel she needs any intervention there.  She already has an appointment with me in October.  She knows to call for any other issues that may develop before then.  Virgie Dad. Dontreal Miera, MD 06/03/2020 12:08 PM Medical Oncology and Hematology [facility] [place], [place] [address] Tel. [phone]    Fax. [phone]   This document serves as a record of services personally performed by Lurline Del, MD. It was created on his behalf by Wilburn Mylar, a trained medical scribe. The creation of this record is based on the scribe's personal observations and the provider's statements to them.   I, Lurline Del MD, have reviewed the above documentation for accuracy and completeness, and I agree with the above.   *Total Encounter Time as defined by the Centers for Medicare and Medicaid Services includes, in addition to the face-to-face time of a patient visit (documented in the note above) non-face-to-face time: obtaining and reviewing outside history, ordering and reviewing medications, tests or procedures, care coordination (communications with other health care professionals or caregivers) and documentation in the medical record.

## 2020-06-03 ENCOUNTER — Telehealth (HOSPITAL_BASED_OUTPATIENT_CLINIC_OR_DEPARTMENT_OTHER): Payer: Medicare PPO | Admitting: Oncology

## 2020-06-03 DIAGNOSIS — Z170 Estrogen receptor positive status [ER+]: Secondary | ICD-10-CM | POA: Diagnosis not present

## 2020-06-03 DIAGNOSIS — C50211 Malignant neoplasm of upper-inner quadrant of right female breast: Secondary | ICD-10-CM | POA: Diagnosis not present

## 2020-07-03 ENCOUNTER — Telehealth: Payer: Self-pay | Admitting: *Deleted

## 2020-07-03 NOTE — Telephone Encounter (Signed)
This RN spoke with pt per her call stating she has had 5 nights of insomnia and feels it is associated with current start of tamoxifen.  Judy Chapman states she usually falls asleep very easily- " I have been known to sleep like a baby and not ever have a problem"  She states insomnia is not associated with hot flashes ( which have lessened ), nor does she feel any anxiety or have ruminating thoughts.  " just lay there unable to sleep ".  This RN discussed above including use of melatonin for sleep assistance - pt is concerned with why this is happening and wants to know if it is the tamoxifen. With further discussion the current plan for pt to hold the tamoxifen for 5 nights- she will monitor if sleep improves and let us know on 07/08/2020.

## 2020-07-08 ENCOUNTER — Telehealth: Payer: Self-pay | Admitting: *Deleted

## 2020-07-08 NOTE — Telephone Encounter (Signed)
This RN returned VM left by the patient stating " I am calling to follow up after stopping the tamoxifen because I wasn't sleeping "  Return call number given as [phone].  Obtained identified VM- message left call was being returned and continued effort will be made to speak with her.

## 2020-07-09 ENCOUNTER — Telehealth: Payer: Self-pay | Admitting: *Deleted

## 2020-07-09 NOTE — Telephone Encounter (Signed)
Attempt to contact pt per follow up on holding tamoxifen for 5 days due insomnia she feels is related to the Tamoxifen.  Per message she stated she " seems to be sleeping better ".  Note this RN and patient have left several messages attempting to discuss concern further and recommendation.

## 2020-07-10 ENCOUNTER — Telehealth: Payer: Self-pay | Admitting: *Deleted

## 2020-07-10 NOTE — Telephone Encounter (Signed)
Pt left VM stating she received VM about taking 1/2 tab every other day and taper up to 20 mg daily but needs further directions.  This RN returned call and obtained VM- message left stating to start at every other day 1/2 tab for 1 week, then 1/2 tab daily for 1 week and then increase to 20 mg every day- if she develops insomnia she is to call and let us know and resume at dose she tolerated best.

## 2020-07-22 ENCOUNTER — Ambulatory Visit (INDEPENDENT_AMBULATORY_CARE_PROVIDER_SITE_OTHER): Payer: Medicare PPO | Admitting: Emergency Medicine

## 2020-07-22 DIAGNOSIS — I495 Sick sinus syndrome: Secondary | ICD-10-CM | POA: Diagnosis not present

## 2020-07-23 LAB — CUP PACEART REMOTE DEVICE CHECK
Battery Impedance: 1344 Ohm
Battery Remaining Longevity: 53 mo
Battery Voltage: 2.78 V
Brady Statistic AP VP Percent: 0 %
Brady Statistic AP VS Percent: 63 %
Brady Statistic AS VP Percent: 0 %
Brady Statistic AS VS Percent: 36 %
Date Time Interrogation Session: 20210928114324
Implantable Lead Implant Date: 20020701
Implantable Lead Implant Date: 20020701
Implantable Lead Location: 753859
Implantable Lead Location: 753860
Implantable Lead Model: 4469
Implantable Lead Model: 4470
Implantable Lead Serial Number: 313853
Implantable Lead Serial Number: 323286
Implantable Pulse Generator Implant Date: 20100908
Lead Channel Impedance Value: 435 Ohm
Lead Channel Impedance Value: 558 Ohm
Lead Channel Pacing Threshold Amplitude: 0.625 V
Lead Channel Pacing Threshold Amplitude: 0.625 V
Lead Channel Pacing Threshold Pulse Width: 0.4 ms
Lead Channel Pacing Threshold Pulse Width: 0.4 ms
Lead Channel Setting Pacing Amplitude: 2 V
Lead Channel Setting Pacing Amplitude: 2.5 V
Lead Channel Setting Pacing Pulse Width: 0.4 ms
Lead Channel Setting Sensing Sensitivity: 2 mV

## 2020-07-25 NOTE — Progress Notes (Signed)
Remote pacemaker transmission.   

## 2020-08-07 ENCOUNTER — Inpatient Hospital Stay: Payer: Medicare PPO

## 2020-08-07 ENCOUNTER — Other Ambulatory Visit: Payer: Self-pay

## 2020-08-07 ENCOUNTER — Inpatient Hospital Stay: Payer: Medicare PPO | Attending: Oncology | Admitting: Oncology

## 2020-08-07 VITALS — BP 134/68 | HR 81 | Temp 97.9°F | Resp 18 | Ht 64.5 in | Wt 179.1 lb

## 2020-08-07 DIAGNOSIS — C50211 Malignant neoplasm of upper-inner quadrant of right female breast: Secondary | ICD-10-CM | POA: Diagnosis not present

## 2020-08-07 DIAGNOSIS — Z79899 Other long term (current) drug therapy: Secondary | ICD-10-CM | POA: Insufficient documentation

## 2020-08-07 DIAGNOSIS — Z880 Allergy status to penicillin: Secondary | ICD-10-CM | POA: Diagnosis not present

## 2020-08-07 DIAGNOSIS — Z87891 Personal history of nicotine dependence: Secondary | ICD-10-CM | POA: Diagnosis not present

## 2020-08-07 DIAGNOSIS — Z79811 Long term (current) use of aromatase inhibitors: Secondary | ICD-10-CM | POA: Insufficient documentation

## 2020-08-07 DIAGNOSIS — Z800 Family history of malignant neoplasm of digestive organs: Secondary | ICD-10-CM | POA: Diagnosis not present

## 2020-08-07 DIAGNOSIS — Z79810 Long term (current) use of selective estrogen receptor modulators (SERMs): Secondary | ICD-10-CM | POA: Diagnosis not present

## 2020-08-07 DIAGNOSIS — Z881 Allergy status to other antibiotic agents status: Secondary | ICD-10-CM | POA: Diagnosis not present

## 2020-08-07 DIAGNOSIS — Z170 Estrogen receptor positive status [ER+]: Secondary | ICD-10-CM

## 2020-08-07 DIAGNOSIS — Z90710 Acquired absence of both cervix and uterus: Secondary | ICD-10-CM | POA: Insufficient documentation

## 2020-08-07 DIAGNOSIS — M8580 Other specified disorders of bone density and structure, unspecified site: Secondary | ICD-10-CM | POA: Diagnosis not present

## 2020-08-07 DIAGNOSIS — Z8249 Family history of ischemic heart disease and other diseases of the circulatory system: Secondary | ICD-10-CM | POA: Insufficient documentation

## 2020-08-07 LAB — CBC WITH DIFFERENTIAL (CANCER CENTER ONLY)
Abs Immature Granulocytes: 0.01 10*3/uL (ref 0.00–0.07)
Basophils Absolute: 0.1 10*3/uL (ref 0.0–0.1)
Basophils Relative: 1 %
Eosinophils Absolute: 0.2 10*3/uL (ref 0.0–0.5)
Eosinophils Relative: 3 %
HCT: 38.2 % (ref 36.0–46.0)
Hemoglobin: 12.7 g/dL (ref 12.0–15.0)
Immature Granulocytes: 0 %
Lymphocytes Relative: 24 %
Lymphs Abs: 1.5 10*3/uL (ref 0.7–4.0)
MCH: 32.2 pg (ref 26.0–34.0)
MCHC: 33.2 g/dL (ref 30.0–36.0)
MCV: 97 fL (ref 80.0–100.0)
Monocytes Absolute: 0.7 10*3/uL (ref 0.1–1.0)
Monocytes Relative: 11 %
Neutro Abs: 3.9 10*3/uL (ref 1.7–7.7)
Neutrophils Relative %: 61 %
Platelet Count: 194 10*3/uL (ref 150–400)
RBC: 3.94 MIL/uL (ref 3.87–5.11)
RDW: 13.5 % (ref 11.5–15.5)
WBC Count: 6.4 10*3/uL (ref 4.0–10.5)
nRBC: 0 % (ref 0.0–0.2)

## 2020-08-07 LAB — CMP (CANCER CENTER ONLY)
ALT: 22 U/L (ref 0–44)
AST: 24 U/L (ref 15–41)
Albumin: 3.6 g/dL (ref 3.5–5.0)
Alkaline Phosphatase: 49 U/L (ref 38–126)
Anion gap: 6 (ref 5–15)
BUN: 21 mg/dL (ref 8–23)
CO2: 27 mmol/L (ref 22–32)
Calcium: 8.7 mg/dL — ABNORMAL LOW (ref 8.9–10.3)
Chloride: 108 mmol/L (ref 98–111)
Creatinine: 0.8 mg/dL (ref 0.44–1.00)
GFR, Estimated: >60 mL/min (ref >60)
Glucose, Bld: 89 mg/dL (ref 70–99)
Potassium: 4 mmol/L (ref 3.5–5.1)
Sodium: 141 mmol/L (ref 135–145)
Total Bilirubin: 0.6 mg/dL (ref 0.3–1.2)
Total Protein: 6.7 g/dL (ref 6.5–8.1)

## 2020-08-07 NOTE — Progress Notes (Signed)
[place]  Telephone:(336) [phone] Fax:(336) [phone]     ID: Judy Chapman DOB: 08-31-1946  MR#: [id number]  QQP#:[id number]  Patient Care Team: Crist Infante, MD as PCP - General (Internal Medicine) Deboraha Sprang, MD as PCP - Cardiology (Cardiology) Rockwell Germany, RN as Oncology Nurse Navigator Mauro Kaufmann, RN as Oncology Nurse Navigator Jovita Kussmaul, MD as Consulting Physician (General Surgery) Sylvain Hasten, Virgie Dad, MD as Consulting Physician (Oncology) Eppie Gibson, MD as Attending Physician (Radiation Oncology) Deboraha Sprang, MD as Consulting Physician (Cardiology) Chauncey Cruel, MD OTHER MD:   CHIEF COMPLAINT: estrogen receptor positive breast cancer  CURRENT TREATMENT: tamoxifen   INTERVAL HISTORY: Judy Chapman returns today for follow up of her estrogen receptor positive breast cancer.   She continues on tamoxifen.  We have had to keep lowering the dose because she has had a variety of symptoms including some numbness and tingling which may or may not be related, some fluid retention which also may or may not be related but importantly she certainly has had insomnia issues.  She tells me a year ago she had no problems sleeping and now she just cannot sleep.  She is down to 5 mg of tamoxifen and that has not helped.  She is taking melatonin and that occasionally helps.  She is trying CBD oil and that may or may not help she says.  Since her last visit, she has not undergone any additional studies.   REVIEW OF SYSTEMS: Judy Chapman is not exercising regularly at present.  She does walk sometimes.  Hot flashes have really not been an issue with the tamoxifen or vaginal wetness.  A detailed review of systems today was otherwise stable   HISTORY OF CURRENT ILLNESS: From the original intake note:  Judy Chapman had routine screening mammography on 02/29/2020 showing a possible abnormality in the right breast. She underwent right diagnostic  mammography with tomography and right breast ultrasonography at [facility] on 03/08/2020 showing: breast density category B; suspicious 8 mm mass in right breast at 1 o'clock; right axilla unremarkable.  Accordingly on 03/11/2020 she proceeded to biopsy of the right breast area in question. The pathology from this procedure ([id number]) showed: invasive and in situ mammary carcinoma, grade 2, e-cadherin negative. Prognostic indicators significant for: estrogen receptor, 90% positive and progesterone receptor, 95% positive, both with strong staining intensity. Proliferation marker Ki67 at 10%. HER2 negative by immunohistochemistry (1+).  The patient's subsequent history is as detailed below.   PAST MEDICAL HISTORY: Past Medical History:  Diagnosis Date  . ACE inhibitor intolerance   . Arthritis   . Asthma    outgrew as a child  . Cancer [facility])    right breast  . Early cataract   . Endometriosis   . Gout   . Heart disease   . Hypertension   . Osteopenia   . Pacemaker-MDT    DOI-2002, Generator change 2010  . Pneumonia   . PVC (premature ventricular contraction)   . Sinus node dysfunction (HCC)   . Wears glasses     PAST SURGICAL HISTORY: Past Surgical History:  Procedure Laterality Date  . BREAST LUMPECTOMY     Benign  . BREAST LUMPECTOMY WITH RADIOACTIVE SEED LOCALIZATION Right 03/27/2020   Procedure: RIGHT BREAST LUMPECTOMY WITH RADIOACTIVE SEED LOCALIZATION;  Surgeon: Jovita Kussmaul, MD;  Location: [place];  Service: General;  Laterality: Right;  . CORONARY ANGIOPLASTY    . DILATION AND CURETTAGE OF UTERUS    .  GALLBLADDER SURGERY    . HYSTEROSCOPY    . PACEMAKER INSERTION     Medtronic  . TONSILLECTOMY    . TUBAL LIGATION    . VAGINAL HYSTERECTOMY    . WISDOM TOOTH EXTRACTION      FAMILY HISTORY: Family History  Problem Relation Age of Onset  . Stomach cancer Paternal Grandfather 76  . Hypertension Mother   . Arrhythmia Neg Hx   . Clotting disorder Neg Hx   .  Heart attack Neg Hx   . Fainting Neg Hx   . Anemia Neg Hx   . Heart disease Neg Hx   . Heart failure Neg Hx   . Hyperlipidemia Neg Hx   . Colon cancer Neg Hx    Her father died at age 10 from cirrhosis of the liver. Her mother died at age 64 from pneumonia/sepsis. Judy Chapman has 1 brother. She reports stomach cancer in her paternal grandfather.  There is no other history of breast ovarian or prostate cancer in the family.   GYNECOLOGIC HISTORY:  No LMP recorded. Patient has had a hysterectomy. Menarche: 74 years old Age at first live birth: 74 years old [place] P 2 LMP prior to hysterectomy HRT used estradiol pill until breast cancer diagnosis (mid May 2021) Hysterectomy? Yes, in her early 40's BSO? no   SOCIAL HISTORY: (updated 02/2020)  Judy Chapman is currently retired from working as a [employer]. Husband Delfino Lovett is an Holiday representative and works in Paediatric nurse. She lives at home with her husband. Daughter Charyl Bigger, age 75, works as an Metallurgist here in [place]. Daughter Andris Baumann, age 70, is a Doctor, hospital with [employer] (an investment firm) in [place], [place]. Judy Chapman has 4 grandchildren. She is a Tourist information centre manager.    ADVANCED DIRECTIVES: In the absence of any documentation to the contrary, the patient's spouse is their HCPOA.    HEALTH MAINTENANCE: Social History   Tobacco Use  . Smoking status: Former Smoker    Packs/day: 0.30    Years: 10.00    Pack years: 3.00    Types: Cigarettes    Quit date: 10/26/2005    Years since quitting: 14.7  . Smokeless tobacco: Never Used  Vaping Use  . Vaping Use: Never used  Substance Use Topics  . Alcohol use: Yes    Alcohol/week: 10.0 standard drinks    Types: 10 Glasses of wine per week    Comment: 2 glasses of wine daily  . Drug use: No     Colonoscopy: 10/2012 (Dr. Deatra Ina)  PAP: 2015  Bone density: 2019   Allergies  Allergen Reactions  . Augmentin [Amoxicillin-Pot Clavulanate]     diarrhea  . Macrodantin  [Nitrofurantoin] Other (See Comments)    Sweating and chills  . Ramipril Hives and Swelling         Current Outpatient Medications  Medication Sig Dispense Refill  . allopurinol (ZYLOPRIM) 100 MG tablet Take 200 mg by mouth daily.    . Cholecalciferol (VITAMIN D PO) Take 2,000 Units by mouth daily.     . citalopram (CELEXA) 20 MG tablet Take 1 tablet (20 mg total) by mouth daily. 90 tablet 2  . nebivolol (BYSTOLIC) 10 MG tablet Take 10 mg by mouth daily.     No current facility-administered medications for this visit.    OBJECTIVE: White woman in no acute distress  Vitals:   08/07/20 1539  BP: 134/68  Pulse: 81  Resp: 18  Temp: 97.9 F (36.6 C)  SpO2:  95%     Body mass index is 30.27 kg/m.   Wt Readings from Last 3 Encounters:  08/07/20 179 lb 1.6 oz (81.2 kg)  03/27/20 175 lb (79.4 kg)  03/26/20 177 lb 8 oz (80.5 kg)      ECOG FS:1 - Symptomatic but completely ambulatory  Sclerae unicteric, EOMs intact Wearing a mask No cervical or supraclavicular adenopathy Lungs no rales or rhonchi Heart regular rate and rhythm Abd soft, nontender, positive bowel sounds MSK no focal spinal tenderness, no upper extremity lymphedema Neuro: nonfocal, well oriented, appropriate affect Breasts: For   LAB RESULTS:  CMP     Component Value Date/Time   NA 141 08/07/2020 1448   K 4.0 08/07/2020 1448   CL 108 08/07/2020 1448   CO2 27 08/07/2020 1448   GLUCOSE 89 08/07/2020 1448   BUN 21 08/07/2020 1448   CREATININE 0.80 08/07/2020 1448   CALCIUM 8.7 (L) 08/07/2020 1448   PROT 6.7 08/07/2020 1448   ALBUMIN 3.6 08/07/2020 1448   AST 24 08/07/2020 1448   ALT 22 08/07/2020 1448   ALKPHOS 49 08/07/2020 1448   BILITOT 0.6 08/07/2020 1448   GFRNONAA >60 08/07/2020 1448   GFRAA >60 03/20/2020 0827    No results found for: TOTALPROTELP, ALBUMINELP, A1GS, A2GS, BETS, BETA2SER, GAMS, MSPIKE, SPEI  Lab Results  Component Value Date   WBC 6.4 08/07/2020   NEUTROABS 3.9  08/07/2020   HGB 12.7 08/07/2020   HCT 38.2 08/07/2020   MCV 97.0 08/07/2020   PLT 194 08/07/2020    No results found for: LABCA2  No components found for: KJZPHX505  No results for input(s): INR in the last 168 hours.  No results found for: LABCA2  No results found for: WPV948  No results found for: AXK553  No results found for: ZSM270  No results found for: CA2729  No components found for: HGQUANT  No results found for: CEA1 / No results found for: CEA1   No results found for: AFPTUMOR  No results found for: CHROMOGRNA  No results found for: KPAFRELGTCHN, LAMBDASER, KAPLAMBRATIO (kappa/lambda light chains)  No results found for: HGBA, HGBA2QUANT, HGBFQUANT, HGBSQUAN (Hemoglobinopathy evaluation)   No results found for: LDH  No results found for: IRON, TIBC, IRONPCTSAT (Iron and TIBC)  No results found for: FERRITIN  Urinalysis    Component Value Date/Time   COLORURINE YELLOW 03/30/2014 1543   APPEARANCEUR CLEAR 03/30/2014 1543   LABSPEC 1.020 03/30/2014 1543   PHURINE 6.5 03/30/2014 1543   GLUCOSEU NEG 03/30/2014 1543   HGBUR TRACE (A) 03/30/2014 1543   BILIRUBINUR NEG 03/30/2014 1543   KETONESUR NEG 03/30/2014 1543   PROTEINUR NEG 03/30/2014 1543   UROBILINOGEN 0.2 03/30/2014 1543   NITRITE NEG 03/30/2014 1543   LEUKOCYTESUR NEG 03/30/2014 1543    STUDIES: CUP PACEART REMOTE DEVICE CHECK  Result Date: 07/23/2020 Scheduled remote reviewed. Normal device function.  Next remote 91 days- JBox, RN/CVRS    ELIGIBLE FOR AVAILABLE RESEARCH PROTOCOL: AET  ASSESSMENT: 74 y.o. [place] woman status post right lumpectomy without sentinel lymph node sampling 03/27/2020 for a pT1b cN0, stage IA invasive lobular breast cancer, estrogen and progesterone receptor positive, HER-2 not amplified, with an MIB-1 of 10%.  (1) adjuvant radiation may be safely omitted if the patient takes an antiestrogen for 5 years  (2) tamoxifen started 04/18/2020  (a) status  post hysterectomy  (b) discontinued October 2021 after multiple dose reductions did not resolve symptoms   PLAN: Judy Chapman has had a significant change  in her sleep pattern since starting tamoxifen.  This is not common but it is not impossible and she has given it a good try at lower and lower doses without resolving the problem.  I think at this point we simply have to go off the medication.  I am going to speak with her through a virtual visit and a second week in December.  If everything is the same then we can go back to tamoxifen but more likely we will start anastrozole at that time  She knows to call for any other issue that may develop before then  Total encounter time 25 minutes.Sarajane Jews C. Mailee Klaas, MD 08/07/2020 4:12 PM Medical Oncology and Hematology [facility] [place], [place] [address] Tel. [phone]    Fax. [phone]   This document serves as a record of services personally performed by Lurline Del, MD. It was created on his behalf by Wilburn Mylar, a trained medical scribe. The creation of this record is based on the scribe's personal observations and the provider's statements to them.   I, Lurline Del MD, have reviewed the above documentation for accuracy and completeness, and I agree with the above.   *Total Encounter Time as defined by the Centers for Medicare and Medicaid Services includes, in addition to the face-to-face time of a patient visit (documented in the note above) non-face-to-face time: obtaining and reviewing outside history, ordering and reviewing medications, tests or procedures, care coordination (communications with other health care professionals or caregivers) and documentation in the medical record.

## 2020-10-07 NOTE — Progress Notes (Signed)
[place]  Telephone:(336) [phone] Fax:(336) [phone]     ID: Judy Chapman DOB: 05-12-1946  MR#: [id number]  LEX#:[id number]  Patient Care Team: Crist Infante, MD as PCP - General (Internal Medicine) Deboraha Sprang, MD as PCP - Cardiology (Cardiology) Rockwell Germany, RN as Oncology Nurse Navigator Mauro Kaufmann, RN as Oncology Nurse Navigator Jovita Kussmaul, MD as Consulting Physician (General Surgery) Mandi Mattioli, Virgie Dad, MD as Consulting Physician (Oncology) Eppie Gibson, MD as Attending Physician (Radiation Oncology) Deboraha Sprang, MD as Consulting Physician (Cardiology) Chauncey Cruel, MD OTHER MD:  I connected with Judy Chapman on 10/08/20 at  9:15 AM EST by telephone visit and verified that I am speaking with the correct person using two identifiers.   I discussed the limitations, risks, security and privacy concerns of performing an evaluation and management service by telemedicine and the availability of in-person appointments. I also discussed with the patient that there may be a patient responsible charge related to this service. The patient expressed understanding and agreed to proceed.   Other persons participating in the visit and their role in the encounter: None  Patient's location: Home Provider's location: [place]: estrogen receptor positive breast cancer  CURRENT TREATMENT: tamoxifen   INTERVAL HISTORY: Judy Chapman was contacted today for follow up of her estrogen receptor positive breast cancer.   She has been off tamoxifen since her last visit on 08/07/2020.  She tells me all the horrible symptoms that she had been experiencing have resolved and she feels "wonderful".  Clearly tamoxifen is not going to be her drug  Since her last visit, she has not undergone any additional studies.   REVIEW OF SYSTEMS: Judy Chapman tells me she is back to her regular activities.  She actually lives in my  neighborhood and has seen me walking the dog she says, which is kind of fun.  A detailed review of systems today was otherwise stable   COVID 19 VACCINATION STATUS: s/p Pfizer x2 followed by [facility] SEPT   HISTORY OF CURRENT ILLNESS: From the original intake note:  Judy Chapman had routine screening mammography on 02/29/2020 showing a possible abnormality in the right breast. She underwent right diagnostic mammography with tomography and right breast ultrasonography at [facility] on 03/08/2020 showing: breast density category B; suspicious 8 mm mass in right breast at 1 o'clock; right axilla unremarkable.  Accordingly on 03/11/2020 she proceeded to biopsy of the right breast area in question. The pathology from this procedure ([id number]) showed: invasive and in situ mammary carcinoma, grade 2, e-cadherin negative. Prognostic indicators significant for: estrogen receptor, 90% positive and progesterone receptor, 95% positive, both with strong staining intensity. Proliferation marker Ki67 at 10%. HER2 negative by immunohistochemistry (1+).  The patient's subsequent history is as detailed below.   PAST MEDICAL HISTORY: Past Medical History:  Diagnosis Date  . ACE inhibitor intolerance   . Arthritis   . Asthma    outgrew as a child  . Cancer [facility])    right breast  . Early cataract   . Endometriosis   . Gout   . Heart disease   . Hypertension   . Osteopenia   . Pacemaker-MDT    DOI-2002, Generator change 2010  . Pneumonia   . PVC (premature ventricular contraction)   . Sinus node dysfunction (HCC)   . Wears glasses     PAST SURGICAL HISTORY: Past Surgical History:  Procedure Laterality Date  .  BREAST LUMPECTOMY     Benign  . BREAST LUMPECTOMY WITH RADIOACTIVE SEED LOCALIZATION Right 03/27/2020   Procedure: RIGHT BREAST LUMPECTOMY WITH RADIOACTIVE SEED LOCALIZATION;  Surgeon: Jovita Kussmaul, MD;  Location: [place];  Service: General;  Laterality: Right;  . CORONARY  ANGIOPLASTY    . DILATION AND CURETTAGE OF UTERUS    . GALLBLADDER SURGERY    . HYSTEROSCOPY    . PACEMAKER INSERTION     Medtronic  . TONSILLECTOMY    . TUBAL LIGATION    . VAGINAL HYSTERECTOMY    . WISDOM TOOTH EXTRACTION      FAMILY HISTORY: Family History  Problem Relation Age of Onset  . Stomach cancer Paternal Grandfather 89  . Hypertension Mother   . Arrhythmia Neg Hx   . Clotting disorder Neg Hx   . Heart attack Neg Hx   . Fainting Neg Hx   . Anemia Neg Hx   . Heart disease Neg Hx   . Heart failure Neg Hx   . Hyperlipidemia Neg Hx   . Colon cancer Neg Hx    Her father died at age 19 from cirrhosis of the liver. Her mother died at age 68 from pneumonia/sepsis. Judy Chapman has 1 brother. She reports stomach cancer in her paternal grandfather.  There is no other history of breast ovarian or prostate cancer in the family.   GYNECOLOGIC HISTORY:  No LMP recorded. Patient has had a hysterectomy. Menarche: 74 years old Age at first live birth: 74 years old [place] P 2 LMP prior to hysterectomy HRT used estradiol pill until breast cancer diagnosis (mid May 2021) Hysterectomy? Yes, in her early 34's BSO? no   SOCIAL HISTORY: (updated 02/2020)  Judy Chapman is currently retired from working as a [employer]rd[employer]. Husband Judy Chapman is an Holiday representative and works in Paediatric nurse. She lives at home with her husband. Daughter Judy Chapman, age 49, works as an Metallurgist here in [place]. Daughter Judy Chapman, age 72, is a Doctor, hospital with [employer] (an investment firm) in [place], [place]. Judy Chapman has 4 grandchildren. She is a Tourist information centre manager.    ADVANCED DIRECTIVES: In the absence of any documentation to the contrary, the patient's spouse is their HCPOA.    HEALTH MAINTENANCE: Social History   Tobacco Use  . Smoking status: Former Smoker    Packs/day: 0.30    Years: 10.00    Pack years: 3.00    Types: Cigarettes    Quit date: 10/26/2005    Years since quitting: 14.9  . Smokeless  tobacco: Never Used  Vaping Use  . Vaping Use: Never used  Substance Use Topics  . Alcohol use: Yes    Alcohol/week: 10.0 standard drinks    Types: 10 Glasses of wine per week    Comment: 2 glasses of wine daily  . Drug use: No     Colonoscopy: 10/2012 (Dr. Deatra Ina)  PAP: 2015  Bone density: 2019   Allergies  Allergen Reactions  . Augmentin [Amoxicillin-Pot Clavulanate]     diarrhea  . Macrodantin [Nitrofurantoin] Other (See Comments)    Sweating and chills  . Ramipril Hives and Swelling         Current Outpatient Medications  Medication Sig Dispense Refill  . allopurinol (ZYLOPRIM) 100 MG tablet Take 200 mg by mouth daily.    Marland Kitchen anastrozole (ARIMIDEX) 1 MG tablet Take 1 tablet (1 mg total) by mouth daily. 90 tablet 4  . Cholecalciferol (VITAMIN D PO) Take 2,000 Units by mouth daily.     Marland Kitchen  citalopram (CELEXA) 20 MG tablet Take 1 tablet (20 mg total) by mouth daily. 90 tablet 2  . nebivolol (BYSTOLIC) 10 MG tablet Take 10 mg by mouth daily.     No current facility-administered medications for this visit.    OBJECTIVE:   There were no vitals filed for this visit.   There is no height or weight on file to calculate BMI.   Wt Readings from Last 3 Encounters:  08/07/20 179 lb 1.6 oz (81.2 kg)  03/27/20 175 lb (79.4 kg)  03/26/20 177 lb 8 oz (80.5 kg)      ECOG FS:1 - Symptomatic but completely ambulatory  Telemedicine visit 10/08/2020   LAB RESULTS:  CMP     Component Value Date/Time   NA 141 08/07/2020 1448   K 4.0 08/07/2020 1448   CL 108 08/07/2020 1448   CO2 27 08/07/2020 1448   GLUCOSE 89 08/07/2020 1448   BUN 21 08/07/2020 1448   CREATININE 0.80 08/07/2020 1448   CALCIUM 8.7 (L) 08/07/2020 1448   PROT 6.7 08/07/2020 1448   ALBUMIN 3.6 08/07/2020 1448   AST 24 08/07/2020 1448   ALT 22 08/07/2020 1448   ALKPHOS 49 08/07/2020 1448   BILITOT 0.6 08/07/2020 1448   GFRNONAA >60 08/07/2020 1448   GFRAA >60 03/20/2020 0827    No results found for:  TOTALPROTELP, ALBUMINELP, A1GS, A2GS, BETS, BETA2SER, GAMS, MSPIKE, SPEI  Lab Results  Component Value Date   WBC 6.4 08/07/2020   NEUTROABS 3.9 08/07/2020   HGB 12.7 08/07/2020   HCT 38.2 08/07/2020   MCV 97.0 08/07/2020   PLT 194 08/07/2020    No results found for: LABCA2  No components found for: GBMSXJ155  No results for input(s): INR in the last 168 hours.  No results found for: LABCA2  No results found for: MCE022  No results found for: VVK122  No results found for: ESL753  No results found for: CA2729  No components found for: HGQUANT  No results found for: CEA1 / No results found for: CEA1   No results found for: AFPTUMOR  No results found for: CHROMOGRNA  No results found for: KPAFRELGTCHN, LAMBDASER, KAPLAMBRATIO (kappa/lambda light chains)  No results found for: HGBA, HGBA2QUANT, HGBFQUANT, HGBSQUAN (Hemoglobinopathy evaluation)   No results found for: LDH  No results found for: IRON, TIBC, IRONPCTSAT (Iron and TIBC)  No results found for: FERRITIN  Urinalysis    Component Value Date/Time   COLORURINE YELLOW 03/30/2014 1543   APPEARANCEUR CLEAR 03/30/2014 1543   LABSPEC 1.020 03/30/2014 1543   PHURINE 6.5 03/30/2014 1543   GLUCOSEU NEG 03/30/2014 1543   HGBUR TRACE (A) 03/30/2014 1543   BILIRUBINUR NEG 03/30/2014 1543   KETONESUR NEG 03/30/2014 1543   PROTEINUR NEG 03/30/2014 1543   UROBILINOGEN 0.2 03/30/2014 1543   NITRITE NEG 03/30/2014 1543   LEUKOCYTESUR NEG 03/30/2014 1543    STUDIES: No results found.   ELIGIBLE FOR AVAILABLE RESEARCH PROTOCOL: AET  ASSESSMENT: 74 y.o. [place] woman status post right lumpectomy without sentinel lymph node sampling 03/27/2020 for a pT1b cN0, stage IA invasive lobular breast cancer, estrogen and progesterone receptor positive, HER-2 not amplified, with an MIB-1 of 10%.  (1) adjuvant radiation may be safely omitted if the patient takes an antiestrogen for 5 years  (2) tamoxifen started  04/18/2020  (a) status post hysterectomy  (b) discontinued October 2021 after multiple dose reductions did not resolve symptoms  (3) anastrozole to start 10/26/2020  PLAN: Meyer was unable to tolerate tamoxifen.  She is now 6 months out from surgery so I think it is a little bit late to return to radiation.  Accordingly we are going to try a different antiestrogen.  We discussed anastrozole in detail.  She is aware of concerns regarding hot flashes vaginal dryness and weakening of the bone density.  She tells me she had a very favorable bone density this year at Dr. Silvestre Mesi.  Nevertheless she as she is concerned about that as indeed my  The plan will be to start anastrozole 10/26/2020.  She is going to see me about 6 weeks later.  If she is tolerating it well we will address the bone density issue.  If she is not able to tolerate it we will have to consider Faslodex   Sarajane Jews C. Mayra Brahm, MD 10/08/2020 9:21 AM Medical Oncology and Hematology [facility] [place], [place] [address] Tel. [phone]    Fax. [phone]   This document serves as a record of services personally performed by Lurline Del, MD. It was created on his behalf by Wilburn Mylar, a trained medical scribe. The creation of this record is based on the scribe's personal observations and the provider's statements to them.   I, Lurline Del MD, have reviewed the above documentation for accuracy and completeness, and I agree with the above.   *Total Encounter Time as defined by the Centers for Medicare and Medicaid Services includes, in addition to the face-to-face time of a patient visit (documented in the note above) non-face-to-face time: obtaining and reviewing outside history, ordering and reviewing medications, tests or procedures, care coordination (communications with other health care professionals or caregivers) and documentation in the medical record.

## 2020-10-08 ENCOUNTER — Inpatient Hospital Stay: Payer: Medicare PPO | Attending: Oncology | Admitting: Oncology

## 2020-10-08 DIAGNOSIS — Z90710 Acquired absence of both cervix and uterus: Secondary | ICD-10-CM | POA: Diagnosis not present

## 2020-10-08 DIAGNOSIS — Z8379 Family history of other diseases of the digestive system: Secondary | ICD-10-CM | POA: Diagnosis not present

## 2020-10-08 DIAGNOSIS — Z170 Estrogen receptor positive status [ER+]: Secondary | ICD-10-CM

## 2020-10-08 DIAGNOSIS — Z800 Family history of malignant neoplasm of digestive organs: Secondary | ICD-10-CM | POA: Insufficient documentation

## 2020-10-08 DIAGNOSIS — Z8249 Family history of ischemic heart disease and other diseases of the circulatory system: Secondary | ICD-10-CM | POA: Diagnosis not present

## 2020-10-08 DIAGNOSIS — Z880 Allergy status to penicillin: Secondary | ICD-10-CM | POA: Insufficient documentation

## 2020-10-08 DIAGNOSIS — Z87891 Personal history of nicotine dependence: Secondary | ICD-10-CM | POA: Insufficient documentation

## 2020-10-08 DIAGNOSIS — C50211 Malignant neoplasm of upper-inner quadrant of right female breast: Secondary | ICD-10-CM | POA: Diagnosis not present

## 2020-10-08 DIAGNOSIS — Z79811 Long term (current) use of aromatase inhibitors: Secondary | ICD-10-CM | POA: Diagnosis not present

## 2020-10-08 DIAGNOSIS — Z881 Allergy status to other antibiotic agents status: Secondary | ICD-10-CM | POA: Diagnosis not present

## 2020-10-08 DIAGNOSIS — Z79899 Other long term (current) drug therapy: Secondary | ICD-10-CM | POA: Diagnosis not present

## 2020-10-08 DIAGNOSIS — Z7289 Other problems related to lifestyle: Secondary | ICD-10-CM | POA: Insufficient documentation

## 2020-10-08 MED ORDER — ANASTROZOLE 1 MG PO TABS
1.0000 mg | ORAL_TABLET | Freq: Every day | ORAL | 4 refills | Status: DC
Start: 2020-10-08 — End: 2021-03-18

## 2020-10-09 ENCOUNTER — Other Ambulatory Visit: Payer: Self-pay

## 2020-10-09 ENCOUNTER — Telehealth: Payer: Self-pay | Admitting: Oncology

## 2020-10-09 NOTE — Telephone Encounter (Signed)
Scheduled appts per 12/14 los. Pt confirmed appt date and time.

## 2020-10-22 ENCOUNTER — Ambulatory Visit (INDEPENDENT_AMBULATORY_CARE_PROVIDER_SITE_OTHER): Payer: Medicare PPO

## 2020-10-22 DIAGNOSIS — I495 Sick sinus syndrome: Secondary | ICD-10-CM

## 2020-10-23 LAB — CUP PACEART REMOTE DEVICE CHECK
Battery Impedance: 1344 Ohm
Battery Remaining Longevity: 53 mo
Battery Voltage: 2.77 V
Brady Statistic AP VP Percent: 1 %
Brady Statistic AP VS Percent: 59 %
Brady Statistic AS VP Percent: 0 %
Brady Statistic AS VS Percent: 40 %
Date Time Interrogation Session: 20211228094111
Implantable Lead Implant Date: 20020701
Implantable Lead Implant Date: 20020701
Implantable Lead Location: 753859
Implantable Lead Location: 753860
Implantable Lead Model: 4469
Implantable Lead Model: 4470
Implantable Lead Serial Number: 313853
Implantable Lead Serial Number: 323286
Implantable Pulse Generator Implant Date: 20100908
Lead Channel Impedance Value: 461 Ohm
Lead Channel Impedance Value: 578 Ohm
Lead Channel Pacing Threshold Amplitude: 0.625 V
Lead Channel Pacing Threshold Amplitude: 0.75 V
Lead Channel Pacing Threshold Pulse Width: 0.4 ms
Lead Channel Pacing Threshold Pulse Width: 0.4 ms
Lead Channel Setting Pacing Amplitude: 2 V
Lead Channel Setting Pacing Amplitude: 2.5 V
Lead Channel Setting Pacing Pulse Width: 0.4 ms
Lead Channel Setting Sensing Sensitivity: 2.8 mV

## 2020-11-04 NOTE — Progress Notes (Signed)
Remote pacemaker transmission.   

## 2020-11-05 DIAGNOSIS — M170 Bilateral primary osteoarthritis of knee: Secondary | ICD-10-CM | POA: Diagnosis not present

## 2020-11-27 DIAGNOSIS — M170 Bilateral primary osteoarthritis of knee: Secondary | ICD-10-CM | POA: Diagnosis not present

## 2020-12-04 DIAGNOSIS — M170 Bilateral primary osteoarthritis of knee: Secondary | ICD-10-CM | POA: Diagnosis not present

## 2020-12-11 DIAGNOSIS — M170 Bilateral primary osteoarthritis of knee: Secondary | ICD-10-CM | POA: Diagnosis not present

## 2020-12-18 ENCOUNTER — Other Ambulatory Visit: Payer: Self-pay

## 2020-12-18 ENCOUNTER — Inpatient Hospital Stay: Payer: Medicare PPO | Attending: Oncology | Admitting: Oncology

## 2020-12-18 VITALS — BP 131/73 | HR 72 | Temp 97.5°F | Resp 18 | Ht 64.5 in | Wt 176.7 lb

## 2020-12-18 DIAGNOSIS — Z7289 Other problems related to lifestyle: Secondary | ICD-10-CM | POA: Diagnosis not present

## 2020-12-18 DIAGNOSIS — Z79811 Long term (current) use of aromatase inhibitors: Secondary | ICD-10-CM | POA: Insufficient documentation

## 2020-12-18 DIAGNOSIS — R232 Flushing: Secondary | ICD-10-CM | POA: Diagnosis not present

## 2020-12-18 DIAGNOSIS — Z90710 Acquired absence of both cervix and uterus: Secondary | ICD-10-CM | POA: Insufficient documentation

## 2020-12-18 DIAGNOSIS — Z8379 Family history of other diseases of the digestive system: Secondary | ICD-10-CM | POA: Diagnosis not present

## 2020-12-18 DIAGNOSIS — Z881 Allergy status to other antibiotic agents status: Secondary | ICD-10-CM | POA: Insufficient documentation

## 2020-12-18 DIAGNOSIS — Z800 Family history of malignant neoplasm of digestive organs: Secondary | ICD-10-CM | POA: Insufficient documentation

## 2020-12-18 DIAGNOSIS — Z170 Estrogen receptor positive status [ER+]: Secondary | ICD-10-CM

## 2020-12-18 DIAGNOSIS — Z8249 Family history of ischemic heart disease and other diseases of the circulatory system: Secondary | ICD-10-CM | POA: Diagnosis not present

## 2020-12-18 DIAGNOSIS — Z880 Allergy status to penicillin: Secondary | ICD-10-CM | POA: Insufficient documentation

## 2020-12-18 DIAGNOSIS — C50211 Malignant neoplasm of upper-inner quadrant of right female breast: Secondary | ICD-10-CM | POA: Insufficient documentation

## 2020-12-18 DIAGNOSIS — Z79899 Other long term (current) drug therapy: Secondary | ICD-10-CM | POA: Diagnosis not present

## 2020-12-18 DIAGNOSIS — Z87891 Personal history of nicotine dependence: Secondary | ICD-10-CM | POA: Diagnosis not present

## 2020-12-18 MED ORDER — GABAPENTIN 300 MG PO CAPS
300.0000 mg | ORAL_CAPSULE | Freq: Every day | ORAL | 4 refills | Status: DC
Start: 2020-12-18 — End: 2021-02-10

## 2020-12-18 NOTE — Progress Notes (Signed)
[place]  [phone] Fax:(336) [phone]     ID: Judy Chapman DOB: 21-Jun-1999  MR#: [id number]  NTZ#:[id number]  Patient Care Team: Crist Infante, MD as PCP - General (Internal Medicine) Deboraha Sprang, MD as PCP - Cardiology (Cardiology) Rockwell Germany, RN as Oncology Nurse Navigator Mauro Kaufmann, RN as Oncology Nurse Navigator Jovita Kussmaul, MD as Consulting Physician (General Surgery) Aldric Wenzler, Virgie Dad, MD as Consulting Physician (Oncology) Eppie Gibson, MD as Attending Physician (Radiation Oncology) Deboraha Sprang, MD as Consulting Physician (Cardiology) Chauncey Cruel, MD OTHER MD:   CHIEF COMPLAINT: estrogen receptor positive breast cancer  CURRENT TREATMENT: anastrozole   INTERVAL HISTORY: [name] returns today for follow up of her estrogen receptor positive breast cancer.   She was switched to anastrozole at her last visit on 10/08/2020.  She reports her most recent bone density screening was in 2019 at [place].  The patient tells me her primary care physician told her he was "stable".  REVIEW OF SYSTEMS: Jalynn is doing much better on anastrozole.  She has minimal hot flashes which do wake her up at night.  She is having no other side effects that she is aware of.  She is however having significant knee problems, is getting some shots in her knees and that is helping a little bit.  A detailed review of systems was otherwise stable.   COVID 19 VACCINATION STATUS: s/p Pfizer x2 followed by booster SEPT   HISTORY OF CURRENT ILLNESS: From the original intake note:  Judy Chapman had routine screening mammography on 02/29/2020 showing a possible abnormality in the right breast. She underwent right diagnostic mammography with tomography and right breast ultrasonography at [facility] on 03/08/2020 showing: breast density category B; suspicious 8 mm mass in right breast at 1 o'clock; right axilla  unremarkable.  Accordingly on 03/11/2020 she proceeded to biopsy of the right breast area in question. The pathology from this procedure ([id number]) showed: invasive and in situ mammary carcinoma, grade 2, e-cadherin negative. Prognostic indicators significant for: estrogen receptor, 90% positive and progesterone receptor, 95% positive, both with strong staining intensity. Proliferation marker Ki67 at 10%. HER2 negative by immunohistochemistry (1+).  The patient's subsequent history is as detailed below.   PAST MEDICAL HISTORY: Past Medical History:  Diagnosis Date  . ACE inhibitor intolerance   . Arthritis   . Asthma    outgrew as a child  . Cancer [facility])    right breast  . Early cataract   . Endometriosis   . Gout   . Heart disease   . Hypertension   . Osteopenia   . Pacemaker-MDT    DOI-2002, Generator change 2010  . Pneumonia   . PVC (premature ventricular contraction)   . Sinus node dysfunction (HCC)   . Wears glasses     PAST SURGICAL HISTORY: Past Surgical History:  Procedure Laterality Date  . BREAST LUMPECTOMY     Benign  . BREAST LUMPECTOMY WITH RADIOACTIVE SEED LOCALIZATION Right 03/27/2020   Procedure: RIGHT BREAST LUMPECTOMY WITH RADIOACTIVE SEED LOCALIZATION;  Surgeon: Jovita Kussmaul, MD;  Location: [place];  Service: General;  Laterality: Right;  . CORONARY ANGIOPLASTY    . DILATION AND CURETTAGE OF UTERUS    . GALLBLADDER SURGERY    . HYSTEROSCOPY    . PACEMAKER INSERTION     Medtronic  . TONSILLECTOMY    . TUBAL LIGATION    . VAGINAL HYSTERECTOMY    . WISDOM  TOOTH EXTRACTION      FAMILY HISTORY: Family History  Problem Relation Age of Onset  . Stomach cancer Paternal Grandfather 74  . Hypertension Mother   . Arrhythmia Neg Hx   . Clotting disorder Neg Hx   . Heart attack Neg Hx   . Fainting Neg Hx   . Anemia Neg Hx   . Heart disease Neg Hx   . Heart failure Neg Hx   . Hyperlipidemia Neg Hx   . Colon cancer Neg Hx    Her father died at age 77  from cirrhosis of the liver. Her mother died at age 10 from pneumonia/sepsis. Judy Chapman has 1 brother. She reports stomach cancer in her paternal grandfather.  There is no other history of breast ovarian or prostate cancer in the family.   GYNECOLOGIC HISTORY:  No LMP recorded. Patient has had a hysterectomy. Menarche: 75 years old Age at first live birth: 75 years old GX P 2 LMP prior to hysterectomy HRT used estradiol pill until breast cancer diagnosis (mid May 2021) Hysterectomy? Yes, in her early 75's BSO? 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 Husband Gerlene Burdock is an Therapist, nutritional and works in [employer]. She lives at home with her husband. Daughter Benancio Deeds, age 38, works as an Wellsite geologist here in [place]. Daughter Mary Sella, age 66, is a Corporate investment banker with [employer] (an investment firm) in [place], [place]. Michol has 4 grandchildren. She is a International aid/development worker.    ADVANCED DIRECTIVES: In the absence of any documentation to the contrary, the patient's spouse is their HCPOA.    HEALTH MAINTENANCE: Social History   Tobacco Use  . Smoking status: Former Smoker    Packs/day: 0.30    Years: 10.00    Pack years: 3.00    Types: Cigarettes    Quit date: 10/26/2005    Years since quitting: 15.1  . Smokeless tobacco: Never Used  Vaping Use  . Vaping Use: Never used  Substance Use Topics  . Alcohol use: Yes    Alcohol/week: 10.0 standard drinks    Types: 10 Glasses of wine per week    Comment: 2 glasses of wine daily  . Drug use: No     Colonoscopy: 10/2012 (Dr. Arlyce Dice)  PAP: 2015  Bone density: 2019   Allergies  Allergen Reactions  . Augmentin [Amoxicillin-Pot Clavulanate]     diarrhea  . Macrodantin [Nitrofurantoin] Other (See Comments)    Sweating and chills  . Ramipril Hives and Swelling         Current Outpatient Medications  Medication Sig Dispense Refill  . allopurinol (ZYLOPRIM) 100 MG  tablet Take 200 mg by mouth daily.    Marland Kitchen anastrozole (ARIMIDEX) 1 MG tablet Take 1 tablet (1 mg total) by mouth daily. 90 tablet 4  . Cholecalciferol (VITAMIN D PO) Take 2,000 Units by mouth daily.     . citalopram (CELEXA) 20 MG tablet Take 1 tablet (20 mg total) by mouth daily. 90 tablet 2  . nebivolol (BYSTOLIC) 10 MG tablet Take 10 mg by mouth daily.     No current facility-administered medications for this visit.    OBJECTIVE: White woman in no acute distress  Vitals:   12/18/20 1549  BP: 131/73  Pulse: 72  Resp: 18  Temp: (!) 97.5 F (36.4 C)  SpO2: 95%     Body mass index is 29.86 kg/m.   Wt Readings from Last 3  Encounters:  12/18/20 176 lb 11.2 oz (80.2 kg)  08/07/20 179 lb 1.6 oz (81.2 kg)  03/27/20 175 lb (79.4 kg)      ECOG FS:1 - Symptomatic but completely ambulatory  Sclerae unicteric, EOMs intact Wearing a mask No cervical or supraclavicular adenopathy Lungs no rales or rhonchi Heart regular rate and rhythm Abd soft, nontender, positive bowel sounds MSK no focal spinal tenderness, no upper extremity lymphedema Neuro: nonfocal, well oriented, appropriate affect Breasts: The right breast is status post lumpectomy (no radiation).  There is no evidence of local recurrence.  The left breast is benign.  Both axillae are benign  LAB RESULTS:  CMP     Component Value Date/Time   NA 141 08/07/2020 1448   K 4.0 08/07/2020 1448   CL 108 08/07/2020 1448   CO2 27 08/07/2020 1448   GLUCOSE 89 08/07/2020 1448   BUN 21 08/07/2020 1448   CREATININE 0.80 08/07/2020 1448   CALCIUM 8.7 (L) 08/07/2020 1448   PROT 6.7 08/07/2020 1448   ALBUMIN 3.6 08/07/2020 1448   AST 24 08/07/2020 1448   ALT 22 08/07/2020 1448   ALKPHOS 49 08/07/2020 1448   BILITOT 0.6 08/07/2020 1448   GFRNONAA >60 08/07/2020 1448   GFRAA >60 03/20/2020 0827    No results found for: TOTALPROTELP, ALBUMINELP, A1GS, A2GS, BETS, BETA2SER, GAMS, MSPIKE, SPEI  Lab Results  Component Value Date    WBC 6.4 08/07/2020   NEUTROABS 3.9 08/07/2020   HGB 12.7 08/07/2020   HCT 38.2 08/07/2020   MCV 97.0 08/07/2020   PLT 194 08/07/2020    No results found for: LABCA2  No components found for: XIPJAS505  No results for input(s): INR in the last 168 hours.  No results found for: LABCA2  No results found for: LZJ673  No results found for: ALP379  No results found for: KWI097  No results found for: CA2729  No components found for: HGQUANT  No results found for: CEA1 / No results found for: CEA1   No results found for: AFPTUMOR  No results found for: CHROMOGRNA  No results found for: KPAFRELGTCHN, LAMBDASER, KAPLAMBRATIO (kappa/lambda light chains)  No results found for: HGBA, HGBA2QUANT, HGBFQUANT, HGBSQUAN (Hemoglobinopathy evaluation)   No results found for: LDH  No results found for: IRON, TIBC, IRONPCTSAT (Iron and TIBC)  No results found for: FERRITIN  Urinalysis    Component Value Date/Time   COLORURINE YELLOW 03/30/2014 1543   APPEARANCEUR CLEAR 03/30/2014 1543   LABSPEC 1.020 03/30/2014 1543   PHURINE 6.5 03/30/2014 1543   GLUCOSEU NEG 03/30/2014 1543   HGBUR TRACE (A) 03/30/2014 1543   BILIRUBINUR NEG 03/30/2014 1543   KETONESUR NEG 03/30/2014 1543   PROTEINUR NEG 03/30/2014 1543   UROBILINOGEN 0.2 03/30/2014 1543   NITRITE NEG 03/30/2014 1543   LEUKOCYTESUR NEG 03/30/2014 1543    STUDIES: No results found.   ELIGIBLE FOR AVAILABLE RESEARCH PROTOCOL: AET  ASSESSMENT: 75 y.o. [place] woman status post right lumpectomy without sentinel lymph node sampling 03/27/2020 for a pT1b cN0, stage IA invasive lobular breast cancer, estrogen and progesterone receptor positive, HER-2 not amplified, with an MIB-1 of 10%.  (1) adjuvant radiation may be safely omitted if the patient takes an antiestrogen for 5 years  (2) tamoxifen started 04/18/2020  (a) status post hysterectomy  (b) discontinued October 2021 after multiple dose reductions did not  resolve symptoms  (3) anastrozole started 10/26/2020  (a) bone density at [facility] 2019   PLAN: Modena has made friends with  anastrozole.  This is an excellent inexpensive drug and that will cut her risk of breast cancer in half.  The plan is to continue it for 5 years.  This will increase the risk of osteoporosis.  She is monitored for this through her primary care physician, Dr. Haynes Kerns, who has a special interest in this issue.  Accordingly we are not following up on this as we would otherwise  The only concern she has is nighttime hot flashes.  I am going to try gabapentin at bedtime and see if she tolerates it well and if it works for her  Otherwise she tells me she will have her mammography in May and see Dr. Marlou Starks shortly after that.  She will see Dr. Haynes Kerns in July.  She will return to see me in November.  She knows to call for any other issue that may develop before then  Total encounter time 25 minutes.Sarajane Jews C. Colby Catanese, MD 12/18/2020 5:18 PM Medical Oncology and Hematology [facility] [place], [place] [address] Tel. [phone]    Fax. [phone]   This document serves as a record of services personally performed by Lurline Del, MD. It was created on his behalf by Wilburn Mylar, a trained medical scribe. The creation of this record is based on the scribe's personal observations and the provider's statements to them.   I, Lurline Del MD, have reviewed the above documentation for accuracy and completeness, and I agree with the above.   *Total Encounter Time as defined by the Centers for Medicare and Medicaid Services includes, in addition to the face-to-face time of a patient visit (documented in the note above) non-face-to-face time: obtaining and reviewing outside history, ordering and reviewing medications, tests or procedures, care coordination (communications with other health care professionals or caregivers) and  documentation in the medical record.

## 2020-12-19 ENCOUNTER — Telehealth: Payer: Self-pay | Admitting: Oncology

## 2020-12-19 NOTE — Telephone Encounter (Signed)
Scheduled appts per 2/23 los. Pt confirmed appt date and time.  

## 2020-12-25 DIAGNOSIS — H2513 Age-related nuclear cataract, bilateral: Secondary | ICD-10-CM | POA: Diagnosis not present

## 2020-12-25 DIAGNOSIS — H35363 Drusen (degenerative) of macula, bilateral: Secondary | ICD-10-CM | POA: Diagnosis not present

## 2021-01-14 ENCOUNTER — Other Ambulatory Visit: Payer: Self-pay

## 2021-01-14 ENCOUNTER — Ambulatory Visit (INDEPENDENT_AMBULATORY_CARE_PROVIDER_SITE_OTHER): Payer: Medicare PPO | Admitting: Internal Medicine

## 2021-01-14 ENCOUNTER — Other Ambulatory Visit: Payer: Self-pay | Admitting: Oncology

## 2021-01-14 ENCOUNTER — Encounter: Payer: Self-pay | Admitting: Internal Medicine

## 2021-01-14 VITALS — BP 154/84 | HR 63 | Ht 64.5 in | Wt 178.2 lb

## 2021-01-14 DIAGNOSIS — I495 Sick sinus syndrome: Secondary | ICD-10-CM | POA: Diagnosis not present

## 2021-01-14 DIAGNOSIS — Z950 Presence of cardiac pacemaker: Secondary | ICD-10-CM | POA: Diagnosis not present

## 2021-01-14 NOTE — Progress Notes (Unsigned)
If she lives 10 years she has a fair chance of developing metastatic breast cancer, despite the seemingly good prognosis (it would be different if this were ductal); these cancers are very slow however and even when metastatic carry a prognosis in years;  I will bring her back in April to discuss observation alone     ASSESSMENT: 75 y.o. [place] woman status post right lumpectomy without sentinel lymph node sampling 03/27/2020 for a pT1b cN0, stage IA invasive lobular breast cancer, estrogen and progesterone receptor positive, HER-2 not amplified, with an MIB-1 of 10%.  (1) adjuvant radiation may be safely omitted if the patient takes an antiestrogen for 5 years  (2) tamoxifen started 04/18/2020             (a) status post hysterectomy             (b) discontinued October 2021 after multiple dose reductions did not resolve symptoms  (3) anastrozole started 10/26/2020             (a) bone density at [facility] [place] 2019

## 2021-01-14 NOTE — Patient Instructions (Signed)

## 2021-01-14 NOTE — Progress Notes (Signed)
Patient Care Team: Crist Infante, MD as PCP - General (Internal Medicine) Deboraha Sprang, MD as PCP - Cardiology (Cardiology) Rockwell Germany, RN as Oncology Nurse Navigator Mauro Kaufmann, RN as Oncology Nurse Navigator Jovita Kussmaul, MD as Consulting Physician (General Surgery) Magrinat, Virgie Dad, MD as Consulting Physician (Oncology) Eppie Gibson, MD as Attending Physician (Radiation Oncology) Deboraha Sprang, MD as Consulting Physician (Cardiology)   HPI  Judy Chapman is a 75 y.o. 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 Doreatha Lew.   .The patient denies chest pain, shortness of breath, nocturnal dyspnea, orthopnea or peripheral edema.  There have been no palpitations, lightheadedness or syncope  Intercurrently has been diagnosed with breast cancer.  Underwent surgery and subsequent hormonal therapy which has been complicated by side effects.      DATE TEST EF   2/06 Echo  65%   7/20 CTA   Ca Score 0             Date Cr K Hgb  6/20 0.7   13.5            Past Medical History:  Diagnosis Date  . ACE inhibitor intolerance   . Arthritis   . Asthma    outgrew as a child  . Cancer [facility])    right breast  . Early cataract   . Endometriosis   . Gout   . Heart disease   . Hypertension   . Osteopenia   . Pacemaker-MDT    DOI-2002, Generator change 2010  . Pneumonia   . PVC (premature ventricular contraction)   . Sinus node dysfunction (HCC)   . Wears glasses     Past Surgical History:  Procedure Laterality Date  . BREAST LUMPECTOMY     Benign  . BREAST LUMPECTOMY WITH RADIOACTIVE SEED LOCALIZATION Right 03/27/2020   Procedure: RIGHT BREAST LUMPECTOMY WITH RADIOACTIVE SEED LOCALIZATION;  Surgeon: Jovita Kussmaul, MD;  Location: [place];  Service: General;  Laterality: Right;  . CORONARY ANGIOPLASTY    . DILATION AND CURETTAGE  OF UTERUS    . GALLBLADDER SURGERY    . HYSTEROSCOPY    . PACEMAKER INSERTION     Medtronic  . TONSILLECTOMY    . TUBAL LIGATION    . VAGINAL HYSTERECTOMY    . WISDOM TOOTH EXTRACTION      Current Outpatient Medications  Medication Sig Dispense Refill  . allopurinol (ZYLOPRIM) 100 MG tablet Take 200 mg by mouth daily.    Marland Kitchen anastrozole (ARIMIDEX) 1 MG tablet Take 1 tablet (1 mg total) by mouth daily. 90 tablet 4  . Cholecalciferol (VITAMIN D PO) Take 2,000 Units by mouth daily.     . citalopram (CELEXA) 20 MG tablet Take 1 tablet (20 mg total) by mouth daily. 90 tablet 2  . gabapentin (NEURONTIN) 300 MG capsule Take 1 capsule (300 mg total) by mouth at bedtime. 90 capsule 4  . nebivolol (BYSTOLIC) 10 MG tablet Take 10 mg by mouth daily.     No current facility-administered medications for this visit.    Allergies  Allergen Reactions  . Augmentin [Amoxicillin-Pot Clavulanate]     diarrhea  . Macrodantin [Nitrofurantoin] Other (See Comments)    Sweating and chills  . Ramipril Hives and Swelling         Review of Systems negative except from HPI and PMH  Physical  Exam BP (!) 154/84   Pulse 63   Ht 5' 4.5" (1.638 m)   Wt 178 lb 3.2 oz (80.8 kg)   SpO2 97%   BMI 30.12 kg/m  Well developed and well nourished in no acute distress HENT normal Neck supple with JVP-flat Clear Device pocket well healed; without hematoma or erythema.  There is no tethering  Regular rate and rhythm, no  murmur Abd-soft with active BS No Clubbing cyanosis   edema Skin-warm and dry A & Oriented  Grossly normal sensory and motor function  ECG sinus at 63 next interval 16/09/41   Assessment and  Plan Hypertension      Sinus node dysfunction  Pacemaker-Medtronic     Subclinical atrial fibrillation   Blood pressure is elevated.  Suggested she follow-up with Dr. Haynes Kerns.  Device function is normal  Subclinical atrial fibrillation was identified with episodes up to 2 hours.  No  indication at this juncture for anticoagulation.  We will continue to monitor.  Lengthy discussion regarding the absolute versus relative benefits of therapy vis--vis the importance of therapy in the context of significant drug related side effects.  Relayed that question to Dr. Jana Hakim

## 2021-01-15 ENCOUNTER — Telehealth: Payer: Self-pay | Admitting: Oncology

## 2021-01-15 NOTE — Telephone Encounter (Signed)
Scheduled appt per 3/22 sch msg. Pt aware.  

## 2021-01-23 DIAGNOSIS — M170 Bilateral primary osteoarthritis of knee: Secondary | ICD-10-CM | POA: Diagnosis not present

## 2021-01-31 ENCOUNTER — Other Ambulatory Visit: Payer: Self-pay | Admitting: Oncology

## 2021-01-31 DIAGNOSIS — Z1231 Encounter for screening mammogram for malignant neoplasm of breast: Secondary | ICD-10-CM

## 2021-01-31 DIAGNOSIS — Z9889 Other specified postprocedural states: Secondary | ICD-10-CM

## 2021-01-31 DIAGNOSIS — Z853 Personal history of malignant neoplasm of breast: Secondary | ICD-10-CM

## 2021-02-09 NOTE — Progress Notes (Signed)
Judy Chapman  [phone] [phone]     ID: Judy Chapman DOB: 05-07-1946  MR#: [id number]  WSF#:[id number]  Patient Care Team: Judy Infante, MD as PCP - General (Internal Medicine) Judy Sprang, MD as PCP - Cardiology (Cardiology) Judy Germany, RN as Oncology Nurse Navigator Judy Kaufmann, RN as Oncology Nurse Navigator Judy Kussmaul, MD as Consulting Physician (General Surgery) Magrinat, Virgie Dad, MD as Consulting Physician (Oncology) Judy Gibson, MD as Attending Physician (Radiation Oncology) Judy Sprang, MD as Consulting Physician (Cardiology) Judy Arabian, MD as Consulting Physician (Orthopedic Surgery) Judy Cruel, MD OTHER MD:  I connected with Judy Chapman on 02/10/21 at 10:00 AM EDT by video enabled telemedicine visit and verified that I am speaking with Judy correct person using two identifiers.   I discussed Judy limitations, risks, security and privacy concerns of performing an evaluation and management service by telemedicine and Judy availability of in-person appointments. I also discussed with Judy patient that there may be a patient responsible charge related to this service. Judy patient expressed understanding and agreed to proceed.   Other persons participating in Judy visit and their role in Judy encounter: None  Patient's location: Home Provider's location: [place]  Total time spent: 15 min   CHIEF COMPLAINT: estrogen receptor positive breast cancer  CURRENT TREATMENT: anastrozole   INTERVAL HISTORY: Judy Chapman was contacted today for follow up of her estrogen receptor positive breast cancer.   She was switched to anastrozole on 10/08/2020.  She is tolerating this well so far.  She was having some nighttime hot flashes issues.  We tried gabapentin for that.  She says that caused her to gain weight so she has stopped it.  She continues on anastrozole and "I cannot tolerate  it".  Vaginal dryness and arthralgias and myalgias are not an issue.  She reports her most recent bone density screening was in 2019 at [facility].  Judy patient tells me her primary care physician told her he was "stable".  She is scheduled for repeat this year.  She is scheduled for routine mammography on 03/19/21.  She will follow-up with Dr. Marlou Chapman in June.   REVIEW OF SYSTEMS: Judy Chapman had a wonderful Easter with her family from [place] present but it was a lot of work and she is now resting.  She has knee problems which keep her from walking long distances but she got herself a stationary bike and is using that on a regular basis.  A detailed review of systems was otherwise stable   COVID 19 VACCINATION STATUS: s/p Pfizer x2 followed by booster SEPT 2021   HISTORY OF CURRENT ILLNESS: From Judy original intake note:  Judy Chapman had routine screening mammography on 02/29/2020 showing a possible abnormality in Judy right breast. She underwent right diagnostic mammography with tomography and right breast ultrasonography at [facility] on 03/08/2020 showing: breast density category B; suspicious 8 mm mass in right breast at 1 o'clock; right axilla unremarkable.  Accordingly on 03/11/2020 she proceeded to biopsy of Judy right breast area in question. Judy pathology from this procedure (Judy [facility]) showed: invasive and in situ mammary carcinoma, grade 2, e-cadherin negative. Prognostic indicators significant for: estrogen receptor, 90% positive and progesterone receptor, 95% positive, both with strong staining intensity. Proliferation marker Ki67 at 10%. HER2 negative by immunohistochemistry (1+).  Judy patient's subsequent history is as detailed below.   PAST MEDICAL HISTORY: Past Medical History:  Diagnosis Date  .  ACE inhibitor intolerance   . Arthritis   . Asthma    outgrew as a child  . Cancer [facility])    right breast  . Early cataract   . Endometriosis   . Gout   . Heart  disease   . Hypertension   . Osteopenia   . Pacemaker-MDT    DOI-2002, Generator change 2010  . Pneumonia   . PVC (premature ventricular contraction)   . Sinus node dysfunction (HCC)   . Wears glasses     PAST SURGICAL HISTORY: Past Surgical History:  Procedure Laterality Date  . BREAST LUMPECTOMY     Benign  . BREAST LUMPECTOMY WITH RADIOACTIVE SEED LOCALIZATION Right 03/27/2020   Procedure: RIGHT BREAST LUMPECTOMY WITH RADIOACTIVE SEED LOCALIZATION;  Surgeon: Judy Kussmaul, MD;  Location: [facility];  Service: General;  Laterality: Right;  . CORONARY ANGIOPLASTY    . DILATION AND CURETTAGE OF UTERUS    . GALLBLADDER SURGERY    . HYSTEROSCOPY    . PACEMAKER INSERTION     [employer]  . TONSILLECTOMY    . TUBAL LIGATION    . VAGINAL HYSTERECTOMY    . WISDOM TOOTH EXTRACTION      FAMILY HISTORY: Family History  Problem Relation Age of Onset  . Stomach cancer Paternal Grandfather 38  . Hypertension Mother   . Arrhythmia Neg Hx   . Clotting disorder Neg Hx   . Heart attack Neg Hx   . Fainting Neg Hx   . Anemia Neg Hx   . Heart disease Neg Hx   . Heart failure Neg Hx   . Hyperlipidemia Neg Hx   . Colon cancer Neg Hx    Her father died at age 49 from cirrhosis of Judy liver. Her mother died at age 77 from pneumonia/sepsis. Judy Chapman has 1 brother. She reports stomach cancer in her paternal grandfather.  There is no other history of breast ovarian or prostate cancer in Judy family.   GYNECOLOGIC HISTORY:  No LMP recorded. Patient has had a hysterectomy. Menarche: 75 years old Age at first live birth: 75 years old [place] P 2 LMP prior to hysterectomy HRT used estradiol pill until breast cancer diagnosis (mid May 2021) Hysterectomy? Yes, in her early 39's BSO? no   SOCIAL HISTORY: (updated 02/2020)  Judy Chapman is currently retired from working as a [employer]. Husband Judy Chapman is an Holiday representative and works in Paediatric nurse. She lives at home with her husband. Daughter Judy Chapman, age 80, works as an Metallurgist here in [place]. Daughter Judy Chapman, age 91, is a Doctor, hospital with [employer] (an investment firm) in [place], [place]. Diamone has 4 grandchildren. She is a Tourist information centre manager.    ADVANCED DIRECTIVES: In Judy absence of any documentation to Judy contrary, Judy patient's spouse is their HCPOA.    HEALTH MAINTENANCE: Social History   Tobacco Use  . Smoking status: Former Smoker    Packs/day: 0.30    Years: 10.00    Pack years: 3.00    Types: Cigarettes    Quit date: 10/26/2005    Years since quitting: 15.3  . Smokeless tobacco: Never Used  Vaping Use  . Vaping Use: Never used  Substance Use Topics  . Alcohol use: Yes    Alcohol/week: 10.0 standard drinks    Types: 10 Glasses of wine per week    Comment: 2 glasses of wine daily  . Drug use: No     Colonoscopy: 10/2012 (Dr. Deatra Ina)  PAP: 2015  Bone density: 2019   Allergies  Allergen Reactions  . Augmentin [Amoxicillin-Pot Clavulanate]     diarrhea  . Macrodantin [Nitrofurantoin] Other (See Comments)    Sweating and chills  . Ramipril Hives and Swelling         Current Outpatient Medications  Medication Sig Dispense Refill  . allopurinol (ZYLOPRIM) 100 MG tablet Take 200 mg by mouth daily.    Marland Kitchen anastrozole (ARIMIDEX) 1 MG tablet Take 1 tablet (1 mg total) by mouth daily. 90 tablet 4  . Cholecalciferol (VITAMIN D PO) Take 2,000 Units by mouth daily.     . citalopram (CELEXA) 20 MG tablet Take 1 tablet (20 mg total) by mouth daily. 90 tablet 2  . gabapentin (NEURONTIN) 300 MG capsule Take 1 capsule (300 mg total) by mouth at bedtime. 90 capsule 4  . nebivolol (BYSTOLIC) 10 MG tablet Take 10 mg by mouth daily.     No current facility-administered medications for this visit.    OBJECTIVE: White woman who appears stated age  There were no vitals filed for this visit.   There is no height or weight on file to calculate BMI.   Wt Readings from Last 3 Encounters:  01/14/21 178 lb 3.2 oz  (80.8 kg)  12/18/20 176 lb 11.2 oz (80.2 kg)  08/07/20 179 lb 1.6 oz (81.2 kg)      ECOG FS:1 - Symptomatic but completely ambulatory  Telemedicine visit 02/10/21   LAB RESULTS:  CMP     Component Value Date/Time   NA 141 08/07/2020 1448   K 4.0 08/07/2020 1448   CL 108 08/07/2020 1448   CO2 27 08/07/2020 1448   GLUCOSE 89 08/07/2020 1448   BUN 21 08/07/2020 1448   CREATININE 0.80 08/07/2020 1448   CALCIUM 8.7 (L) 08/07/2020 1448   PROT 6.7 08/07/2020 1448   ALBUMIN 3.6 08/07/2020 1448   AST 24 08/07/2020 1448   ALT 22 08/07/2020 1448   ALKPHOS 49 08/07/2020 1448   BILITOT 0.6 08/07/2020 1448   GFRNONAA >60 08/07/2020 1448   GFRAA >60 03/20/2020 0827    No results found for: TOTALPROTELP, ALBUMINELP, A1GS, A2GS, BETS, BETA2SER, GAMS, MSPIKE, SPEI  Lab Results  Component Value Date   WBC 6.4 08/07/2020   NEUTROABS 3.9 08/07/2020   HGB 12.7 08/07/2020   HCT 38.2 08/07/2020   MCV 97.0 08/07/2020   PLT 194 08/07/2020    No results found for: LABCA2  No components found for: JDBZMC802  No results for input(s): INR in Judy last 168 hours.  No results found for: LABCA2  No results found for: MVV612  No results found for: AES975  No results found for: PYY511  No results found for: CA2729  No components found for: HGQUANT  No results found for: CEA1 / No results found for: CEA1   No results found for: AFPTUMOR  No results found for: CHROMOGRNA  No results found for: KPAFRELGTCHN, LAMBDASER, KAPLAMBRATIO (kappa/lambda light chains)  No results found for: HGBA, HGBA2QUANT, HGBFQUANT, HGBSQUAN (Hemoglobinopathy evaluation)   No results found for: LDH  No results found for: IRON, TIBC, IRONPCTSAT (Iron and TIBC)  No results found for: FERRITIN  Urinalysis    Component Value Date/Time   COLORURINE YELLOW 03/30/2014 1543   APPEARANCEUR CLEAR 03/30/2014 1543   LABSPEC 1.020 03/30/2014 1543   PHURINE 6.5 03/30/2014 1543   GLUCOSEU NEG 03/30/2014  [place] (A) 03/30/2014 1543   BILIRUBINUR NEG 03/30/2014 1543   KETONESUR NEG 03/30/2014 1543  PROTEINUR NEG 03/30/2014 1543   UROBILINOGEN 0.2 03/30/2014 1543   NITRITE NEG 03/30/2014 1543   LEUKOCYTESUR NEG 03/30/2014 1543    STUDIES: No results found.   ELIGIBLE FOR AVAILABLE RESEARCH PROTOCOL: AET  ASSESSMENT: 75 y.o. [place] woman status post right lumpectomy without sentinel lymph node sampling 03/27/2020 for a pT1b cN0, stage IA invasive lobular breast cancer, estrogen and progesterone receptor positive, HER-2 not amplified, with an MIB-1 of 10%.  (1) adjuvant radiation may be safely omitted if Judy patient takes an antiestrogen for 5 years  (2) tamoxifen started 04/18/2020  (a) status post hysterectomy  (b) discontinued October 2021 after multiple dose reductions did not resolve symptoms  (3) anastrozole started 10/26/2020  (a) bone density at [facility] 2019   PLAN: Crystale continues to tolerate anastrozole well and we will continue that until she has completed her 5 years of antiestrogen therapy.  She will have a repeat bone density through [facility] sometime this year and I have asked her to alert them to send me a copy so that we can put that in our records.  She will have mammography in May and see Dr. Marlou Chapman in June.  She will see me again in November and from that point we will start seeing her on a once a year basis  She knows to call for any other issue that may develop before then.  Virgie Chapman. Mekayla Soman, MD 02/10/2021 10:05 AM Medical Oncology and Hematology [facility] [place], [place] [address] Tel. [phone]    Fax. [phone]   This document serves as a record of services personally performed by Lurline Del, MD. It was created on his behalf by Wilburn Mylar, a trained medical scribe. Judy creation of this record is based on Judy scribe's personal observations and Judy provider's statements to  them.   I, Lurline Del MD, have reviewed Judy above documentation for accuracy and completeness, and I agree with Judy above.   *Total Encounter Time as defined by Judy [facility] [employer] Medicare and Medicaid Services includes, in addition to Judy face-to-face time of a patient visit (documented in Judy note above) non-face-to-face time: obtaining and reviewing outside history, ordering and reviewing medications, tests or procedures, care coordination (communications with other health care professionals or caregivers) and documentation in Judy medical record.

## 2021-02-10 ENCOUNTER — Inpatient Hospital Stay: Payer: Medicare PPO | Attending: Oncology | Admitting: Oncology

## 2021-02-10 DIAGNOSIS — Z87891 Personal history of nicotine dependence: Secondary | ICD-10-CM | POA: Diagnosis not present

## 2021-02-10 DIAGNOSIS — Z7289 Other problems related to lifestyle: Secondary | ICD-10-CM | POA: Diagnosis not present

## 2021-02-10 DIAGNOSIS — Z90710 Acquired absence of both cervix and uterus: Secondary | ICD-10-CM | POA: Insufficient documentation

## 2021-02-10 DIAGNOSIS — Z881 Allergy status to other antibiotic agents status: Secondary | ICD-10-CM | POA: Insufficient documentation

## 2021-02-10 DIAGNOSIS — Z170 Estrogen receptor positive status [ER+]: Secondary | ICD-10-CM | POA: Insufficient documentation

## 2021-02-10 DIAGNOSIS — Z79811 Long term (current) use of aromatase inhibitors: Secondary | ICD-10-CM | POA: Insufficient documentation

## 2021-02-10 DIAGNOSIS — Z880 Allergy status to penicillin: Secondary | ICD-10-CM | POA: Insufficient documentation

## 2021-02-10 DIAGNOSIS — Z8379 Family history of other diseases of the digestive system: Secondary | ICD-10-CM | POA: Diagnosis not present

## 2021-02-10 DIAGNOSIS — Z800 Family history of malignant neoplasm of digestive organs: Secondary | ICD-10-CM | POA: Insufficient documentation

## 2021-02-10 DIAGNOSIS — Z8249 Family history of ischemic heart disease and other diseases of the circulatory system: Secondary | ICD-10-CM | POA: Diagnosis not present

## 2021-02-10 DIAGNOSIS — Z79899 Other long term (current) drug therapy: Secondary | ICD-10-CM | POA: Insufficient documentation

## 2021-02-10 DIAGNOSIS — C50211 Malignant neoplasm of upper-inner quadrant of right female breast: Secondary | ICD-10-CM | POA: Diagnosis not present

## 2021-02-13 ENCOUNTER — Ambulatory Visit (INDEPENDENT_AMBULATORY_CARE_PROVIDER_SITE_OTHER): Payer: Medicare PPO

## 2021-02-13 DIAGNOSIS — I495 Sick sinus syndrome: Secondary | ICD-10-CM | POA: Diagnosis not present

## 2021-02-15 LAB — CUP PACEART REMOTE DEVICE CHECK
Battery Impedance: 1454 Ohm
Battery Remaining Longevity: 51 mo
Battery Voltage: 2.77 V
Brady Statistic AP VP Percent: 1 %
Brady Statistic AP VS Percent: 48 %
Brady Statistic AS VP Percent: 0 %
Brady Statistic AS VS Percent: 51 %
Date Time Interrogation Session: 20220421130519
Implantable Lead Implant Date: 20020701
Implantable Lead Implant Date: 20020701
Implantable Lead Location: 753859
Implantable Lead Location: 753860
Implantable Lead Model: 4469
Implantable Lead Model: 4470
Implantable Lead Serial Number: 313853
Implantable Lead Serial Number: 323286
Implantable Pulse Generator Implant Date: 20100908
Lead Channel Impedance Value: 461 Ohm
Lead Channel Impedance Value: 561 Ohm
Lead Channel Pacing Threshold Amplitude: 0.625 V
Lead Channel Pacing Threshold Amplitude: 0.75 V
Lead Channel Pacing Threshold Pulse Width: 0.4 ms
Lead Channel Pacing Threshold Pulse Width: 0.4 ms
Lead Channel Setting Pacing Amplitude: 2 V
Lead Channel Setting Pacing Amplitude: 2.5 V
Lead Channel Setting Pacing Pulse Width: 0.4 ms
Lead Channel Setting Sensing Sensitivity: 2 mV

## 2021-03-04 NOTE — Progress Notes (Signed)
Remote pacemaker transmission.   

## 2021-03-18 ENCOUNTER — Telehealth: Payer: Self-pay

## 2021-03-18 MED ORDER — TAMOXIFEN CITRATE 10 MG PO TABS: 10.0000 mg | ORAL_TABLET | Freq: Every day | ORAL | 2 refills | Status: DC

## 2021-03-18 NOTE — Telephone Encounter (Signed)
Per MD patient to start Tamoxifen 10mg  daily beginning in June.   RN notified patient, verbalized understanding and agreement.  RN educated on side effects.  Rx sent to pharmacy.

## 2021-03-18 NOTE — Telephone Encounter (Signed)
Patient called to report she has been experiencing severe joint pain, and insomnia past several weeks with anastrozole.    Patient with history of ER + breast cancer.   Patient reports stopping medication 1 week ago due to side effects.  Since stopping medication, insomnia and joint pain have subsided.  Patient states she tolerated Tamoxifen better than anastrozole.   Patient inquiring to see what next steps should be taken.  RN encouraged patient to continue to hold medication for 1 more week.  Will review with MD for additional recommendations.

## 2021-03-19 ENCOUNTER — Other Ambulatory Visit: Payer: Self-pay

## 2021-03-19 ENCOUNTER — Ambulatory Visit
Admission: RE | Admit: 2021-03-19 | Discharge: 2021-03-19 | Disposition: A | Discharge status: Home / Self Care | Payer: Medicare PPO | Source: Ambulatory Visit | Attending: Oncology | Admitting: Oncology

## 2021-03-19 DIAGNOSIS — Z9889 Other specified postprocedural states: Secondary | ICD-10-CM

## 2021-03-19 DIAGNOSIS — Z1231 Encounter for screening mammogram for malignant neoplasm of breast: Secondary | ICD-10-CM

## 2021-03-19 DIAGNOSIS — Z853 Personal history of malignant neoplasm of breast: Secondary | ICD-10-CM

## 2021-03-19 DIAGNOSIS — R922 Inconclusive mammogram: Secondary | ICD-10-CM | POA: Diagnosis not present

## 2021-03-20 DIAGNOSIS — M7989 Other specified soft tissue disorders: Secondary | ICD-10-CM | POA: Diagnosis not present

## 2021-03-20 DIAGNOSIS — M79662 Pain in left lower leg: Secondary | ICD-10-CM | POA: Diagnosis not present

## 2021-03-20 DIAGNOSIS — L03032 Cellulitis of left toe: Secondary | ICD-10-CM | POA: Diagnosis not present

## 2021-03-20 DIAGNOSIS — M79661 Pain in right lower leg: Secondary | ICD-10-CM | POA: Diagnosis not present

## 2021-03-25 ENCOUNTER — Ambulatory Visit (INDEPENDENT_AMBULATORY_CARE_PROVIDER_SITE_OTHER): Payer: Self-pay | Admitting: Podiatry

## 2021-03-25 DIAGNOSIS — Z5329 Procedure and treatment not carried out because of patient's decision for other reasons: Secondary | ICD-10-CM

## 2021-04-01 DIAGNOSIS — C50211 Malignant neoplasm of upper-inner quadrant of right female breast: Secondary | ICD-10-CM | POA: Diagnosis not present

## 2021-04-01 DIAGNOSIS — Z170 Estrogen receptor positive status [ER+]: Secondary | ICD-10-CM | POA: Diagnosis not present

## 2021-04-10 DIAGNOSIS — W268XXA Contact with other sharp object(s), not elsewhere classified, initial encounter: Secondary | ICD-10-CM | POA: Diagnosis not present

## 2021-04-10 DIAGNOSIS — Z23 Encounter for immunization: Secondary | ICD-10-CM | POA: Diagnosis not present

## 2021-04-10 DIAGNOSIS — S61411A Laceration without foreign body of right hand, initial encounter: Secondary | ICD-10-CM | POA: Diagnosis not present

## 2021-05-03 ENCOUNTER — Encounter (HOSPITAL_COMMUNITY): Payer: Self-pay

## 2021-05-12 ENCOUNTER — Telehealth: Payer: Self-pay | Admitting: *Deleted

## 2021-05-12 NOTE — Telephone Encounter (Signed)
This RN returned VM left by pt - and obtained her VM.  Note pt's message on nurse line stated she has tested positive for Covid and was put on antiviral medication per primary MD. She has now broken out with a red rash and has stopped the antiviral per her discussion with primary MD.  She is asking " is there and interaction with tamoxifen and the antiviral medications ?"  This RN could not locate in chart what antivirals have been ordered ( not on medication or reconciliation list ) and per this RN's message on pt's VM informed to call the pharmacy she obtained the antivirals and discuss with them due to missing information that this RN is unable to further advise.  This RN's name and office number given to return call if needed.

## 2021-05-15 ENCOUNTER — Ambulatory Visit (INDEPENDENT_AMBULATORY_CARE_PROVIDER_SITE_OTHER): Payer: Medicare PPO

## 2021-05-15 DIAGNOSIS — I495 Sick sinus syndrome: Secondary | ICD-10-CM

## 2021-05-19 LAB — CUP PACEART REMOTE DEVICE CHECK
Battery Impedance: 1565 Ohm
Battery Remaining Longevity: 47 mo
Battery Voltage: 2.78 V
Brady Statistic AP VP Percent: 1 %
Brady Statistic AP VS Percent: 56 %
Brady Statistic AS VP Percent: 0 %
Brady Statistic AS VS Percent: 43 %
Date Time Interrogation Session: 20220722094822
Implantable Lead Implant Date: 20020701
Implantable Lead Implant Date: 20020701
Implantable Lead Location: 753859
Implantable Lead Location: 753860
Implantable Lead Model: 4469
Implantable Lead Model: 4470
Implantable Lead Serial Number: 313853
Implantable Lead Serial Number: 323286
Implantable Pulse Generator Implant Date: 20100908
Lead Channel Impedance Value: 449 Ohm
Lead Channel Impedance Value: 628 Ohm
Lead Channel Pacing Threshold Amplitude: 0.625 V
Lead Channel Pacing Threshold Amplitude: 0.75 V
Lead Channel Pacing Threshold Pulse Width: 0.4 ms
Lead Channel Pacing Threshold Pulse Width: 0.4 ms
Lead Channel Setting Pacing Amplitude: 2 V
Lead Channel Setting Pacing Amplitude: 2.5 V
Lead Channel Setting Pacing Pulse Width: 0.4 ms
Lead Channel Setting Sensing Sensitivity: 4 mV

## 2021-06-03 DIAGNOSIS — E785 Hyperlipidemia, unspecified: Secondary | ICD-10-CM | POA: Diagnosis not present

## 2021-06-03 DIAGNOSIS — M109 Gout, unspecified: Secondary | ICD-10-CM | POA: Diagnosis not present

## 2021-06-03 DIAGNOSIS — M859 Disorder of bone density and structure, unspecified: Secondary | ICD-10-CM | POA: Diagnosis not present

## 2021-06-06 NOTE — Progress Notes (Signed)
Remote pacemaker transmission.   

## 2021-06-18 DIAGNOSIS — I10 Essential (primary) hypertension: Secondary | ICD-10-CM | POA: Diagnosis not present

## 2021-06-18 DIAGNOSIS — Z0000 Encounter for general adult medical examination without abnormal findings: Secondary | ICD-10-CM | POA: Diagnosis not present

## 2021-06-18 DIAGNOSIS — M1990 Unspecified osteoarthritis, unspecified site: Secondary | ICD-10-CM | POA: Diagnosis not present

## 2021-06-18 DIAGNOSIS — E785 Hyperlipidemia, unspecified: Secondary | ICD-10-CM | POA: Diagnosis not present

## 2021-06-18 DIAGNOSIS — M8580 Other specified disorders of bone density and structure, unspecified site: Secondary | ICD-10-CM | POA: Diagnosis not present

## 2021-06-18 DIAGNOSIS — Z950 Presence of cardiac pacemaker: Secondary | ICD-10-CM | POA: Diagnosis not present

## 2021-06-18 DIAGNOSIS — D7589 Other specified diseases of blood and blood-forming organs: Secondary | ICD-10-CM | POA: Diagnosis not present

## 2021-06-18 DIAGNOSIS — R82998 Other abnormal findings in urine: Secondary | ICD-10-CM | POA: Diagnosis not present

## 2021-06-18 DIAGNOSIS — M109 Gout, unspecified: Secondary | ICD-10-CM | POA: Diagnosis not present

## 2021-06-18 DIAGNOSIS — Z1212 Encounter for screening for malignant neoplasm of rectum: Secondary | ICD-10-CM | POA: Diagnosis not present

## 2021-06-18 DIAGNOSIS — C50919 Malignant neoplasm of unspecified site of unspecified female breast: Secondary | ICD-10-CM | POA: Diagnosis not present

## 2021-06-19 DIAGNOSIS — L03032 Cellulitis of left toe: Secondary | ICD-10-CM | POA: Diagnosis not present

## 2021-07-02 DIAGNOSIS — H2513 Age-related nuclear cataract, bilateral: Secondary | ICD-10-CM | POA: Diagnosis not present

## 2021-07-04 ENCOUNTER — Other Ambulatory Visit: Payer: Self-pay | Admitting: Oncology

## 2021-07-22 DIAGNOSIS — Z23 Encounter for immunization: Secondary | ICD-10-CM | POA: Diagnosis not present

## 2021-07-23 DIAGNOSIS — M170 Bilateral primary osteoarthritis of knee: Secondary | ICD-10-CM | POA: Diagnosis not present

## 2021-07-23 DIAGNOSIS — M179 Osteoarthritis of knee, unspecified: Secondary | ICD-10-CM | POA: Diagnosis not present

## 2021-07-31 DIAGNOSIS — M170 Bilateral primary osteoarthritis of knee: Secondary | ICD-10-CM | POA: Diagnosis not present

## 2021-08-07 DIAGNOSIS — M25562 Pain in left knee: Secondary | ICD-10-CM | POA: Diagnosis not present

## 2021-08-07 DIAGNOSIS — M25561 Pain in right knee: Secondary | ICD-10-CM | POA: Diagnosis not present

## 2021-08-07 DIAGNOSIS — M170 Bilateral primary osteoarthritis of knee: Secondary | ICD-10-CM | POA: Diagnosis not present

## 2021-08-14 ENCOUNTER — Ambulatory Visit (INDEPENDENT_AMBULATORY_CARE_PROVIDER_SITE_OTHER): Payer: Medicare PPO

## 2021-08-14 DIAGNOSIS — I495 Sick sinus syndrome: Secondary | ICD-10-CM | POA: Diagnosis not present

## 2021-08-17 LAB — CUP PACEART REMOTE DEVICE CHECK
Battery Impedance: 1650 Ohm
Battery Remaining Longevity: 45 mo
Battery Voltage: 2.76 V
Brady Statistic AP VP Percent: 1 %
Brady Statistic AP VS Percent: 60 %
Brady Statistic AS VP Percent: 0 %
Brady Statistic AS VS Percent: 39 %
Date Time Interrogation Session: 20221021113526
Implantable Lead Implant Date: 20020701
Implantable Lead Implant Date: 20020701
Implantable Lead Location: 753859
Implantable Lead Location: 753860
Implantable Lead Model: 4469
Implantable Lead Model: 4470
Implantable Lead Serial Number: 313853
Implantable Lead Serial Number: 323286
Implantable Pulse Generator Implant Date: 20100908
Lead Channel Impedance Value: 442 Ohm
Lead Channel Impedance Value: 548 Ohm
Lead Channel Pacing Threshold Amplitude: 0.5 V
Lead Channel Pacing Threshold Amplitude: 0.75 V
Lead Channel Pacing Threshold Pulse Width: 0.4 ms
Lead Channel Pacing Threshold Pulse Width: 0.4 ms
Lead Channel Setting Pacing Amplitude: 2 V
Lead Channel Setting Pacing Amplitude: 2.5 V
Lead Channel Setting Pacing Pulse Width: 0.4 ms
Lead Channel Setting Sensing Sensitivity: 2.8 mV

## 2021-08-19 NOTE — Telephone Encounter (Signed)
No entry 

## 2021-08-21 NOTE — Progress Notes (Signed)
Remote pacemaker transmission.   

## 2021-08-27 ENCOUNTER — Other Ambulatory Visit: Payer: Medicare PPO

## 2021-08-27 ENCOUNTER — Ambulatory Visit: Payer: Medicare PPO | Admitting: Oncology

## 2021-10-14 ENCOUNTER — Other Ambulatory Visit: Payer: Self-pay

## 2021-10-14 DIAGNOSIS — C50211 Malignant neoplasm of upper-inner quadrant of right female breast: Secondary | ICD-10-CM

## 2021-10-14 NOTE — Progress Notes (Signed)
Judy Chapman  [phone] [phone]) [phone]     ID: Judy Chapman DOB: 1945/12/30  MR#: [id number]  XNA#:[id number]  Patient Care Team: Crist Infante, MD as PCP - General (Internal Medicine) Deboraha Sprang, MD as PCP - Cardiology (Cardiology) Rockwell Germany, RN as Oncology Nurse Navigator Mauro Kaufmann, RN as Oncology Nurse Navigator Jovita Kussmaul, MD as Consulting Physician (General Surgery) Karron Goens, Virgie Dad, MD as Consulting Physician (Oncology) Eppie Gibson, MD as Attending Physician (Radiation Oncology) Deboraha Sprang, MD as Consulting Physician (Cardiology) Gaynelle Arabian, MD as Consulting Physician (Orthopedic Surgery) Chauncey Cruel, MD OTHER MD:   CHIEF COMPLAINT: estrogen receptor positive breast cancer  CURRENT TREATMENT: tamoxifen at 10 mg/ d   INTERVAL HISTORY: Judy Chapman returns today for follow up of her estrogen receptor positive breast cancer.   She is taking tamoxifen at a reduced dose, namely 10 mg daily.  She is doing very well with this.  Hot flashes and vaginal wetness are not major concerns.  Since her last visit, she underwent bilateral diagnostic mammography with tomography at [facility] on 03/19/2021 showing: breast density category C; no evidence of malignancy in either breast.  She also underwent repeat bone density screening on 06/03/2021 showing a T-score of -1.1, which is minimally osteopenic.   REVIEW OF SYSTEMS: Judy Chapman has some knee problems which keep her from walking long distances but has an exercise bike at home which she uses just about every morning.  A detailed review of systems today was otherwise stable.   COVID 19 VACCINATION STATUS: s/p Pfizer x2 followed by booster SEPT 2021   HISTORY OF CURRENT ILLNESS: From the original intake note:  Judy Chapman had routine screening mammography on 02/29/2020 showing a possible abnormality in the right breast. She underwent right diagnostic  mammography with tomography and right breast ultrasonography at [facility] on 03/08/2020 showing: breast density category B; suspicious 8 mm mass in right breast at 1 o'clock; right axilla unremarkable.  Accordingly on 03/11/2020 she proceeded to biopsy of the right breast area in question. The pathology from this procedure ([id number]) showed: invasive and in situ mammary carcinoma, grade 2, e-cadherin negative. Prognostic indicators significant for: estrogen receptor, 90% positive and progesterone receptor, 95% positive, both with strong staining intensity. Proliferation marker Ki67 at 10%. HER2 negative by immunohistochemistry (1+).  The patient's subsequent history is as detailed below.   PAST MEDICAL HISTORY: Past Medical History:  Diagnosis Date   ACE inhibitor intolerance    Arthritis    Asthma    outgrew as a child   Cancer ([facility])    right breast   Early cataract    Endometriosis    Gout    Heart disease    Hypertension    Osteopenia    Pacemaker-MDT    DOI-2002, Generator change 2010   Pneumonia    PVC (premature ventricular contraction)    Sinus node dysfunction (HCC)    Wears glasses     PAST SURGICAL HISTORY: Past Surgical History:  Procedure Laterality Date   BREAST LUMPECTOMY     Benign   BREAST LUMPECTOMY WITH RADIOACTIVE SEED LOCALIZATION Right 03/27/2020   Procedure: RIGHT BREAST LUMPECTOMY WITH RADIOACTIVE SEED LOCALIZATION;  Surgeon: Jovita Kussmaul, MD;  Location: [facility];  Service: General;  Laterality: Right;   CORONARY ANGIOPLASTY     DILATION AND CURETTAGE OF UTERUS     GALLBLADDER SURGERY     HYSTEROSCOPY     PACEMAKER INSERTION  Medtronic   TONSILLECTOMY     TUBAL LIGATION     VAGINAL HYSTERECTOMY     WISDOM TOOTH EXTRACTION      FAMILY HISTORY: Family History  Problem Relation Age of Onset   Stomach cancer Paternal Grandfather 39   Hypertension Mother    Arrhythmia Neg Hx    Clotting disorder Neg Hx    Heart attack Neg Hx     Fainting Neg Hx    Anemia Neg Hx    Heart disease Neg Hx    Heart failure Neg Hx    Hyperlipidemia Neg Hx    Colon cancer Neg Hx    Her father died at age 85 from cirrhosis of the liver. Her mother died at age 27 from pneumonia/sepsis. Judy Chapman has 1 brother. She reports stomach cancer in her paternal grandfather.  There is no other history of breast ovarian or prostate cancer in the family.   GYNECOLOGIC HISTORY:  No LMP recorded. Patient has had a hysterectomy. Menarche: 75 years old Age at first live birth: 75 years old Judy Chapman P 2 LMP prior to hysterectomy HRT used estradiol pill until breast cancer diagnosis (mid May 2021) Hysterectomy? Yes, in her early 49's BSO? no   SOCIAL HISTORY: (updated 02/2020)  Judy Chapman is currently retired from working as a [employer]. Husband Delfino Lovett is an Holiday representative and works in Paediatric nurse. She lives at home with her husband. Daughter Charyl Bigger, age 30, works as an Metallurgist here in [place]. Daughter Andris Baumann, age 52, is a Doctor, hospital with [employer] (an investment firm) in [place], [place]. Vala has 4 grandchildren. She is a Tourist information centre manager.    ADVANCED DIRECTIVES: In the absence of any documentation to the contrary, the patient's spouse is their HCPOA.    HEALTH MAINTENANCE: Social History   Tobacco Use   Smoking status: Former    Packs/day: 0.30    Years: 10.00    Pack years: 3.00    Types: Cigarettes    Quit date: 10/26/2005    Years since quitting: 15.9   Smokeless tobacco: Never  Vaping Use   Vaping Use: Never used  Substance Use Topics   Alcohol use: Yes    Alcohol/week: 10.0 standard drinks    Types: 10 Glasses of wine per week    Comment: 2 glasses of wine daily   Drug use: No     Colonoscopy: 10/2012 (Dr. Deatra Ina)  PAP: 2015  Bone density: 2019   Allergies  Allergen Reactions   Augmentin [Amoxicillin-Pot Clavulanate]     diarrhea   Macrodantin [Nitrofurantoin] Other (See Comments)    Sweating and  chills   Ramipril Hives and Swelling         Current Outpatient Medications  Medication Sig Dispense Refill   allopurinol (ZYLOPRIM) 100 MG tablet Take 200 mg by mouth daily.     Cholecalciferol (VITAMIN D PO) Take 2,000 Units by mouth daily.      citalopram (CELEXA) 20 MG tablet Take 1 tablet (20 mg total) by mouth daily. 90 tablet 2   nebivolol (BYSTOLIC) 10 MG tablet Take 10 mg by mouth daily.     tamoxifen (NOLVADEX) 10 MG tablet Take 1 tablet (10 mg total) by mouth daily. 30 tablet 2   No current facility-administered medications for this visit.    OBJECTIVE: White woman who appears stated age  33:   10/15/21 1145  BP: 129/72  Pulse: 83  Resp: 16  Temp: 98.1 F (36.7 C)  SpO2:  97%     Body mass index is 30.45 kg/m.   Wt Readings from Last 3 Encounters:  10/15/21 177 lb 6.4 oz (80.5 kg)  01/14/21 178 lb 3.2 oz (80.8 kg)  12/18/20 176 lb 11.2 oz (80.2 kg)     ECOG FS:1 - Symptomatic but completely ambulatory  Sclerae unicteric, EOMs intact Wearing a mask No cervical or supraclavicular adenopathy Lungs no rales or rhonchi Heart regular rate and rhythm Abd soft, nontender, positive bowel sounds MSK no focal spinal tenderness, no upper extremity lymphedema Neuro: nonfocal, well oriented, appropriate affect Breasts: The right breast is status postlumpectomy and radiation.  There is no evidence of local recurrence.  The left breast is benign.  Both axillae are benign.   LAB RESULTS:  CMP     Component Value Date/Time   NA 142 10/15/2021 1130   K 3.8 10/15/2021 1130   CL 108 10/15/2021 1130   CO2 26 10/15/2021 1130   GLUCOSE 124 (H) 10/15/2021 1130   BUN 19 10/15/2021 1130   CREATININE 0.85 10/15/2021 1130   CALCIUM 8.8 (L) 10/15/2021 1130   PROT 6.7 10/15/2021 1130   ALBUMIN 4.1 10/15/2021 1130   AST 22 10/15/2021 1130   ALT 19 10/15/2021 1130   ALKPHOS 47 10/15/2021 1130   BILITOT 0.7 10/15/2021 1130   GFRNONAA >60 10/15/2021 1130   GFRAA >60  03/20/2020 0827    No results found for: TOTALPROTELP, ALBUMINELP, A1GS, A2GS, BETS, BETA2SER, GAMS, MSPIKE, SPEI  Lab Results  Component Value Date   WBC 5.4 10/15/2021   NEUTROABS 3.3 10/15/2021   HGB 13.1 10/15/2021   HCT 39.7 10/15/2021   MCV 99.0 10/15/2021   PLT 195 10/15/2021    No results found for: LABCA2  No components found for: [id number]  No results for input(s): INR in the last 168 hours.  No results found for: LABCA2  No results found for: ZSM270  No results found for: BEM754  No results found for: GBE010  No results found for: CA2729  No components found for: HGQUANT  No results found for: CEA1 / No results found for: CEA1   No results found for: AFPTUMOR  No results found for: CHROMOGRNA  No results found for: KPAFRELGTCHN, LAMBDASER, KAPLAMBRATIO (kappa/lambda light chains)  No results found for: HGBA, HGBA2QUANT, HGBFQUANT, HGBSQUAN (Hemoglobinopathy evaluation)   No results found for: LDH  No results found for: IRON, TIBC, IRONPCTSAT (Iron and TIBC)  No results found for: FERRITIN  Urinalysis    Component Value Date/Time   COLORURINE YELLOW 03/30/2014 1543   APPEARANCEUR CLEAR 03/30/2014 1543   LABSPEC 1.020 03/30/2014 1543   PHURINE 6.5 03/30/2014 1543   GLUCOSEU NEG 03/30/2014 1543   HGBUR TRACE (A) 03/30/2014 1543   BILIRUBINUR NEG 03/30/2014 1543   KETONESUR NEG 03/30/2014 1543   PROTEINUR NEG 03/30/2014 1543   UROBILINOGEN 0.2 03/30/2014 1543   NITRITE NEG 03/30/2014 1543   LEUKOCYTESUR NEG 03/30/2014 1543    STUDIES: No results found.   ELIGIBLE FOR AVAILABLE RESEARCH PROTOCOL: AET  ASSESSMENT: 75 y.o. [place] woman status post right lumpectomy without sentinel lymph node sampling 03/27/2020 for a pT1b cN0, stage IA invasive lobular breast cancer, estrogen and progesterone receptor positive, HER-2 not amplified, with an MIB-1 of 10%.  (1) adjuvant radiation may be safely omitted if the patient takes an  antiestrogen for 5 years  (2) tamoxifen started 04/18/2020  (a) status post hysterectomy  (b) discontinued October 2021 after multiple dose reductions did not resolve symptoms-- later  resumed at 10 mg/day  (c) bone density 06/03/2021 shows a T score of -1.1   PLAN: Judy Chapman is now 1-1/2 years out from definitive surgery for her breast cancer with no evidence of disease recurrence.  This is very favorable.  She is doing much better on tamoxifen at 10 mg daily.  The plan is to continue at that dose until she completes her minimum of 5 years.  Her bone density was favorable.  I commended her exercise program.  She lives in my neighborhood and I sometimes see her also walking her dog.  Her next mammogram will be due in May.  She usually sees Dr. Marlou Starks after that.  Accordingly she will return to see [place] in October.  She knows to call for any other issue that may develop before then.  Total encounter time 20 minutes.Sarajane Jews C. Aloha Bartok, MD 10/15/2021 1:04 PM Medical Oncology and Hematology [facility] [place], [place] [address] Tel. [phone]    Fax. [phone]   This document serves as a record of services personally performed by Lurline Del, MD. It was created on his behalf by Wilburn Mylar, a trained medical scribe. The creation of this record is based on the scribe's personal observations and the provider's statements to them.   I, Lurline Del MD, have reviewed the above documentation for accuracy and completeness, and I agree with the above.   *Total Encounter Time as defined by the Centers for Medicare and Medicaid Services includes, in addition to the face-to-face time of a patient visit (documented in the note above) non-face-to-face time: obtaining and reviewing outside history, ordering and reviewing medications, tests or procedures, care coordination (communications with other health care professionals or caregivers) and  documentation in the medical record.

## 2021-10-15 ENCOUNTER — Inpatient Hospital Stay: Payer: Medicare PPO | Attending: Oncology

## 2021-10-15 ENCOUNTER — Other Ambulatory Visit: Payer: Self-pay

## 2021-10-15 ENCOUNTER — Inpatient Hospital Stay: Payer: Medicare PPO | Admitting: Oncology

## 2021-10-15 VITALS — BP 129/72 | HR 83 | Temp 98.1°F | Resp 16 | Ht 64.0 in | Wt 177.4 lb

## 2021-10-15 DIAGNOSIS — Z90710 Acquired absence of both cervix and uterus: Secondary | ICD-10-CM | POA: Diagnosis not present

## 2021-10-15 DIAGNOSIS — C50211 Malignant neoplasm of upper-inner quadrant of right female breast: Secondary | ICD-10-CM | POA: Insufficient documentation

## 2021-10-15 DIAGNOSIS — Z880 Allergy status to penicillin: Secondary | ICD-10-CM | POA: Diagnosis not present

## 2021-10-15 DIAGNOSIS — Z8249 Family history of ischemic heart disease and other diseases of the circulatory system: Secondary | ICD-10-CM | POA: Diagnosis not present

## 2021-10-15 DIAGNOSIS — Z170 Estrogen receptor positive status [ER+]: Secondary | ICD-10-CM

## 2021-10-15 DIAGNOSIS — M8580 Other specified disorders of bone density and structure, unspecified site: Secondary | ICD-10-CM | POA: Insufficient documentation

## 2021-10-15 DIAGNOSIS — Z79810 Long term (current) use of selective estrogen receptor modulators (SERMs): Secondary | ICD-10-CM | POA: Insufficient documentation

## 2021-10-15 DIAGNOSIS — Z79899 Other long term (current) drug therapy: Secondary | ICD-10-CM | POA: Diagnosis not present

## 2021-10-15 DIAGNOSIS — Z800 Family history of malignant neoplasm of digestive organs: Secondary | ICD-10-CM | POA: Insufficient documentation

## 2021-10-15 DIAGNOSIS — Z881 Allergy status to other antibiotic agents status: Secondary | ICD-10-CM | POA: Insufficient documentation

## 2021-10-15 LAB — CMP (CANCER CENTER ONLY)
ALT: 19 U/L (ref 0–44)
AST: 22 U/L (ref 15–41)
Albumin: 4.1 g/dL (ref 3.5–5.0)
Alkaline Phosphatase: 47 U/L (ref 38–126)
Anion gap: 8 (ref 5–15)
BUN: 19 mg/dL (ref 8–23)
CO2: 26 mmol/L (ref 22–32)
Calcium: 8.8 mg/dL — ABNORMAL LOW (ref 8.9–10.3)
Chloride: 108 mmol/L (ref 98–111)
Creatinine: 0.85 mg/dL (ref 0.44–1.00)
GFR, Estimated: >60 mL/min (ref >60)
Glucose, Bld: 124 mg/dL — ABNORMAL HIGH (ref 70–99)
Potassium: 3.8 mmol/L (ref 3.5–5.1)
Sodium: 142 mmol/L (ref 135–145)
Total Bilirubin: 0.7 mg/dL (ref 0.3–1.2)
Total Protein: 6.7 g/dL (ref 6.5–8.1)

## 2021-10-15 LAB — CBC WITH DIFFERENTIAL (CANCER CENTER ONLY)
Abs Immature Granulocytes: 0.01 10*3/uL (ref 0.00–0.07)
Basophils Absolute: 0.1 10*3/uL (ref 0.0–0.1)
Basophils Relative: 2 %
Eosinophils Absolute: 0.1 10*3/uL (ref 0.0–0.5)
Eosinophils Relative: 2 %
HCT: 39.7 % (ref 36.0–46.0)
Hemoglobin: 13.1 g/dL (ref 12.0–15.0)
Immature Granulocytes: 0 %
Lymphocytes Relative: 25 %
Lymphs Abs: 1.3 10*3/uL (ref 0.7–4.0)
MCH: 32.7 pg (ref 26.0–34.0)
MCHC: 33 g/dL (ref 30.0–36.0)
MCV: 99 fL (ref 80.0–100.0)
Monocytes Absolute: 0.6 10*3/uL (ref 0.1–1.0)
Monocytes Relative: 11 %
Neutro Abs: 3.3 10*3/uL (ref 1.7–7.7)
Neutrophils Relative %: 60 %
Platelet Count: 195 10*3/uL (ref 150–400)
RBC: 4.01 MIL/uL (ref 3.87–5.11)
RDW: 12.8 % (ref 11.5–15.5)
WBC Count: 5.4 10*3/uL (ref 4.0–10.5)
nRBC: 0 % (ref 0.0–0.2)

## 2021-10-20 ENCOUNTER — Other Ambulatory Visit: Payer: Self-pay | Admitting: Oncology

## 2021-10-22 ENCOUNTER — Telehealth: Payer: Self-pay | Admitting: Oncology

## 2021-10-22 NOTE — Telephone Encounter (Signed)
Scheduled appointment per 12/21 los. Left message.

## 2021-10-30 DIAGNOSIS — C50411 Malignant neoplasm of upper-outer quadrant of right female breast: Secondary | ICD-10-CM | POA: Diagnosis not present

## 2021-10-30 DIAGNOSIS — Z170 Estrogen receptor positive status [ER+]: Secondary | ICD-10-CM | POA: Diagnosis not present

## 2021-11-13 ENCOUNTER — Ambulatory Visit (INDEPENDENT_AMBULATORY_CARE_PROVIDER_SITE_OTHER): Payer: Medicare PPO

## 2021-11-13 DIAGNOSIS — I495 Sick sinus syndrome: Secondary | ICD-10-CM | POA: Diagnosis not present

## 2021-11-14 LAB — CUP PACEART REMOTE DEVICE CHECK
Battery Impedance: 1792 Ohm
Battery Remaining Longevity: 41 mo
Battery Voltage: 2.76 V
Brady Statistic AP VP Percent: 1 %
Brady Statistic AP VS Percent: 63 %
Brady Statistic AS VP Percent: 0 %
Brady Statistic AS VS Percent: 36 %
Date Time Interrogation Session: 20230120095303
Implantable Lead Implant Date: 20020701
Implantable Lead Implant Date: 20020701
Implantable Lead Location: 753859
Implantable Lead Location: 753860
Implantable Lead Model: 4469
Implantable Lead Model: 4470
Implantable Lead Serial Number: 313853
Implantable Lead Serial Number: 323286
Implantable Pulse Generator Implant Date: 20100908
Lead Channel Impedance Value: 450 Ohm
Lead Channel Impedance Value: 601 Ohm
Lead Channel Pacing Threshold Amplitude: 0.625 V
Lead Channel Pacing Threshold Amplitude: 0.625 V
Lead Channel Pacing Threshold Pulse Width: 0.4 ms
Lead Channel Pacing Threshold Pulse Width: 0.4 ms
Lead Channel Setting Pacing Amplitude: 2 V
Lead Channel Setting Pacing Amplitude: 2.5 V
Lead Channel Setting Pacing Pulse Width: 0.4 ms
Lead Channel Setting Sensing Sensitivity: 2.8 mV

## 2021-11-26 NOTE — Progress Notes (Signed)
Remote pacemaker transmission.   

## 2022-01-20 ENCOUNTER — Encounter: Payer: Medicare PPO | Admitting: Internal Medicine

## 2022-01-20 DIAGNOSIS — Z950 Presence of cardiac pacemaker: Secondary | ICD-10-CM

## 2022-01-20 DIAGNOSIS — I495 Sick sinus syndrome: Secondary | ICD-10-CM

## 2022-02-12 ENCOUNTER — Ambulatory Visit (INDEPENDENT_AMBULATORY_CARE_PROVIDER_SITE_OTHER): Payer: Medicare PPO

## 2022-02-12 DIAGNOSIS — I495 Sick sinus syndrome: Secondary | ICD-10-CM | POA: Diagnosis not present

## 2022-02-12 LAB — CUP PACEART REMOTE DEVICE CHECK
Battery Impedance: 1879 Ohm
Battery Remaining Longevity: 39 mo
Battery Voltage: 2.76 V
Brady Statistic AP VP Percent: 1 %
Brady Statistic AP VS Percent: 65 %
Brady Statistic AS VP Percent: 0 %
Brady Statistic AS VS Percent: 34 %
Date Time Interrogation Session: 20230420094329
Implantable Lead Implant Date: 20020701
Implantable Lead Implant Date: 20020701
Implantable Lead Location: 753859
Implantable Lead Location: 753860
Implantable Lead Model: 4469
Implantable Lead Model: 4470
Implantable Lead Serial Number: 313853
Implantable Lead Serial Number: 323286
Implantable Pulse Generator Implant Date: 20100908
Lead Channel Impedance Value: 462 Ohm
Lead Channel Impedance Value: 590 Ohm
Lead Channel Pacing Threshold Amplitude: 0.625 V
Lead Channel Pacing Threshold Amplitude: 0.75 V
Lead Channel Pacing Threshold Pulse Width: 0.4 ms
Lead Channel Pacing Threshold Pulse Width: 0.4 ms
Lead Channel Setting Pacing Amplitude: 2 V
Lead Channel Setting Pacing Amplitude: 2.5 V
Lead Channel Setting Pacing Pulse Width: 0.4 ms
Lead Channel Setting Sensing Sensitivity: 2.8 mV

## 2022-02-17 ENCOUNTER — Other Ambulatory Visit: Payer: Self-pay | Admitting: Hematology and Oncology

## 2022-02-17 DIAGNOSIS — Z853 Personal history of malignant neoplasm of breast: Secondary | ICD-10-CM

## 2022-02-25 ENCOUNTER — Encounter: Payer: Medicare PPO | Admitting: Internal Medicine

## 2022-02-27 NOTE — Progress Notes (Signed)
Remote pacemaker transmission.   

## 2022-03-05 DIAGNOSIS — M170 Bilateral primary osteoarthritis of knee: Secondary | ICD-10-CM | POA: Diagnosis not present

## 2022-03-12 DIAGNOSIS — M170 Bilateral primary osteoarthritis of knee: Secondary | ICD-10-CM | POA: Diagnosis not present

## 2022-03-12 DIAGNOSIS — M1711 Unilateral primary osteoarthritis, right knee: Secondary | ICD-10-CM | POA: Diagnosis not present

## 2022-03-12 DIAGNOSIS — M1712 Unilateral primary osteoarthritis, left knee: Secondary | ICD-10-CM | POA: Diagnosis not present

## 2022-03-18 ENCOUNTER — Ambulatory Visit
Admission: RE | Admit: 2022-03-18 | Discharge: 2022-03-18 | Disposition: A | Discharge status: Home / Self Care | Payer: Medicare PPO | Source: Ambulatory Visit | Attending: Internal Medicine | Admitting: Internal Medicine

## 2022-03-18 ENCOUNTER — Ambulatory Visit: Payer: Medicare PPO | Admitting: Internal Medicine

## 2022-03-18 ENCOUNTER — Encounter: Payer: Self-pay | Admitting: Internal Medicine

## 2022-03-18 ENCOUNTER — Other Ambulatory Visit: Payer: Medicare PPO | Admitting: *Deleted

## 2022-03-18 VITALS — BP 140/70 | HR 81 | Ht 64.0 in | Wt 175.6 lb

## 2022-03-18 DIAGNOSIS — Z950 Presence of cardiac pacemaker: Secondary | ICD-10-CM | POA: Diagnosis not present

## 2022-03-18 DIAGNOSIS — I495 Sick sinus syndrome: Secondary | ICD-10-CM

## 2022-03-18 DIAGNOSIS — R0609 Other forms of dyspnea: Secondary | ICD-10-CM

## 2022-03-18 NOTE — Patient Instructions (Signed)
Medication Instructions:  Your physician recommends that you continue on your current medications as directed. Please refer to the Current Medication list given to you today.  *If you need a refill on your cardiac medications before your next appointment, please call your pharmacy*   Lab Work: BNP today If you have labs (blood work) drawn today and your tests are completely normal, you will receive your results only by: Maili (if you have MyChart) OR A paper copy in the mail If you have any lab test that is abnormal or we need to change your treatment, we will call you to review the results.   Testing/Procedures: A chest x-ray takes a picture of the organs and structures inside the chest, including the heart, lungs, and blood vessels. This test can show several things, including, whether the heart is enlarges; whether fluid is building up in the lungs; and whether pacemaker / defibrillator leads are still in place.   Your physician has requested that you have an echocardiogram. Echocardiography is a painless test that uses sound waves to create images of your heart. It provides your doctor with information about the size and shape of your heart and how well your heart's chambers and valves are working. This procedure takes approximately one hour. There are no restrictions for this procedure.    Follow-Up: At [facility], you and your health needs are our priority.  As part of our continuing mission to provide you with exceptional heart care, we have created designated Provider Care Teams.  These Care Teams include your primary Cardiologist (physician) and Advanced Practice Providers (APPs -  Physician Assistants and Nurse Practitioners) who all work together to provide you with the care you need, when you need it.  We recommend signing up for the patient portal called "MyChart".  Sign up information is provided on this After Visit Summary.  MyChart is used to connect with patients  for Virtual Visits (Telemedicine).  Patients are able to view lab/test results, encounter notes, upcoming appointments, etc.  Non-urgent messages can be sent to your provider as well.   To learn more about what you can do with MyChart, go to [email or web address].    Your next appointment:   12 months with Dr Caryl Comes  Important Information About Sugar

## 2022-03-18 NOTE — Progress Notes (Signed)
Patient Care Team: Crist Infante, MD as PCP - General (Internal Medicine) Deboraha Sprang, MD as PCP - Cardiology (Cardiology) Jovita Kussmaul, MD as Consulting Physician (General Surgery) Eppie Gibson, MD as Attending Physician (Radiation Oncology) Deboraha Sprang, MD as Consulting Physician (Cardiology) Gaynelle Arabian, MD as Consulting Physician (Orthopedic Surgery) Nicholas Lose, MD as Consulting Physician (Hematology and Oncology)   HPI  Judy Chapman is a 76 y.o. 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 Doreatha Lew.   .The patient denies chest pain, shortness of breath, nocturnal dyspnea, orthopnea or peripheral edema.  There have been no palpitations, lightheadedness or syncope  Intercurrently has been diagnosed with breast cancer.  Underwent surgery and subsequent hormonal therapy which has been complicated by side effects.      DATE TEST EF   2/06 Echo  65% LVH 13/13  7/20 CTA   Ca Score 0             Date Cr K Hgb  6/20 0.7   13.5   12/22 0.85 3.8 13.1      Past Medical History:  Diagnosis Date   ACE inhibitor intolerance    Arthritis    Asthma    outgrew as a child   Cancer ([facility])    right breast   Early cataract    Endometriosis    Gout    Heart disease    Hypertension    Osteopenia    Pacemaker-MDT    DOI-2002, Generator change 2010   Pneumonia    PVC (premature ventricular contraction)    Sinus node dysfunction (HCC)    Wears glasses     Past Surgical History:  Procedure Laterality Date   BREAST LUMPECTOMY     Benign   BREAST LUMPECTOMY WITH RADIOACTIVE SEED LOCALIZATION Right 03/27/2020   Procedure: RIGHT BREAST LUMPECTOMY WITH RADIOACTIVE SEED LOCALIZATION;  Surgeon: Jovita Kussmaul, MD;  Location: [facility];  Service: General;  Laterality: Right;   CORONARY ANGIOPLASTY     DILATION AND CURETTAGE OF UTERUS      GALLBLADDER SURGERY     HYSTEROSCOPY     PACEMAKER INSERTION     Medtronic   TONSILLECTOMY     TUBAL LIGATION     VAGINAL HYSTERECTOMY     WISDOM TOOTH EXTRACTION      Current Outpatient Medications  Medication Sig Dispense Refill   allopurinol (ZYLOPRIM) 100 MG tablet Take 200 mg by mouth daily.     Cholecalciferol (VITAMIN D PO) Take 2,000 Units by mouth daily.      citalopram (CELEXA) 20 MG tablet Take 1 tablet (20 mg total) by mouth daily. 90 tablet 2   nebivolol (BYSTOLIC) 10 MG tablet Take 10 mg by mouth daily.     tamoxifen (NOLVADEX) 10 MG tablet TAKE ONE TABLET BY MOUTH DAILY 90 tablet 3   No current facility-administered medications for this visit.    Allergies  Allergen Reactions   Augmentin [Amoxicillin-Pot Clavulanate]     diarrhea   Allopurinol     Other reaction(s): Other (See Comments), rash   Anastrozole     Other reaction(s): couldn't sleep, Other (See Comments)   Febuxostat Other (See Comments)    Other reaction(s): made her itch. urine smelled bad. felt better off of it. but could go back on it if needed she says   Nitrofurantoin Other (See Comments)  Sweating and chills Other reaction(s): achey, clammy. can take it but feels bad on it. Other reaction(s): Other (See Comments) Sweating and chills Sweating and chills    Pravastatin     Other reaction(s): felt miserable on it.felt hot and sweaty in the night.   Ramipril Hives and Swelling      Other reaction(s): dr. Donneta Romberg wanted it changed due to some allergy issues like swelling., Other (See Comments)   Thiazide-Type Diuretics     Other reaction(s): stop due to gout   Molnupiravir Rash    Other reaction(s): rash    Review of Systems negative except from HPI and PMH  Physical Exam BP 140/70   Pulse 81   Ht '5\' 4"'$  (1.626 m)   Wt 175 lb 9.6 oz (79.7 kg)   SpO2 91%   BMI 30.14 kg/m  Well developed and well nourished in no acute distress HENT normal Neck supple with JVP-flat Bibasilar  crackles halfway up Device pocket well healed; without hematoma or erythema.  There is no tethering  Regular rate and rhythm, no   gallop No murmur Abd-soft with active BS No Clubbing cyanosis   edema Skin-warm and dry A & Oriented  Grossly normal sensory and motor function  ECG atrial pacing at 81 Intervals 20/09/38 Low voltage   Assessment and  Plan Hypertension      Sinus node dysfunction  Dyspnea on exertion/abnormal lung exam  Pacemaker-Medtronic     Subclinical atrial fibrillation  Low voltage   Blood pressure remains borderline elevated.  For right now we will hold, her dyspnea is impressive.  Bibasilar crackles which are newly reported.  We will get a chest x-ray and check a BNP although there is no other evidence of volume overload.  Her echocardiogram 15 years ago had demonstrated left ventricular hypertrophy, there is low voltage on her ECG and this discordance raises the possibility of other things like amyloid.  Her shortness of breath was temporally related to the use of steroids for poison ivy increasing the likelihood of volume overload in the context of diastolic dysfunction there may be enough of this to be a trigger.

## 2022-03-19 DIAGNOSIS — R0600 Dyspnea, unspecified: Secondary | ICD-10-CM | POA: Diagnosis not present

## 2022-03-19 DIAGNOSIS — R051 Acute cough: Secondary | ICD-10-CM | POA: Diagnosis not present

## 2022-03-19 DIAGNOSIS — M170 Bilateral primary osteoarthritis of knee: Secondary | ICD-10-CM | POA: Diagnosis not present

## 2022-03-19 DIAGNOSIS — R9389 Abnormal findings on diagnostic imaging of other specified body structures: Secondary | ICD-10-CM | POA: Diagnosis not present

## 2022-03-19 LAB — PRO B NATRIURETIC PEPTIDE: NT-Pro BNP: 627 pg/mL (ref 0–738)

## 2022-03-24 ENCOUNTER — Telehealth: Payer: Self-pay

## 2022-03-24 NOTE — Telephone Encounter (Signed)
Spoke with pt and advised of abnormal chest xray.  Pt is aware and has seen PCP who has provided pt with steroid pack and steroidal inhaler.  She is to follow up in one month.

## 2022-03-24 NOTE — Telephone Encounter (Signed)
-----   Message from Deboraha Sprang, MD sent at 03/21/2022  4:03 PM EDT ----- Please Inform Patient that CXR  is abnormal and will require further eval  pt has been seen by PCP  Thanks

## 2022-03-25 ENCOUNTER — Ambulatory Visit
Admission: RE | Admit: 2022-03-25 | Discharge: 2022-03-25 | Disposition: A | Discharge status: Home / Self Care | Payer: Medicare PPO | Source: Ambulatory Visit | Attending: Hematology and Oncology | Admitting: Hematology and Oncology

## 2022-03-25 DIAGNOSIS — Z853 Personal history of malignant neoplasm of breast: Secondary | ICD-10-CM

## 2022-03-25 DIAGNOSIS — R922 Inconclusive mammogram: Secondary | ICD-10-CM | POA: Diagnosis not present

## 2022-04-03 ENCOUNTER — Ambulatory Visit (HOSPITAL_COMMUNITY): Payer: Medicare PPO | Attending: Internal Medicine

## 2022-04-03 DIAGNOSIS — R0609 Other forms of dyspnea: Secondary | ICD-10-CM | POA: Diagnosis not present

## 2022-04-05 LAB — ECHOCARDIOGRAM COMPLETE
AV Vena cont: 0.46 cm
Area-P 1/2: 3.72 cm2
P 1/2 time: 439 msec
S' Lateral: 2.9 cm

## 2022-04-17 DIAGNOSIS — M170 Bilateral primary osteoarthritis of knee: Secondary | ICD-10-CM | POA: Diagnosis not present

## 2022-05-08 DIAGNOSIS — C50411 Malignant neoplasm of upper-outer quadrant of right female breast: Secondary | ICD-10-CM | POA: Diagnosis not present

## 2022-05-08 DIAGNOSIS — Z170 Estrogen receptor positive status [ER+]: Secondary | ICD-10-CM | POA: Diagnosis not present

## 2022-05-14 ENCOUNTER — Ambulatory Visit (INDEPENDENT_AMBULATORY_CARE_PROVIDER_SITE_OTHER): Payer: Medicare PPO

## 2022-05-14 DIAGNOSIS — I495 Sick sinus syndrome: Secondary | ICD-10-CM | POA: Diagnosis not present

## 2022-05-15 LAB — CUP PACEART REMOTE DEVICE CHECK
Battery Impedance: 2065 Ohm
Battery Remaining Longevity: 37 mo
Battery Voltage: 2.74 V
Brady Statistic AP VP Percent: 0 %
Brady Statistic AP VS Percent: 41 %
Brady Statistic AS VP Percent: 0 %
Brady Statistic AS VS Percent: 58 %
Date Time Interrogation Session: 20230721121229
Implantable Lead Implant Date: 20020701
Implantable Lead Implant Date: 20020701
Implantable Lead Location: 753859
Implantable Lead Location: 753860
Implantable Lead Model: 4469
Implantable Lead Model: 4470
Implantable Lead Serial Number: 313853
Implantable Lead Serial Number: 323286
Implantable Pulse Generator Implant Date: 20100908
Lead Channel Impedance Value: 460 Ohm
Lead Channel Impedance Value: 563 Ohm
Lead Channel Pacing Threshold Amplitude: 0.5 V
Lead Channel Pacing Threshold Amplitude: 0.75 V
Lead Channel Pacing Threshold Pulse Width: 0.4 ms
Lead Channel Pacing Threshold Pulse Width: 0.4 ms
Lead Channel Setting Pacing Amplitude: 2 V
Lead Channel Setting Pacing Amplitude: 2.5 V
Lead Channel Setting Pacing Pulse Width: 0.4 ms
Lead Channel Setting Sensing Sensitivity: 2.8 mV

## 2022-06-04 ENCOUNTER — Ambulatory Visit: Payer: Medicare PPO | Admitting: Emergency Medicine

## 2022-06-04 ENCOUNTER — Encounter: Payer: Self-pay | Admitting: Emergency Medicine

## 2022-06-04 DIAGNOSIS — R9389 Abnormal findings on diagnostic imaging of other specified body structures: Secondary | ICD-10-CM | POA: Diagnosis not present

## 2022-06-04 DIAGNOSIS — R053 Chronic cough: Secondary | ICD-10-CM | POA: Diagnosis not present

## 2022-06-04 DIAGNOSIS — J849 Interstitial pulmonary disease, unspecified: Secondary | ICD-10-CM | POA: Insufficient documentation

## 2022-06-04 NOTE — Assessment & Plan Note (Signed)
She is experiencing persistent upper airway irritation and some mucus that she has to clear in the morning.  Not really much cough.  Globus sensation through the day.  Her dyspnea is improved.  Question whether her Memory Dance is a contributor to the upper airway irritation, will stop this to see how she tolerates.  Also try starting an antihistamine to see if this helps with the mucus burden.  She does not have a lot of congestion, nasal drainage.  Could add nasal steroid at some point going forward.  She denies any GERD so I will hold off on a PPI but this may be beneficial in the future as well.

## 2022-06-04 NOTE — Assessment & Plan Note (Signed)
Note made of some interstitial prominence on her chest x-ray from May as well as prior films.  We will perform a high-resolution CT scan of the chest to better characterize.

## 2022-06-04 NOTE — Addendum Note (Signed)
Addended by: Gavin Potters R on: 06/04/2022 04:49 PM   Modules accepted: Orders

## 2022-06-04 NOTE — Patient Instructions (Signed)
Please stop your Breo.  If you miss this medication or if your breathing worsens please call our office to let us know. Start loratadine 10 mg once daily (generic Claritin) We will perform a high-resolution CT scan of the chest Follow Dr. Lamonte Sakai next available after your CT scan so we can review the results together.

## 2022-06-04 NOTE — Progress Notes (Signed)
Subjective:    Patient ID: Judy Chapman, female    DOB: [birth year], 76 y.o.   MRN: [id number]  HPI 76 year old woman with a former minimal tobacco history.  She has a history of childhood asthma, hypertension, pacemaker, mild hyperlipidemia, breast cancer with right lumpectomy and tamoxifen 02/2020.  She has been experiencing some progressive shortness of breath on exertion, associated with cough since late March after she was treated with a course steroids for poison ivey. She has had a cardiology evaluation by Dr. Caryl Comes and her pacemaker is working appropriately, echocardiogram and EKG 04/03/2022 reassuring.  Part of her evaluation has included chest x-ray that showed some interstitial prominence at the bases.  She was started empirically on Breo beginning in May, was also given another round of steroids.  She is referred today for further evaluation. She describes some increased chest congestion, clear mucous especially in her throat. She was also having some SOB, improved after another course steroids in May. She believes that the breo may be helping  - at least she feels a bit better. Still some residual throat clearing.    Review of Systems As per HPI  Past Medical History:  Diagnosis Date   ACE inhibitor intolerance    Arthritis    Asthma    outgrew as a child   Cancer ([place])    right breast   Early cataract    Endometriosis    Gout    Heart disease    Hypertension    Osteopenia    Pacemaker-MDT    DOI-2002, Generator change 2010   Pneumonia    PVC (premature ventricular contraction)    Sinus node dysfunction (HCC)    Wears glasses      Family History  Problem Relation Age of Onset   Stomach cancer Paternal Grandfather 25   Hypertension Mother    Arrhythmia Neg Hx    Clotting disorder Neg Hx    Heart attack Neg Hx    Fainting Neg Hx    Anemia Neg Hx    Heart disease Neg Hx    Heart failure Neg Hx    Hyperlipidemia Neg Hx    Colon cancer Neg Hx      Social  History   Socioeconomic History   Marital status: Married    Spouse name: Not on file   Number of children: Not on file   Years of education: Not on file   Highest education level: Not on file  Occupational History   Not on file  Tobacco Use   Smoking status: Former    Packs/day: 0.30    Years: 10.00    Total pack years: 3.00    Types: Cigarettes    Quit date: 10/26/2005    Years since quitting: 16.6   Smokeless tobacco: Never  Vaping Use   Vaping Use: Never used  Substance and Sexual Activity   Alcohol use: Yes    Alcohol/week: 10.0 standard drinks of alcohol    Types: 10 Glasses of wine per week    Comment: 2 glasses of wine daily   Drug use: No   Sexual activity: Yes    Birth control/protection: Surgical    Comment: INTERCOURSE AGE 43 , LESS THAN 5 SEXUAL PARTNERS  Other Topics Concern   Not on file  Social History Narrative   Not on file   Social Determinants of Health   Financial Resource Strain: Not on file  Food Insecurity: Not on file  Transportation Needs: Not on  file  Physical Activity: Not on file  Stress: Not on file  Social Connections: Not on file  Intimate Partner Violence: Not on file     Allergies  Allergen Reactions   Augmentin [Amoxicillin-Pot Clavulanate]     diarrhea   Allopurinol     Other reaction(s): Other (See Comments), rash   Anastrozole     Other reaction(s): couldn't sleep, Other (See Comments)   Febuxostat Other (See Comments)    Other reaction(s): made her itch. urine smelled bad. felt better off of it. but could go back on it if needed she says   Nitrofurantoin Other (See Comments)    Sweating and chills Other reaction(s): achey, clammy. can take it but feels bad on it. Other reaction(s): Other (See Comments) Sweating and chills Sweating and chills    Pravastatin     Other reaction(s): felt miserable on it.felt hot and sweaty in the night.   Ramipril Hives and Swelling      Other reaction(s): dr. Donneta Romberg wanted it  changed due to some allergy issues like swelling., Other (See Comments)   Thiazide-Type Diuretics     Other reaction(s): stop due to gout   Molnupiravir Rash    Other reaction(s): rash     Outpatient Medications Prior to Visit  Medication Sig Dispense Refill   allopurinol (ZYLOPRIM) 100 MG tablet Take 200 mg by mouth daily.     Cholecalciferol (VITAMIN D PO) Take 2,000 Units by mouth daily.      citalopram (CELEXA) 20 MG tablet Take 1 tablet (20 mg total) by mouth daily. 90 tablet 2   nebivolol (BYSTOLIC) 10 MG tablet Take 10 mg by mouth daily.     tamoxifen (NOLVADEX) 10 MG tablet TAKE ONE TABLET BY MOUTH DAILY 90 tablet 3   BREO ELLIPTA 100-25 MCG/ACT AEPB Inhale 1 puff into the lungs daily.     No facility-administered medications prior to visit.         Objective:   Physical Exam Vitals:   06/04/22 1550  BP: 124/70  Pulse: 74  Temp: 98.8 F (37.1 C)  TempSrc: Oral  SpO2: 96%  Weight: 179 lb 6.4 oz (81.4 kg)  Height: '5\' 4"'$  (1.626 m)   Gen: Pleasant, well-nourished, in no distress,  normal affect  ENT: No lesions,  mouth clear,  oropharynx clear, no postnasal drip  Neck: No JVD, no stridor  Lungs: No use of accessory muscles, right basilar inspiratory crackles, otherwise clear  Cardiovascular: RRR, heart sounds normal, no murmur or gallops, no peripheral edema  Musculoskeletal: No deformities, no cyanosis or clubbing  Neuro: alert, awake, non focal  Skin: Warm, no lesions or rash      Assessment & Plan:  Cough She is experiencing persistent upper airway irritation and some mucus that she has to clear in the morning.  Not really much cough.  Globus sensation through the day.  Her dyspnea is improved.  Question whether her Memory Dance is a contributor to the upper airway irritation, will stop this to see how she tolerates.  Also try starting an antihistamine to see if this helps with the mucus burden.  She does not have a lot of congestion, nasal drainage.  Could add  nasal steroid at some point going forward.  She denies any GERD so I will hold off on a PPI but this may be beneficial in the future as well.  Abnormal chest x-ray Note made of some interstitial prominence on her chest x-ray from May as well as prior  films.  We will perform a high-resolution CT scan of the chest to better characterize.   Baltazar Apo, MD, PhD 06/04/2022, 4:23 PM [facility] [phone] or if no answer before 7:00PM call [phone] For any issues after 7:00PM please call eLink [phone]

## 2022-06-08 NOTE — Progress Notes (Signed)
Remote pacemaker transmission.   

## 2022-06-15 ENCOUNTER — Ambulatory Visit (HOSPITAL_BASED_OUTPATIENT_CLINIC_OR_DEPARTMENT_OTHER)
Admission: RE | Admit: 2022-06-15 | Discharge: 2022-06-15 | Disposition: A | Discharge status: Home / Self Care | Payer: Medicare PPO | Source: Ambulatory Visit | Attending: Emergency Medicine | Admitting: Emergency Medicine

## 2022-06-15 DIAGNOSIS — R053 Chronic cough: Secondary | ICD-10-CM | POA: Diagnosis not present

## 2022-06-15 DIAGNOSIS — J984 Other disorders of lung: Secondary | ICD-10-CM | POA: Diagnosis not present

## 2022-06-23 ENCOUNTER — Ambulatory Visit: Payer: Medicare PPO | Admitting: Emergency Medicine

## 2022-06-23 ENCOUNTER — Encounter: Payer: Self-pay | Admitting: Emergency Medicine

## 2022-06-23 DIAGNOSIS — R053 Chronic cough: Secondary | ICD-10-CM

## 2022-06-23 DIAGNOSIS — J849 Interstitial pulmonary disease, unspecified: Secondary | ICD-10-CM

## 2022-06-23 NOTE — Assessment & Plan Note (Addendum)
Newly identified with some subtle peripheral interstitial change in early UIP pattern.  Question whether this is related to her COVID from 1 year ago.  She has never been on amiodarone.  No history of autoimmune disease although we will check autoimmune labs now.  She has a rash on the back of her neck that has not been fully characterized, plan to go to dermatology.  Question whether this could be related.  Discussed pulmonary function testing with her today but she wants to hold off for now.  We will perform lab work today. Follow-up with your dermatologist as planned We will plan to repeat your high-resolution CT scan of the chest in August 2024.  Let us know if your breathing worsens in any way.  If so we will probably repeat your CT scan in early. Follow with Dr. Lamonte Sakai in 12 months or sooner if you have any problems.

## 2022-06-23 NOTE — Patient Instructions (Addendum)
We will perform lab work today. Follow-up with your dermatologist as planned We will plan to repeat your high-resolution CT scan of the chest in August 2024.  Let us know if your breathing worsens in any way.  If so we will probably repeat your CT scan in early. We will not restart Breo at this time Please continue loratadine 10 mg once daily. Follow with Dr. Lamonte Sakai in 12 months or sooner if you have any problems.

## 2022-06-23 NOTE — Assessment & Plan Note (Signed)
Upper airway irritation and dyspnea are both improved.  She feels back to baseline.  Wants to avoid PFT for now.  She did not miss the Breo.  Continue loratadine and follow.

## 2022-06-23 NOTE — Progress Notes (Signed)
Subjective:    Patient ID: Judy Chapman, female    DOB: October 01, 1946, 76 y.o.   MRN: [id number]  HPI 76 year old woman with a former minimal tobacco history.  She has a history of childhood asthma, hypertension, pacemaker, mild hyperlipidemia, breast cancer with right lumpectomy and tamoxifen 02/2020.  She has been experiencing some progressive shortness of breath on exertion, associated with cough since late March after she was treated with a course steroids for poison ivey. She has had a cardiology evaluation by Dr. Caryl Comes and her pacemaker is working appropriately, echocardiogram and EKG 04/03/2022 reassuring.  Part of her evaluation has included chest x-ray that showed some interstitial prominence at the bases.  She was started empirically on Breo beginning in May, was also given another round of steroids.  She is referred today for further evaluation. She describes some increased chest congestion, clear mucous especially in her throat. She was also having some SOB, improved after another course steroids in May. She believes that the breo may be helping  - at least she feels a bit better. Still some residual throat clearing.    ROV 06/23/22 --follow-up visit for 75 year old woman with history of childhood asthma.  I saw her a month ago in the setting of some progressive shortness of breath.  She had had a reassuring cardiology evaluation, noted her pacemaker was working appropriately.  Chest x-ray showed a possible interstitial prominence.  She described her symptoms as cough, upper airway irritation, globus sensation.  She had been started empirically on Breo, I stopped it to see if there would be an improvement in her throat symptoms.  I also performed a CT scan of the chest to evaluate her interstitial prominence as below. She has not missed the [facility], feels that her throat sx and SOB are both better. She had COVID x 1, over a year ago.   High-resolution CT scan of the chest 06/15/2022 reviewed by  me, shows very mild peripheral predominant groundglass attenuation and septal thickening with some subpleural reticulation and mild cylindrical bronchiectatic change.  No frank honeycomb.  Suggestive of early UIP pattern   Review of Systems As per HPI  Past Medical History:  Diagnosis Date   ACE inhibitor intolerance    Arthritis    Asthma    outgrew as a child   Cancer ([place])    right breast   Early cataract    Endometriosis    Gout    Heart disease    Hypertension    Osteopenia    Pacemaker-MDT    DOI-2002, Generator change 2010   Pneumonia    PVC (premature ventricular contraction)    Sinus node dysfunction (HCC)    Wears glasses      Family History  Problem Relation Age of Onset   Stomach cancer Paternal Grandfather 70   Hypertension Mother    Arrhythmia Neg Hx    Clotting disorder Neg Hx    Heart attack Neg Hx    Fainting Neg Hx    Anemia Neg Hx    Heart disease Neg Hx    Heart failure Neg Hx    Hyperlipidemia Neg Hx    Colon cancer Neg Hx      Social History   Socioeconomic History   Marital status: Married    Spouse name: Not on file   Number of children: Not on file   Years of education: Not on file   Highest education level: Not on file  Occupational History  Not on file  Tobacco Use   Smoking status: Former    Packs/day: 0.30    Years: 10.00    Total pack years: 3.00    Types: Cigarettes    Quit date: 10/26/2005    Years since quitting: 16.6   Smokeless tobacco: Never  Vaping Use   Vaping Use: Never used  Substance and Sexual Activity   Alcohol use: Yes    Alcohol/week: 10.0 standard drinks of alcohol    Types: 10 Glasses of wine per week    Comment: 2 glasses of wine daily   Drug use: No   Sexual activity: Yes    Birth control/protection: Surgical    Comment: INTERCOURSE AGE 42 , LESS THAN 5 SEXUAL PARTNERS  Other Topics Concern   Not on file  Social History Narrative   Not on file   Social Determinants of Health   Financial  Resource Strain: Not on file  Food Insecurity: Not on file  Transportation Needs: Not on file  Physical Activity: Not on file  Stress: Not on file  Social Connections: Not on file  Intimate Partner Violence: Not on file     Allergies  Allergen Reactions   Augmentin [Amoxicillin-Pot Clavulanate]     diarrhea   Allopurinol     Other reaction(s): Other (See Comments), rash   Anastrozole     Other reaction(s): couldn't sleep, Other (See Comments)   Febuxostat Other (See Comments)    Other reaction(s): made her itch. urine smelled bad. felt better off of it. but could go back on it if needed she says   Nitrofurantoin Other (See Comments)    Sweating and chills Other reaction(s): achey, clammy. can take it but feels bad on it. Other reaction(s): Other (See Comments) Sweating and chills Sweating and chills    Pravastatin     Other reaction(s): felt miserable on it.felt hot and sweaty in the night.   Ramipril Hives and Swelling      Other reaction(s): dr. Donneta Romberg wanted it changed due to some allergy issues like swelling., Other (See Comments)   Thiazide-Type Diuretics     Other reaction(s): stop due to gout   Molnupiravir Rash    Other reaction(s): rash     Outpatient Medications Prior to Visit  Medication Sig Dispense Refill   allopurinol (ZYLOPRIM) 100 MG tablet Take 200 mg by mouth daily.     Cholecalciferol (VITAMIN D PO) Take 2,000 Units by mouth daily.      citalopram (CELEXA) 20 MG tablet Take 1 tablet (20 mg total) by mouth daily. 90 tablet 2   nebivolol (BYSTOLIC) 10 MG tablet Take 10 mg by mouth daily.     tamoxifen (NOLVADEX) 10 MG tablet TAKE ONE TABLET BY MOUTH DAILY 90 tablet 3   BREO ELLIPTA 100-25 MCG/ACT AEPB Inhale 1 puff into the lungs daily. (Patient not taking: Reported on 06/23/2022)     No facility-administered medications prior to visit.         Objective:   Physical Exam Vitals:   06/23/22 0958  BP: 128/74  Pulse: 74  Temp: 97.6 F (36.4 C)   TempSrc: Oral  SpO2: 98%  Weight: 177 lb (80.3 kg)  Height: '5\' 4"'$  (1.626 m)   Gen: Pleasant, well-nourished, in no distress,  normal affect  ENT: No lesions,  mouth clear,  oropharynx clear, no postnasal drip  Neck: No JVD, no stridor  Lungs: No use of accessory muscles, right basilar inspiratory crackles, otherwise clear  Cardiovascular: RRR, heart  sounds normal, no murmur or gallops, no peripheral edema  Musculoskeletal: No deformities, no cyanosis or clubbing  Neuro: alert, awake, non focal  Skin: Warm, she has a rigid circumferential rash with areas of dryness on the posterior neck, no overt scaling      Assessment & Plan:  ILD (interstitial lung disease) ([place]) Newly identified with some subtle peripheral interstitial change in early UIP pattern.  Question whether this is related to her COVID from 1 year ago.  She has never been on amiodarone.  No history of autoimmune disease although we will check autoimmune labs now.  She has a rash on the back of her neck that has not been fully characterized, plan to go to dermatology.  Question whether this could be related.  Discussed pulmonary function testing with her today but she wants to hold off for now.  We will perform lab work today. Follow-up with your dermatologist as planned We will plan to repeat your high-resolution CT scan of the chest in August 2024.  Let us know if your breathing worsens in any way.  If so we will probably repeat your CT scan in early. Follow with Dr. Lamonte Sakai in 12 months or sooner if you have any problems.   Cough Upper airway irritation and dyspnea are both improved.  She feels back to baseline.  Wants to avoid PFT for now.  She did not miss the Breo.  Continue loratadine and follow.   Baltazar Apo, MD, PhD 06/23/2022, 10:28 AM [facility] [phone] or if no answer before 7:00PM call [phone] For any issues after 7:00PM please call eLink [phone]

## 2022-06-26 LAB — HYPERSENSITIVITY PNEUMONITIS
A. Pullulans Abs: NEGATIVE
A.Fumigatus #1 Abs: NEGATIVE
Micropolyspora faeni, IgG: NEGATIVE
Pigeon Serum Abs: NEGATIVE
Thermoact. Saccharii: NEGATIVE
Thermoactinomyces vulgaris, IgG: NEGATIVE

## 2022-06-29 LAB — ANA,IFA RA DIAG PNL W/RFLX TIT/PATN
Anti Nuclear Antibody (ANA): NEGATIVE
Cyclic Citrullin Peptide Ab: <16 UNITS
Rheumatoid fact SerPl-aCnc: 32 IU/mL — ABNORMAL HIGH (ref <14)

## 2022-06-29 LAB — SJOGREN'S SYNDROME ANTIBODS(SSA + SSB)
SSA (Ro) (ENA) Antibody, IgG: <1 AI
SSB (La) (ENA) Antibody, IgG: <1 AI

## 2022-06-29 LAB — ANCA SCREEN W REFLEX TITER: ANCA SCREEN: NEGATIVE

## 2022-07-06 ENCOUNTER — Telehealth: Payer: Self-pay | Admitting: Emergency Medicine

## 2022-07-06 DIAGNOSIS — J849 Interstitial pulmonary disease, unspecified: Secondary | ICD-10-CM

## 2022-07-06 DIAGNOSIS — R768 Other specified abnormal immunological findings in serum: Secondary | ICD-10-CM

## 2022-07-06 NOTE — Telephone Encounter (Signed)
Collene Gobble, MD  07/01/2022  5:26 PM EDT     Please let the patient know that her rheumatoid factor is slightly elevated from her screening labs done at her last office visit.  Clinical significance is unclear, but it could relate to rheumatoid disease including arthritis or lung inflammation.  We could refer her to discuss with rheumatology if she would like [place] to do so.     Called and spoke with pt letting her know the results of bloodwork and recs per RB. Pt verbalized understanding and stated to go ahead and send referral to rheumatology. Referral placed. Nothing further needed.

## 2022-07-08 DIAGNOSIS — M170 Bilateral primary osteoarthritis of knee: Secondary | ICD-10-CM | POA: Diagnosis not present

## 2022-07-09 LAB — MYOMARKER 3 PLUS PROFILE (RDL)

## 2022-07-20 DIAGNOSIS — H2513 Age-related nuclear cataract, bilateral: Secondary | ICD-10-CM | POA: Diagnosis not present

## 2022-07-21 DIAGNOSIS — L239 Allergic contact dermatitis, unspecified cause: Secondary | ICD-10-CM | POA: Diagnosis not present

## 2022-07-22 DIAGNOSIS — M109 Gout, unspecified: Secondary | ICD-10-CM | POA: Diagnosis not present

## 2022-07-22 DIAGNOSIS — R7989 Other specified abnormal findings of blood chemistry: Secondary | ICD-10-CM | POA: Diagnosis not present

## 2022-07-22 DIAGNOSIS — Z79899 Other long term (current) drug therapy: Secondary | ICD-10-CM | POA: Diagnosis not present

## 2022-07-22 DIAGNOSIS — I10 Essential (primary) hypertension: Secondary | ICD-10-CM | POA: Diagnosis not present

## 2022-07-22 DIAGNOSIS — E785 Hyperlipidemia, unspecified: Secondary | ICD-10-CM | POA: Diagnosis not present

## 2022-07-26 DIAGNOSIS — Z1212 Encounter for screening for malignant neoplasm of rectum: Secondary | ICD-10-CM | POA: Diagnosis not present

## 2022-07-30 NOTE — Progress Notes (Signed)
Patient Care Team: Crist Infante, MD as PCP - General (Internal Medicine) Deboraha Sprang, MD as PCP - Cardiology (Cardiology) Jovita Kussmaul, MD as Consulting Physician (General Surgery) Eppie Gibson, MD as Attending Physician (Radiation Oncology) Deboraha Sprang, MD as Consulting Physician (Cardiology) Gaynelle Arabian, MD as Consulting Physician (Orthopedic Surgery) Nicholas Lose, MD as Consulting Physician (Hematology and Oncology)  DIAGNOSIS:  Encounter Diagnosis  Name Primary?   Malignant neoplasm of upper-inner quadrant of right breast in female, estrogen receptor positive ([place])     SUMMARY OF ONCOLOGIC HISTORY: Oncology History  Malignant neoplasm of upper-inner quadrant of right breast in female, estrogen receptor positive ([place])  03/14/2020 Initial Diagnosis   Malignant neoplasm of upper-inner quadrant of right breast in female, estrogen receptor positive ([place])   03/27/2020 Cancer Staging   Staging form: Breast, AJCC 8th Edition - Pathologic stage from 03/27/2020: Stage IA (pT1b, pN0, cM0, G2, ER+, PR+, HER2-) - Signed by Gardenia Phlegm, NP on 04/10/2020     CHIEF COMPLIANT: Follow-up breast cancer surveillance Establish oncology care with Dr. Lindi Adie   INTERVAL HISTORY: Judy Chapman is a 76 y.o. with the above-mentioned breast cancer surveillance. Establish oncology care with Dr. Lindi Adie. She states that she is tolerating the tamoxifen. She had tolerable hot flashes. Denies any other symptoms.     ALLERGIES:  is allergic to augmentin [amoxicillin-pot clavulanate], allopurinol, anastrozole, febuxostat, nitrofurantoin, pravastatin, ramipril, thiazide-type diuretics, and molnupiravir.  MEDICATIONS:  Current Outpatient Medications  Medication Sig Dispense Refill   allopurinol (ZYLOPRIM) 100 MG tablet Take 200 mg by mouth daily.     Cholecalciferol (VITAMIN D PO) Take 2,000 Units by mouth daily.      citalopram (CELEXA) 20 MG tablet Take 1 tablet (20 mg  total) by mouth daily. 90 tablet 2   nebivolol (BYSTOLIC) 10 MG tablet Take 10 mg by mouth daily.     tamoxifen (NOLVADEX) 10 MG tablet Take 1 tablet (10 mg total) by mouth daily. 90 tablet 3   No current facility-administered medications for this visit.    PHYSICAL EXAMINATION: ECOG PERFORMANCE STATUS: 1 - Symptomatic but completely ambulatory  Vitals:   08/04/22 1145  BP: 135/77  Pulse: 75  Resp: 18  Temp: 97.7 F (36.5 C)  SpO2: 100%   Filed Weights   08/04/22 1145  Weight: 172 lb 14.4 oz (78.4 kg)    BREAST: No palpable masses or nodules in either right or left breasts. No palpable axillary supraclavicular or infraclavicular adenopathy no breast tenderness or nipple discharge. (exam performed in the presence of a chaperone)  LABORATORY DATA:  I have reviewed the data as listed    Latest Ref Rng & Units 10/15/2021   11:30 AM 08/07/2020    2:48 PM 03/20/2020    8:27 AM  CMP  Glucose 70 - 99 mg/dL 124  89  94   BUN 8 - 23 mg/dL _0 Creatinine 0.44 - 1.00 mg/dL 0.85  0.80  0.76   Sodium 135 - 145 mmol/L 142  141  143   Potassium 3.5 - 5.1 mmol/L 3.8  4.0  4.0   Chloride 98 - 111 mmol/L 108  108  108   CO2 22 - 32 mmol/L _1 Calcium 8.9 - 10.3 mg/dL 8.8  8.7  8.4   Total Protein 6.5 - 8.1 g/dL 6.7  6.7  6.6   Total Bilirubin 0.3 - 1.2 mg/dL 0.7  0.6  0.6  Alkaline Phos 38 - 126 U/L 47  49  53   AST 15 - 41 U/L _0 ALT 0 - 44 U/L _1 Lab Results  Component Value Date   WBC 5.6 08/04/2022   HGB 13.4 08/04/2022   HCT 39.5 08/04/2022   MCV 98.5 08/04/2022   PLT 179 08/04/2022   NEUTROABS 3.6 08/04/2022    ASSESSMENT & PLAN:  Malignant neoplasm of upper-inner quadrant of right breast in female, estrogen receptor positive ([place]) Dr. Jana Hakim patient establishing oncology care with me 03/27/2020: Right lumpectomy: T1BN0 stage Ia invasive lobular cancer, ER/PR positive HER2 negative Ki-67 10% Did not do adjuvant  radiation Current treatment: Tamoxifen started 04/18/2020: Could not tolerate the full dose.  Currently on 10 mg daily.  Breast cancer surveillance: 1.  Breast exam 08/04/2022: Benign 2. mammogram 03/25/2022: Benign breast density category C 3.  06/17/2022: High-resolution CT chest without contrast: Findings compatible with interstitial lung disease and hepatic steatosis  Her husband had prostate cancer and is doing well. Return to clinic in 1 year for follow-up    No orders of the defined types were placed in this encounter.  The patient has a good understanding of the overall plan. she agrees with it. she will call with any problems that may develop before the next visit here. Total time spent: 30 mins including face to face time and time spent for planning, charting and co-ordination of care   Harriette Ohara, MD 08/04/22    I Gardiner Coins am scribing for Dr. Lindi Adie  I have reviewed the above documentation for accuracy and completeness, and I agree with the above.

## 2022-08-03 ENCOUNTER — Other Ambulatory Visit: Payer: Self-pay | Admitting: *Deleted

## 2022-08-03 DIAGNOSIS — Z170 Estrogen receptor positive status [ER+]: Secondary | ICD-10-CM

## 2022-08-04 ENCOUNTER — Other Ambulatory Visit: Payer: Self-pay

## 2022-08-04 ENCOUNTER — Inpatient Hospital Stay: Payer: Medicare PPO | Attending: Hematology and Oncology | Admitting: Hematology and Oncology

## 2022-08-04 ENCOUNTER — Inpatient Hospital Stay: Payer: Medicare PPO

## 2022-08-04 DIAGNOSIS — C50211 Malignant neoplasm of upper-inner quadrant of right female breast: Secondary | ICD-10-CM

## 2022-08-04 DIAGNOSIS — R232 Flushing: Secondary | ICD-10-CM | POA: Diagnosis not present

## 2022-08-04 DIAGNOSIS — Z79899 Other long term (current) drug therapy: Secondary | ICD-10-CM | POA: Diagnosis not present

## 2022-08-04 DIAGNOSIS — Z881 Allergy status to other antibiotic agents status: Secondary | ICD-10-CM | POA: Insufficient documentation

## 2022-08-04 DIAGNOSIS — Z880 Allergy status to penicillin: Secondary | ICD-10-CM | POA: Diagnosis not present

## 2022-08-04 DIAGNOSIS — Z170 Estrogen receptor positive status [ER+]: Secondary | ICD-10-CM | POA: Diagnosis not present

## 2022-08-04 LAB — CMP (CANCER CENTER ONLY)
ALT: 23 U/L (ref 0–44)
AST: 21 U/L (ref 15–41)
Albumin: 3.9 g/dL (ref 3.5–5.0)
Alkaline Phosphatase: 39 U/L (ref 38–126)
Anion gap: 3 — ABNORMAL LOW (ref 5–15)
BUN: 23 mg/dL (ref 8–23)
CO2: 30 mmol/L (ref 22–32)
Calcium: 8.8 mg/dL — ABNORMAL LOW (ref 8.9–10.3)
Chloride: 108 mmol/L (ref 98–111)
Creatinine: 0.91 mg/dL (ref 0.44–1.00)
GFR, Estimated: >60 mL/min (ref >60)
Glucose, Bld: 94 mg/dL (ref 70–99)
Potassium: 4.1 mmol/L (ref 3.5–5.1)
Sodium: 141 mmol/L (ref 135–145)
Total Bilirubin: 0.6 mg/dL (ref 0.3–1.2)
Total Protein: 6.5 g/dL (ref 6.5–8.1)

## 2022-08-04 LAB — CBC WITH DIFFERENTIAL (CANCER CENTER ONLY)
Abs Immature Granulocytes: 0.01 10*3/uL (ref 0.00–0.07)
Basophils Absolute: 0.1 10*3/uL (ref 0.0–0.1)
Basophils Relative: 1 %
Eosinophils Absolute: 0.1 10*3/uL (ref 0.0–0.5)
Eosinophils Relative: 2 %
HCT: 39.5 % (ref 36.0–46.0)
Hemoglobin: 13.4 g/dL (ref 12.0–15.0)
Immature Granulocytes: 0 %
Lymphocytes Relative: 22 %
Lymphs Abs: 1.2 10*3/uL (ref 0.7–4.0)
MCH: 33.4 pg (ref 26.0–34.0)
MCHC: 33.9 g/dL (ref 30.0–36.0)
MCV: 98.5 fL (ref 80.0–100.0)
Monocytes Absolute: 0.6 10*3/uL (ref 0.1–1.0)
Monocytes Relative: 11 %
Neutro Abs: 3.6 10*3/uL (ref 1.7–7.7)
Neutrophils Relative %: 64 %
Platelet Count: 179 10*3/uL (ref 150–400)
RBC: 4.01 MIL/uL (ref 3.87–5.11)
RDW: 13.2 % (ref 11.5–15.5)
WBC Count: 5.6 10*3/uL (ref 4.0–10.5)
nRBC: 0 % (ref 0.0–0.2)

## 2022-08-04 MED ORDER — TAMOXIFEN CITRATE 10 MG PO TABS: 10.0000 mg | ORAL_TABLET | Freq: Every day | ORAL | 3 refills | Status: DC

## 2022-08-04 NOTE — Assessment & Plan Note (Addendum)
Dr. Jana Chapman patient establishing oncology care with me 03/27/2020: Right lumpectomy: T1BN0 stage Ia invasive lobular cancer, ER/PR positive HER2 negative Ki-67 10% Did not do adjuvant radiation Current treatment: Tamoxifen started 04/18/2020: Could not tolerate the full dose.  Currently on 10 mg daily.  Breast cancer surveillance: 1.  Breast exam 08/04/2022: Benign 2. mammogram 03/25/2022: Benign breast density category C 3.  06/17/2022: High-resolution CT chest without contrast: Findings compatible with interstitial lung disease and hepatic steatosis  Return to clinic in 1 year for follow-up

## 2022-08-05 ENCOUNTER — Telehealth: Payer: Self-pay | Admitting: Hematology and Oncology

## 2022-08-05 NOTE — Telephone Encounter (Signed)
Scheduled appointment per 10/10 los. Patient is aware.

## 2022-08-06 DIAGNOSIS — R9389 Abnormal findings on diagnostic imaging of other specified body structures: Secondary | ICD-10-CM | POA: Diagnosis not present

## 2022-08-06 DIAGNOSIS — Z950 Presence of cardiac pacemaker: Secondary | ICD-10-CM | POA: Diagnosis not present

## 2022-08-06 DIAGNOSIS — I10 Essential (primary) hypertension: Secondary | ICD-10-CM | POA: Diagnosis not present

## 2022-08-06 DIAGNOSIS — C50919 Malignant neoplasm of unspecified site of unspecified female breast: Secondary | ICD-10-CM | POA: Diagnosis not present

## 2022-08-06 DIAGNOSIS — Z1331 Encounter for screening for depression: Secondary | ICD-10-CM | POA: Diagnosis not present

## 2022-08-06 DIAGNOSIS — R82998 Other abnormal findings in urine: Secondary | ICD-10-CM | POA: Diagnosis not present

## 2022-08-06 DIAGNOSIS — Z1339 Encounter for screening examination for other mental health and behavioral disorders: Secondary | ICD-10-CM | POA: Diagnosis not present

## 2022-08-06 DIAGNOSIS — D7589 Other specified diseases of blood and blood-forming organs: Secondary | ICD-10-CM | POA: Diagnosis not present

## 2022-08-06 DIAGNOSIS — Z23 Encounter for immunization: Secondary | ICD-10-CM | POA: Diagnosis not present

## 2022-08-06 DIAGNOSIS — J479 Bronchiectasis, uncomplicated: Secondary | ICD-10-CM | POA: Diagnosis not present

## 2022-08-06 DIAGNOSIS — Z0000 Encounter for general adult medical examination without abnormal findings: Secondary | ICD-10-CM | POA: Diagnosis not present

## 2022-08-13 ENCOUNTER — Ambulatory Visit (INDEPENDENT_AMBULATORY_CARE_PROVIDER_SITE_OTHER): Payer: Medicare PPO

## 2022-08-13 DIAGNOSIS — I495 Sick sinus syndrome: Secondary | ICD-10-CM | POA: Diagnosis not present

## 2022-08-14 LAB — CUP PACEART REMOTE DEVICE CHECK
Battery Impedance: 2126 Ohm
Battery Remaining Longevity: 35 mo
Battery Voltage: 2.75 V
Brady Statistic AP VP Percent: 1 %
Brady Statistic AP VS Percent: 55 %
Brady Statistic AS VP Percent: 0 %
Brady Statistic AS VS Percent: 44 %
Date Time Interrogation Session: 20231020102734
Implantable Lead Implant Date: 20020701
Implantable Lead Implant Date: 20020701
Implantable Lead Location: 753859
Implantable Lead Location: 753860
Implantable Lead Model: 4469
Implantable Lead Model: 4470
Implantable Lead Serial Number: 313853
Implantable Lead Serial Number: 323286
Implantable Pulse Generator Implant Date: 20100908
Lead Channel Impedance Value: 464 Ohm
Lead Channel Impedance Value: 548 Ohm
Lead Channel Pacing Threshold Amplitude: 0.625 V
Lead Channel Pacing Threshold Amplitude: 0.75 V
Lead Channel Pacing Threshold Pulse Width: 0.4 ms
Lead Channel Pacing Threshold Pulse Width: 0.4 ms
Lead Channel Setting Pacing Amplitude: 2 V
Lead Channel Setting Pacing Amplitude: 2.5 V
Lead Channel Setting Pacing Pulse Width: 0.4 ms
Lead Channel Setting Sensing Sensitivity: 2 mV

## 2022-08-25 NOTE — Progress Notes (Signed)
Remote pacemaker transmission.   

## 2022-09-01 ENCOUNTER — Other Ambulatory Visit (HOSPITAL_BASED_OUTPATIENT_CLINIC_OR_DEPARTMENT_OTHER): Payer: Self-pay

## 2022-09-01 MED ORDER — COMIRNATY 30 MCG/0.3ML IM SUSY
PREFILLED_SYRINGE | INTRAMUSCULAR | 0 refills | Status: DC
  Filled 2022-09-01: qty 0.3, 1d supply, fill #0

## 2022-09-07 ENCOUNTER — Other Ambulatory Visit (HOSPITAL_BASED_OUTPATIENT_CLINIC_OR_DEPARTMENT_OTHER): Payer: Self-pay

## 2022-09-07 MED ORDER — RSVPREF3 VAC RECOMB ADJUVANTED 120 MCG/0.5ML IM SUSR
INTRAMUSCULAR | 0 refills | Status: DC
  Filled 2022-09-07: qty 0.5, 1d supply, fill #0

## 2022-09-24 DIAGNOSIS — M170 Bilateral primary osteoarthritis of knee: Secondary | ICD-10-CM | POA: Diagnosis not present

## 2022-09-25 ENCOUNTER — Encounter: Payer: Self-pay | Admitting: Internal Medicine

## 2022-09-25 ENCOUNTER — Ambulatory Visit: Payer: Medicare PPO | Attending: Internal Medicine | Admitting: Internal Medicine

## 2022-09-25 VITALS — BP 124/73 | HR 62 | Resp 17 | Ht 64.0 in | Wt 175.0 lb

## 2022-09-25 DIAGNOSIS — M159 Polyosteoarthritis, unspecified: Secondary | ICD-10-CM | POA: Diagnosis not present

## 2022-09-25 DIAGNOSIS — J849 Interstitial pulmonary disease, unspecified: Secondary | ICD-10-CM | POA: Diagnosis not present

## 2022-09-25 DIAGNOSIS — R768 Other specified abnormal immunological findings in serum: Secondary | ICD-10-CM | POA: Diagnosis not present

## 2022-09-25 NOTE — Progress Notes (Signed)
Office Visit Note  Patient: Judy Chapman             Date of Birth: 1946-04-02           MRN: [id number]             PCP: Crist Infante, MD Referring: Collene Gobble, MD Visit Date: 09/25/2022   Subjective:  New Patient (Initial Visit) (Abnormal labs)   History of Present Illness: Judy Chapman is a 76 y.o. female here for evaluation of positive rheumatoid factor checked in association with newly diagnosed ILD.  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  From pulmonology assessment not sure if this is related to prior COVID infection from last year or unrelated process.  She was having some mostly upper airway irritation and coughing with some dyspnea but has not had progressive worsening of symptoms in the past few months.  Besides lung problems she also developed new rash on her upper back and base of the neck.  She has known osteoarthritis of multiple sites she sees [facility] regularly for intra-articular injections of the knee about every 6 months with a good benefit.  She does have some chronic joint changes in both hands with some pain and brief stiffness in the morning but does not see significant swelling erythema no recent change in her symptoms.   Labs reviewed RF 32 ANA neg CCP neg SSA neg ANCAs neg MM3 neg  06/15/22 HRCT Chest IMPRESSION: 1. The appearance of the lungs is compatible with interstitial lung disease, with a spectrum of findings considered probable usual interstitial pneumonia (UIP) per current ATS guidelines. 2. Aortic atherosclerosis, in addition to left anterior descending coronary artery disease. Please note that although the presence of coronary artery calcium documents the presence of coronary artery disease, the severity of this disease and any potential stenosis cannot be assessed on this non-gated CT examination. Assessment for potential risk factor modification, dietary therapy or pharmacologic therapy may be  warranted, if clinically indicated. 3. Hepatic steatosis   Activities of Daily Living:  Patient reports morning stiffness for 3 minutes.   Patient Denies nocturnal pain.  Difficulty dressing/grooming: Denies Difficulty climbing stairs: Denies Difficulty getting out of chair: Denies Difficulty using hands for taps, buttons, cutlery, and/or writing: Denies  Review of Systems  Constitutional:  Negative for fatigue.  HENT:  Positive for mouth dryness. Negative for mouth sores.   Eyes:  Negative for dryness.  Respiratory:  Negative for shortness of breath.   Cardiovascular:  Negative for chest pain and palpitations.  Gastrointestinal:  Negative for blood in stool, constipation and diarrhea.  Endocrine: Negative for increased urination.  Genitourinary:  Positive for involuntary urination.  Musculoskeletal:  Positive for joint pain, joint pain, joint swelling and morning stiffness. Negative for gait problem, myalgias, muscle weakness, muscle tenderness and myalgias.  Skin:  Negative for color change, rash, hair loss and sensitivity to sunlight.  Allergic/Immunologic: Negative for susceptible to infections.  Neurological:  Negative for dizziness and headaches.  Hematological:  Negative for swollen glands.  Psychiatric/Behavioral:  Negative for depressed mood and sleep disturbance. The patient is not nervous/anxious.     PMFS History:  Patient Active Problem List   Diagnosis Date Noted   Rheumatoid factor positive 09/25/2022   Generalized osteoarthritis of multiple sites 09/25/2022   ILD (interstitial lung disease) ([place]) 06/04/2022   Malignant neoplasm of upper-inner quadrant of right breast in female, estrogen receptor positive ([place]) 03/14/2020   Sinoatrial node dysfunction ([place])  02/08/2013   Allergic rhinitis, cause unspecified 02/11/2012   Lung mass 02/11/2012   Pacemaker-MDT    Cough 01/21/2012   Endometriosis    Osteopenia    Hypertension 03/26/2011    Past Medical History:   Diagnosis Date   ACE inhibitor intolerance    Arthritis    Asthma    outgrew as a child   Cancer ([place])    right breast   Early cataract    Endometriosis    Gout    Heart disease    Hypertension    Interstitial lung disease ([place])    Osteopenia    Pacemaker-MDT    DOI-2002, Generator change 2010   Pneumonia    PVC (premature ventricular contraction)    Sinus node dysfunction (HCC)    Wears glasses     Family History  Problem Relation Age of Onset   Hypertension Mother    Dupuytren's contracture Father    Stomach cancer Paternal Grandfather 77   Arrhythmia Neg Hx    Clotting disorder Neg Hx    Heart attack Neg Hx    Fainting Neg Hx    Anemia Neg Hx    Heart disease Neg Hx    Heart failure Neg Hx    Hyperlipidemia Neg Hx    Colon cancer Neg Hx    Past Surgical History:  Procedure Laterality Date   BREAST LUMPECTOMY     Benign   BREAST LUMPECTOMY WITH RADIOACTIVE SEED LOCALIZATION Right 03/27/2020   Procedure: RIGHT BREAST LUMPECTOMY WITH RADIOACTIVE SEED LOCALIZATION;  Surgeon: Autumn Messing III, MD;  Location: [place];  Service: General;  Laterality: Right;   CORONARY ANGIOPLASTY     DILATION AND CURETTAGE OF UTERUS     GALLBLADDER SURGERY     HYSTEROSCOPY     PACEMAKER INSERTION     Medtronic   TONSILLECTOMY     TUBAL LIGATION     VAGINAL HYSTERECTOMY     WISDOM TOOTH EXTRACTION     Social History   Social History Narrative   Not on file   Immunization History  Administered Date(s) Administered   COVID-19, mRNA, vaccine(Comirnaty)12 years and older 09/01/2022   Respiratory Syncytial Virus Vaccine,Recomb Aduvanted(Arexvy) 09/07/2022   Tdap 04/10/2021   Zoster Recombinat (Shingrix) 08/02/2017     Objective: Vital Signs: BP 124/73 (BP Location: Right Arm, Patient Position: Sitting, Cuff Size: Normal)   Pulse 62   Resp 17   Ht '5\' 4"'$  (1.626 m)   Wt 175 lb (79.4 kg)   BMI 30.04 kg/m    Physical Exam HENT:     Mouth/Throat:     Mouth: Mucous membranes  are moist.     Pharynx: Oropharynx is clear.  Eyes:     Conjunctiva/sclera: Conjunctivae normal.  Cardiovascular:     Rate and Rhythm: Normal rate and regular rhythm.  Pulmonary:     Effort: Pulmonary effort is normal.     Comments: Basilar inspiratory crackles Musculoskeletal:     Right lower leg: No edema.     Left lower leg: No edema.  Lymphadenopathy:     Cervical: No cervical adenopathy.  Skin:    General: Skin is warm and dry.     Findings: No rash.  Neurological:     Mental Status: She is alert.  Psychiatric:        Mood and Affect: Mood normal.      Musculoskeletal Exam:  Shoulders full ROM no tenderness or swelling Elbows full ROM no tenderness or swelling Wrists  full ROM no tenderness or swelling Fingers full ROM, mild tenderness at right 1st [facility] joint with joint squaring and mild MCP hyperextension, 2nd DIP heberdon's node Knees full ROM no tenderness or swelling Ankles full ROM no tenderness or swelling MTPs full ROM no tenderness or swelling, 1st MTP bunion with lateral deviation worse on right foot   Investigation: No additional findings.  Imaging: No results found.  Recent Labs: Lab Results  Component Value Date   WBC 5.6 08/04/2022   HGB 13.4 08/04/2022   PLT 179 08/04/2022   NA 141 08/04/2022   K 4.1 08/04/2022   CL 108 08/04/2022   CO2 30 08/04/2022   GLUCOSE 94 08/04/2022   BUN 23 08/04/2022   CREATININE 0.91 08/04/2022   BILITOT 0.6 08/04/2022   ALKPHOS 39 08/04/2022   AST 21 08/04/2022   ALT 23 08/04/2022   PROT 6.5 08/04/2022   ALBUMIN 3.9 08/04/2022   CALCIUM 8.8 (L) 08/04/2022   GFRAA >60 03/20/2020    Speciality Comments: No specialty comments available.  Procedures:  No procedures performed Allergies: Augmentin [amoxicillin-pot clavulanate], Allopurinol, Anastrozole, Febuxostat, Nitrofurantoin, Pravastatin, Ramipril, Thiazide-type diuretics, and Molnupiravir   Assessment / Plan:     Visit Diagnoses: Rheumatoid factor  positive  At this time I cannot confirm the presence of any inflammatory arthritis no appreciable peripheral synovitis on exam or limited ultrasound inspection.  She has joint changes in her hands and feet and knees consistent with generalized primary osteoarthritis.  Does not have any concerning systemic features such as unintentional weight loss, sicca syndrome, lymphadenopathy, or history of hepatitis to suggest alternate explanation for mildly positive result.  I do not recommend starting additional treatment at this time discussed typical inflammatory joint symptoms and could just follow-up as needed if she develops new problems going forwards.  ILD (interstitial lung disease) ([place])  Established with pulmonology who are monitoring at this time.  I do not currently see evidence of a systemic autoimmune disease process that would be underlying the pulmonary involvement.  Generalized osteoarthritis of multiple sites  Discussed arthritic joint changes she has these in a typical primary arthritis distribution with the first CMC joints knees and first MTPs most obviously affected on exam.  Discussed symptomatic management for osteoarthritis.  She is already established with [facility] who are treating with intra-articular steroid and viscosupplementation injections with regular follow-up.  Orders: No orders of the defined types were placed in this encounter.  No orders of the defined types were placed in this encounter.    Follow-Up Instructions: Return if symptoms worsen or fail to improve.   Collier Salina, MD  Note - This record has been created using [employer].  Chart creation errors have been sought, but may not always  have been located. Such creation errors do not reflect on  the standard of medical care.

## 2022-10-05 ENCOUNTER — Telehealth: Payer: Self-pay | Admitting: Internal Medicine

## 2022-10-05 NOTE — Telephone Encounter (Signed)
Patient left a voicemail stating she had an appointment last week and doesn't see the visit summary on her mychart.  Patient requested a return call.

## 2022-10-05 NOTE — Telephone Encounter (Signed)
Returned patient's call and patient's spouse stated she was able to see summary today.

## 2022-10-22 DIAGNOSIS — R051 Acute cough: Secondary | ICD-10-CM | POA: Diagnosis not present

## 2022-10-22 DIAGNOSIS — R0981 Nasal congestion: Secondary | ICD-10-CM | POA: Diagnosis not present

## 2022-10-22 DIAGNOSIS — S86811A Strain of other muscle(s) and tendon(s) at lower leg level, right leg, initial encounter: Secondary | ICD-10-CM | POA: Diagnosis not present

## 2022-10-22 DIAGNOSIS — J069 Acute upper respiratory infection, unspecified: Secondary | ICD-10-CM | POA: Diagnosis not present

## 2022-10-22 DIAGNOSIS — Z1152 Encounter for screening for COVID-19: Secondary | ICD-10-CM | POA: Diagnosis not present

## 2022-11-12 ENCOUNTER — Ambulatory Visit: Payer: Medicare PPO | Attending: Internal Medicine

## 2022-11-12 DIAGNOSIS — I495 Sick sinus syndrome: Secondary | ICD-10-CM

## 2022-11-13 LAB — CUP PACEART REMOTE DEVICE CHECK
Battery Impedance: 2230 Ohm
Battery Remaining Longevity: 34 mo
Battery Voltage: 2.74 V
Brady Statistic AP VP Percent: 1 %
Brady Statistic AP VS Percent: 54 %
Brady Statistic AS VP Percent: 0 %
Brady Statistic AS VS Percent: 45 %
Date Time Interrogation Session: 20240118131618
Implantable Lead Connection Status: 753985
Implantable Lead Connection Status: 753985
Implantable Lead Implant Date: 20020701
Implantable Lead Implant Date: 20020701
Implantable Lead Location: 753859
Implantable Lead Location: 753860
Implantable Lead Model: 4469
Implantable Lead Model: 4470
Implantable Lead Serial Number: 313853
Implantable Lead Serial Number: 323286
Implantable Pulse Generator Implant Date: 20100908
Lead Channel Impedance Value: 465 Ohm
Lead Channel Impedance Value: 544 Ohm
Lead Channel Pacing Threshold Amplitude: 0.625 V
Lead Channel Pacing Threshold Amplitude: 0.75 V
Lead Channel Pacing Threshold Pulse Width: 0.4 ms
Lead Channel Pacing Threshold Pulse Width: 0.4 ms
Lead Channel Setting Pacing Amplitude: 2 V
Lead Channel Setting Pacing Amplitude: 2.5 V
Lead Channel Setting Pacing Pulse Width: 0.4 ms
Lead Channel Setting Sensing Sensitivity: 2 mV
Zone Setting Status: 755011
Zone Setting Status: 755011

## 2022-12-03 NOTE — Progress Notes (Signed)
Remote pacemaker transmission.   

## 2022-12-17 ENCOUNTER — Encounter: Payer: Self-pay | Admitting: Internal Medicine

## 2022-12-31 DIAGNOSIS — M170 Bilateral primary osteoarthritis of knee: Secondary | ICD-10-CM | POA: Diagnosis not present

## 2023-01-07 DIAGNOSIS — M170 Bilateral primary osteoarthritis of knee: Secondary | ICD-10-CM | POA: Diagnosis not present

## 2023-01-14 DIAGNOSIS — M170 Bilateral primary osteoarthritis of knee: Secondary | ICD-10-CM | POA: Diagnosis not present

## 2023-02-04 DIAGNOSIS — C50411 Malignant neoplasm of upper-outer quadrant of right female breast: Secondary | ICD-10-CM | POA: Diagnosis not present

## 2023-02-04 DIAGNOSIS — Z170 Estrogen receptor positive status [ER+]: Secondary | ICD-10-CM | POA: Diagnosis not present

## 2023-02-11 ENCOUNTER — Ambulatory Visit (INDEPENDENT_AMBULATORY_CARE_PROVIDER_SITE_OTHER): Payer: Medicare PPO

## 2023-02-11 ENCOUNTER — Other Ambulatory Visit: Payer: Self-pay | Admitting: Hematology and Oncology

## 2023-02-11 DIAGNOSIS — I495 Sick sinus syndrome: Secondary | ICD-10-CM | POA: Diagnosis not present

## 2023-02-11 DIAGNOSIS — Z1231 Encounter for screening mammogram for malignant neoplasm of breast: Secondary | ICD-10-CM

## 2023-02-11 LAB — CUP PACEART REMOTE DEVICE CHECK
Battery Impedance: 2596 Ohm
Battery Remaining Longevity: 29 mo
Battery Voltage: 2.73 V
Brady Statistic AP VP Percent: 1 %
Brady Statistic AP VS Percent: 54 %
Brady Statistic AS VP Percent: 0 %
Brady Statistic AS VS Percent: 45 %
Date Time Interrogation Session: 20240418101509
Implantable Lead Connection Status: 753985
Implantable Lead Connection Status: 753985
Implantable Lead Implant Date: 20020701
Implantable Lead Implant Date: 20020701
Implantable Lead Location: 753859
Implantable Lead Location: 753860
Implantable Lead Model: 4469
Implantable Lead Model: 4470
Implantable Lead Serial Number: 313853
Implantable Lead Serial Number: 323286
Implantable Pulse Generator Implant Date: 20100908
Lead Channel Impedance Value: 466 Ohm
Lead Channel Impedance Value: 572 Ohm
Lead Channel Pacing Threshold Amplitude: 0.625 V
Lead Channel Pacing Threshold Amplitude: 0.75 V
Lead Channel Pacing Threshold Pulse Width: 0.4 ms
Lead Channel Pacing Threshold Pulse Width: 0.4 ms
Lead Channel Setting Pacing Amplitude: 2 V
Lead Channel Setting Pacing Amplitude: 2.5 V
Lead Channel Setting Pacing Pulse Width: 0.4 ms
Lead Channel Setting Sensing Sensitivity: 2.8 mV
Zone Setting Status: 755011
Zone Setting Status: 755011

## 2023-03-15 NOTE — Progress Notes (Signed)
Remote pacemaker transmission.   

## 2023-03-30 ENCOUNTER — Ambulatory Visit
Admission: RE | Admit: 2023-03-30 | Discharge: 2023-03-30 | Disposition: A | Discharge status: Home / Self Care | Payer: Medicare PPO | Source: Ambulatory Visit | Attending: Hematology and Oncology | Admitting: Hematology and Oncology

## 2023-03-30 DIAGNOSIS — Z1231 Encounter for screening mammogram for malignant neoplasm of breast: Secondary | ICD-10-CM | POA: Diagnosis not present

## 2023-03-30 HISTORY — DX: Malignant neoplasm of unspecified site of unspecified female breast: C50.919

## 2023-04-07 DIAGNOSIS — M170 Bilateral primary osteoarthritis of knee: Secondary | ICD-10-CM | POA: Diagnosis not present

## 2023-05-13 ENCOUNTER — Ambulatory Visit (INDEPENDENT_AMBULATORY_CARE_PROVIDER_SITE_OTHER): Payer: Medicare PPO

## 2023-05-13 DIAGNOSIS — I495 Sick sinus syndrome: Secondary | ICD-10-CM | POA: Diagnosis not present

## 2023-05-13 LAB — CUP PACEART REMOTE DEVICE CHECK
Battery Impedance: 3242 Ohm
Battery Remaining Longevity: 22 mo
Battery Voltage: 2.71 V
Brady Statistic AP VP Percent: 1 %
Brady Statistic AP VS Percent: 57 %
Brady Statistic AS VP Percent: 0 %
Brady Statistic AS VS Percent: 42 %
Date Time Interrogation Session: 20240718101140
Implantable Lead Connection Status: 753985
Implantable Lead Connection Status: 753985
Implantable Lead Implant Date: 20020701
Implantable Lead Implant Date: 20020701
Implantable Lead Location: 753859
Implantable Lead Location: 753860
Implantable Lead Model: 4469
Implantable Lead Model: 4470
Implantable Lead Serial Number: 313853
Implantable Lead Serial Number: 323286
Implantable Pulse Generator Implant Date: 20100908
Lead Channel Impedance Value: 484 Ohm
Lead Channel Impedance Value: 600 Ohm
Lead Channel Pacing Threshold Amplitude: 0.625 V
Lead Channel Pacing Threshold Amplitude: 0.75 V
Lead Channel Pacing Threshold Pulse Width: 0.4 ms
Lead Channel Pacing Threshold Pulse Width: 0.4 ms
Lead Channel Setting Pacing Amplitude: 2 V
Lead Channel Setting Pacing Amplitude: 2.5 V
Lead Channel Setting Pacing Pulse Width: 0.4 ms
Lead Channel Setting Sensing Sensitivity: 2 mV
Zone Setting Status: 755011
Zone Setting Status: 755011

## 2023-05-27 NOTE — Progress Notes (Signed)
Remote pacemaker transmission.   

## 2023-06-11 ENCOUNTER — Other Ambulatory Visit (HOSPITAL_BASED_OUTPATIENT_CLINIC_OR_DEPARTMENT_OTHER): Payer: Self-pay

## 2023-06-11 ENCOUNTER — Ambulatory Visit (HOSPITAL_BASED_OUTPATIENT_CLINIC_OR_DEPARTMENT_OTHER)
Admission: RE | Admit: 2023-06-11 | Discharge: 2023-06-11 | Disposition: A | Discharge status: Home / Self Care | Payer: Medicare PPO | Source: Ambulatory Visit | Attending: Emergency Medicine | Admitting: Emergency Medicine

## 2023-06-11 DIAGNOSIS — J984 Other disorders of lung: Secondary | ICD-10-CM | POA: Diagnosis not present

## 2023-06-11 DIAGNOSIS — J849 Interstitial pulmonary disease, unspecified: Secondary | ICD-10-CM | POA: Diagnosis not present

## 2023-06-11 DIAGNOSIS — R918 Other nonspecific abnormal finding of lung field: Secondary | ICD-10-CM | POA: Diagnosis not present

## 2023-06-11 MED ORDER — PREVNAR 20 0.5 ML IM SUSY
0.5000 mL | PREFILLED_SYRINGE | Freq: Once | INTRAMUSCULAR | 0 refills | Status: AC
  Filled 2023-06-11: qty 0.5, 1d supply, fill #0

## 2023-06-15 ENCOUNTER — Encounter: Payer: Medicare PPO | Admitting: Internal Medicine

## 2023-06-16 DIAGNOSIS — H2513 Age-related nuclear cataract, bilateral: Secondary | ICD-10-CM | POA: Diagnosis not present

## 2023-06-18 ENCOUNTER — Encounter: Payer: Self-pay | Admitting: Emergency Medicine

## 2023-06-18 ENCOUNTER — Ambulatory Visit: Payer: Medicare PPO | Admitting: Emergency Medicine

## 2023-06-18 VITALS — BP 120/66 | HR 84 | Ht 64.0 in | Wt 177.8 lb

## 2023-06-18 DIAGNOSIS — J849 Interstitial pulmonary disease, unspecified: Secondary | ICD-10-CM

## 2023-06-18 NOTE — Patient Instructions (Signed)
We reviewed your CT scan of the chest today.  This is stable compared with your priors.  Good news. We will hold off on pulmonary function testing for now Agree with restarting your loratadine 10 mg once daily. We will plan to follow-up in 1 year, August 2025.  At that time depending on how you are doing we can decide whether any other testing is necessary.  Please call if you develop any change in your breathing in the interim.

## 2023-06-18 NOTE — Progress Notes (Signed)
Subjective:    Patient ID: Judy Chapman, female    DOB: November 05, 1945, 77 y.o.   MRN: [id number]  HPI  ROV 06/23/22 --follow-up visit for 77 year old woman with history of childhood asthma.  I saw her a month ago in the setting of some progressive shortness of breath.  She had had a reassuring cardiology evaluation, noted her pacemaker was working appropriately.  Chest x-ray showed a possible interstitial prominence.  She described her symptoms as cough, upper airway irritation, globus sensation.  She had been started empirically on Breo, I stopped it to see if there would be an improvement in her throat symptoms.  I also performed a CT scan of the chest to evaluate her interstitial prominence as below. She has not missed the [facility], feels that her throat sx and SOB are both better. She had COVID x 1, over a year ago.   High-resolution CT scan of the chest 06/15/2022 reviewed by me, shows very mild peripheral predominant groundglass attenuation and septal thickening with some subpleural reticulation and mild cylindrical bronchiectatic change.  No frank honeycomb.  Suggestive of early UIP pattern   ROV 06/18/2023 --Judy Chapman is 77 with a history of childhood asthma.  She was having some progressive exertional dyspnea which prompted [place] to ultimately perform a high-resolution CT chest.  There was some very mild peripheral predominant groundglass attenuation and septal thickening without frank honeycomb change, question early UIP pattern.  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  Based on this I asked her to see rheumatology.  We talked about possible PFTs but decided to defer. Currently off Breo.  She does have some intermittent upper airway irritation and cough.  She is about to restart loratadine.  Repeat high-resolution CT chest as below. She has recovered some function, breathing is better.   CT scan of the chest high-resolution 06/11/2023 reviewed by me, shows overall  stability with some very subtle groundglass and septal thickening peripherally, especially at the bases.  May be slightly improved compared with 06/15/2022.   Review of Systems As per HPI  Past Medical History:  Diagnosis Date   ACE inhibitor intolerance    Arthritis    Asthma    outgrew as a child   Breast cancer (HCC)    Cancer (HCC)    right breast   Early cataract    Endometriosis    Gout    Heart disease    Hypertension    Interstitial lung disease (HCC)    Osteopenia    Pacemaker-MDT    DOI-2002, Generator change 2010   Pneumonia    PVC (premature ventricular contraction)    Sinus node dysfunction (HCC)    Wears glasses      Family History  Problem Relation Age of Onset   Hypertension Mother    Dupuytren's contracture Father    Stomach cancer Paternal Grandfather 61   Arrhythmia Neg Hx    Clotting disorder Neg Hx    Heart attack Neg Hx    Fainting Neg Hx    Anemia Neg Hx    Heart disease Neg Hx    Heart failure Neg Hx    Hyperlipidemia Neg Hx    Colon cancer Neg Hx      Social History   Socioeconomic History   Marital status: Married    Spouse name: Not on file   Number of children: Not on file   Years of education: Not on file   Highest education level: Not  on file  Occupational History   Not on file  Tobacco Use   Smoking status: Former    Current packs/day: 0.00    Average packs/day: 0.3 packs/day for 10.0 years (3.0 ttl pk-yrs)    Types: Cigarettes    Start date: 10/27/1995    Quit date: 10/26/2005    Years since quitting: 17.6    Passive exposure: Never   Smokeless tobacco: Never  Vaping Use   Vaping status: Never Used  Substance and Sexual Activity   Alcohol use: Yes    Alcohol/week: 12.0 standard drinks of alcohol    Types: 12 Glasses of wine per week    Comment: 2 glasses of wine daily   Drug use: No   Sexual activity: Yes    Birth control/protection: Surgical    Comment: INTERCOURSE AGE 37 , LESS THAN 5 SEXUAL PARTNERS  Other  Topics Concern   Not on file  Social History Narrative   Not on file   Social Determinants of Health   Financial Resource Strain: Not on file  Food Insecurity: Not on file  Transportation Needs: Not on file  Physical Activity: Not on file  Stress: Not on file  Social Connections: Not on file  Intimate Partner Violence: Not on file     Allergies  Allergen Reactions   Augmentin [Amoxicillin-Pot Clavulanate]     diarrhea   Allopurinol     Other reaction(s): Other (See Comments), rash   Anastrozole     Other reaction(s): couldn't sleep, Other (See Comments)   Febuxostat Other (See Comments)    Other reaction(s): made her itch. urine smelled bad. felt better off of it. but could go back on it if needed she says   Nitrofurantoin Other (See Comments)    Sweating and chills Other reaction(s): achey, clammy. can take it but feels bad on it. Other reaction(s): Other (See Comments) Sweating and chills Sweating and chills    Pravastatin     Other reaction(s): felt miserable on it.felt hot and sweaty in the night.   Ramipril Hives and Swelling      Other reaction(s): dr. [place] Callas wanted it changed due to some allergy issues like swelling., Other (See Comments)   Thiazide-Type Diuretics     Other reaction(s): stop due to gout   Molnupiravir Rash    Other reaction(s): rash     Outpatient Medications Prior to Visit  Medication Sig Dispense Refill   allopurinol (ZYLOPRIM) 100 MG tablet Take 200 mg by mouth daily.     Cholecalciferol (VITAMIN D PO) Take 2,000 Units by mouth daily.      citalopram (CELEXA) 20 MG tablet Take 1 tablet (20 mg total) by mouth daily. 90 tablet 2   fluticasone (CUTIVATE) 0.05 % cream Apply as directed to affected area twice a day in a thin coat x2 weeks     loratadine (CLARITIN) 10 MG tablet 1 tablet Orally Once a day     nebivolol (BYSTOLIC) 10 MG tablet Take 10 mg by mouth daily.     Probiotic Product (PROBIOTIC PO) Probiotic     RSV vaccine recomb  adjuvanted (AREXVY) 120 MCG/0.5ML injection Inject into the muscle. 0.5 mL 0   tamoxifen (NOLVADEX) 10 MG tablet Take 1 tablet (10 mg total) by mouth daily. 90 tablet 3   COVID-19 mRNA vaccine 2023-2024 (COMIRNATY) syringe Inject into the muscle. (Patient not taking: Reported on 09/25/2022) 0.3 mL 0   No facility-administered medications prior to visit.  Objective:   Physical Exam Vitals:   06/18/23 1319  BP: 120/66  Pulse: 84  SpO2: 96%  Weight: 177 lb 12.8 oz (80.6 kg)  Height: 5\' 4"  (1.626 m)   Gen: Pleasant, well-nourished, in no distress,  normal affect  ENT: No lesions,  mouth clear,  oropharynx clear, slightly hoarse voice  Neck: No JVD, no stridor  Lungs: No use of accessory muscles, right basilar inspiratory crackles, otherwise clear  Cardiovascular: RRR, heart sounds normal, no murmur or gallops, no peripheral edema  Musculoskeletal: No deformities, no cyanosis or clubbing  Neuro: alert, awake, non focal  Skin: Warm, she has a rigid circumferential rash with areas of dryness on the posterior neck, no overt scaling      Assessment & Plan:  ILD (interstitial lung disease) (HCC) Her CT scan of the chest is stable.  She had an isolated elevated RF but saw a rheumatology and had a reassuring evaluation.  We have decided to follow-up in 1 year and make a decision about whether she needs PFT or a repeat high-resolution CT chest depending on how she is doing clinically.  Probably would be reasonable to repeat a high-resolution CT chest 2 years from now even if she feels well.  Allergic rhinitis, cause unspecified I recommended loratadine for her allergies, upper airway symptoms.  She is about to start this now.    Levy Pupa, MD, PhD 06/18/2023, 1:47 PM [facility] [phone] or if no answer before 7:00PM call [phone] For any issues after 7:00PM please call eLink [phone]

## 2023-06-18 NOTE — Assessment & Plan Note (Signed)
I recommended loratadine for her allergies, upper airway symptoms.  She is about to start this now.

## 2023-06-18 NOTE — Assessment & Plan Note (Signed)
Her CT scan of the chest is stable.  She had an isolated elevated RF but saw a rheumatology and had a reassuring evaluation.  We have decided to follow-up in 1 year and make a decision about whether she needs PFT or a repeat high-resolution CT chest depending on how she is doing clinically.  Probably would be reasonable to repeat a high-resolution CT chest 2 years from now even if she feels well.

## 2023-06-30 ENCOUNTER — Ambulatory Visit: Payer: Medicare PPO | Attending: Internal Medicine | Admitting: Internal Medicine

## 2023-06-30 ENCOUNTER — Encounter: Payer: Self-pay | Admitting: Internal Medicine

## 2023-06-30 VITALS — BP 128/66 | HR 65 | Wt 178.8 lb

## 2023-06-30 DIAGNOSIS — I495 Sick sinus syndrome: Secondary | ICD-10-CM

## 2023-06-30 DIAGNOSIS — Z950 Presence of cardiac pacemaker: Secondary | ICD-10-CM | POA: Diagnosis not present

## 2023-06-30 NOTE — Patient Instructions (Signed)

## 2023-06-30 NOTE — Progress Notes (Signed)
Patient Care Team: Rodrigo Ran, MD as PCP - General (Internal Medicine) Duke Salvia, MD as PCP - Cardiology (Cardiology) Griselda Miner, MD as Consulting Physician (General Surgery) Lonie Peak, MD as Attending Physician (Radiation Oncology) Duke Salvia, MD as Consulting Physician (Cardiology) Ollen Gross, MD as Consulting Physician (Orthopedic Surgery) Serena Croissant, MD as Consulting Physician (Hematology and Oncology)   HPI  Judy Chapman is a 77 y.o. 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 Deborah Chalk.   The patient denies chest pain, shortness of breath, nocturnal dyspnea, orthopnea or peripheral edema.  There have been no palpitations, lightheadedness or syncope.  Complains of limitations because of her knee.   Intercurrently has been diagnosed with breast cancer 2021 underwent surgery and subsequent hormonal therapy; currently quiescient    DATE TEST EF   2/06 Echo  65% LVH 13/13  7/20 CTA   Ca Score 0  6/23 Echo  55-60%         Date Cr K Hgb  6/20 0.7   13.5   12/22 0.85 3.8 13.1  10/23 0.91 4.1 13.4      Past Medical History:  Diagnosis Date   ACE inhibitor intolerance    Arthritis    Asthma    outgrew as a child   Breast cancer (HCC)    Cancer (HCC)    right breast   Early cataract    Endometriosis    Gout    Heart disease    Hypertension    Interstitial lung disease (HCC)    Osteopenia    Pacemaker-MDT    DOI-2002, Generator change 2010   Pneumonia    PVC (premature ventricular contraction)    Sinus node dysfunction (HCC)    Wears glasses     Past Surgical History:  Procedure Laterality Date   BREAST LUMPECTOMY     Benign   BREAST LUMPECTOMY WITH RADIOACTIVE SEED LOCALIZATION Right 03/27/2020   Procedure: RIGHT BREAST LUMPECTOMY WITH RADIOACTIVE SEED LOCALIZATION;  Surgeon: Griselda Miner, MD;   Location: [facility];  Service: General;  Laterality: Right;   CORONARY ANGIOPLASTY     DILATION AND CURETTAGE OF UTERUS     GALLBLADDER SURGERY     HYSTEROSCOPY     PACEMAKER INSERTION     Medtronic   TONSILLECTOMY     TUBAL LIGATION     VAGINAL HYSTERECTOMY     WISDOM TOOTH EXTRACTION      Current Outpatient Medications  Medication Sig Dispense Refill   allopurinol (ZYLOPRIM) 100 MG tablet Take 200 mg by mouth daily.     Cholecalciferol (VITAMIN D PO) Take 2,000 Units by mouth daily.      citalopram (CELEXA) 20 MG tablet Take 1 tablet (20 mg total) by mouth daily. 90 tablet 2   fluticasone (CUTIVATE) 0.05 % cream Apply as directed to affected area twice a day in a thin coat x2 weeks     loratadine (CLARITIN) 10 MG tablet 1 tablet Orally Once a day     nebivolol (BYSTOLIC) 10 MG tablet Take 10 mg by mouth daily.     Probiotic Product (PROBIOTIC PO) Probiotic     RSV vaccine recomb adjuvanted (AREXVY) 120 MCG/0.5ML injection Inject into the muscle. 0.5 mL 0   tamoxifen (NOLVADEX) 10 MG tablet Take 1 tablet (10 mg total) by mouth daily. 90 tablet 3   COVID-19 mRNA  vaccine 2023-2024 (COMIRNATY) syringe Inject into the muscle. (Patient not taking: Reported on 09/25/2022) 0.3 mL 0   No current facility-administered medications for this visit.    Allergies  Allergen Reactions   Augmentin [Amoxicillin-Pot Clavulanate]     diarrhea   Allopurinol     Other reaction(s): Other (See Comments), rash   Anastrozole     Other reaction(s): couldn't sleep, Other (See Comments)   Febuxostat Other (See Comments)    Other reaction(s): made her itch. urine smelled bad. felt better off of it. but could go back on it if needed she says   Nitrofurantoin Other (See Comments)    Sweating and chills Other reaction(s): achey, clammy. can take it but feels bad on it. Other reaction(s): Other (See Comments) Sweating and chills Sweating and chills    Pravastatin     Other reaction(s): felt miserable on  it.felt hot and sweaty in the night.   Ramipril Hives and Swelling      Other reaction(s): dr. [place] Callas wanted it changed due to some allergy issues like swelling., Other (See Comments)   Thiazide-Type Diuretics     Other reaction(s): stop due to gout   Molnupiravir Rash    Other reaction(s): rash    Review of Systems negative except from HPI and PMH  Physical Exam BP 128/66   Pulse 65   Wt 178 lb 12.8 oz (81.1 kg)   SpO2 95%   BMI 30.69 kg/m  Well developed and well nourished in no acute distress HENT normal Neck supple with JVP-flat Basilar crackles Device pocket well healed; without hematoma or erythema.  There is no tethering  Regular rate and rhythm, no  gallop No murmur Abd-soft with active BS No Clubbing cyanosis  edema Skin-warm and dry A & Oriented  Grossly normal sensory and motor function  ECG a pacing 65 20/09/41  Device function is normal. Programming changes none  See Paceart for details     Assessment and  Plan Hypertension      Sinus node dysfunction  Dyspnea on exertion  Pacemaker-Medtronic     Subclinical atrial fibrillation  Low voltage      Blood pressure is well-controlled, continue Bystolic  Device function is normal heart rate excursion is adequate.  Followed now by pulmonary; noncardiac dyspnea euvolemic.

## 2023-07-13 DIAGNOSIS — H2513 Age-related nuclear cataract, bilateral: Secondary | ICD-10-CM | POA: Diagnosis not present

## 2023-07-19 DIAGNOSIS — M170 Bilateral primary osteoarthritis of knee: Secondary | ICD-10-CM | POA: Diagnosis not present

## 2023-07-19 DIAGNOSIS — M1712 Unilateral primary osteoarthritis, left knee: Secondary | ICD-10-CM | POA: Diagnosis not present

## 2023-07-19 DIAGNOSIS — M1711 Unilateral primary osteoarthritis, right knee: Secondary | ICD-10-CM | POA: Diagnosis not present

## 2023-07-29 DIAGNOSIS — Z87891 Personal history of nicotine dependence: Secondary | ICD-10-CM | POA: Diagnosis not present

## 2023-07-29 DIAGNOSIS — H25811 Combined forms of age-related cataract, right eye: Secondary | ICD-10-CM | POA: Diagnosis not present

## 2023-07-29 DIAGNOSIS — I10 Essential (primary) hypertension: Secondary | ICD-10-CM | POA: Diagnosis not present

## 2023-07-29 DIAGNOSIS — Z79899 Other long term (current) drug therapy: Secondary | ICD-10-CM | POA: Diagnosis not present

## 2023-07-29 DIAGNOSIS — Z950 Presence of cardiac pacemaker: Secondary | ICD-10-CM | POA: Diagnosis not present

## 2023-07-29 DIAGNOSIS — Z170 Estrogen receptor positive status [ER+]: Secondary | ICD-10-CM | POA: Diagnosis not present

## 2023-07-29 DIAGNOSIS — Z853 Personal history of malignant neoplasm of breast: Secondary | ICD-10-CM | POA: Diagnosis not present

## 2023-07-30 DIAGNOSIS — H2512 Age-related nuclear cataract, left eye: Secondary | ICD-10-CM | POA: Diagnosis not present

## 2023-07-30 DIAGNOSIS — Z961 Presence of intraocular lens: Secondary | ICD-10-CM | POA: Diagnosis not present

## 2023-07-30 DIAGNOSIS — Z9841 Cataract extraction status, right eye: Secondary | ICD-10-CM | POA: Diagnosis not present

## 2023-08-04 ENCOUNTER — Inpatient Hospital Stay: Payer: Medicare PPO | Admitting: Hematology and Oncology

## 2023-08-12 ENCOUNTER — Ambulatory Visit (INDEPENDENT_AMBULATORY_CARE_PROVIDER_SITE_OTHER): Payer: Medicare PPO

## 2023-08-12 DIAGNOSIS — I495 Sick sinus syndrome: Secondary | ICD-10-CM

## 2023-08-13 ENCOUNTER — Telehealth: Payer: Self-pay

## 2023-08-13 LAB — CUP PACEART REMOTE DEVICE CHECK
Battery Impedance: 4826 Ohm
Battery Remaining Longevity: 9 mo
Battery Voltage: 2.66 V
Brady Statistic AP VP Percent: 1 %
Brady Statistic AP VS Percent: 72 %
Brady Statistic AS VP Percent: 0 %
Brady Statistic AS VS Percent: 27 %
Date Time Interrogation Session: 20241017194808
Implantable Lead Connection Status: 753985
Implantable Lead Connection Status: 753985
Implantable Lead Implant Date: 20020701
Implantable Lead Implant Date: 20020701
Implantable Lead Location: 753859
Implantable Lead Location: 753860
Implantable Lead Model: 4469
Implantable Lead Model: 4470
Implantable Lead Serial Number: 313853
Implantable Lead Serial Number: 323286
Implantable Pulse Generator Implant Date: 20100908
Lead Channel Impedance Value: 497 Ohm
Lead Channel Impedance Value: 551 Ohm
Lead Channel Pacing Threshold Amplitude: 0.625 V
Lead Channel Pacing Threshold Amplitude: 0.75 V
Lead Channel Pacing Threshold Pulse Width: 0.4 ms
Lead Channel Pacing Threshold Pulse Width: 0.4 ms
Lead Channel Setting Pacing Amplitude: 2 V
Lead Channel Setting Pacing Amplitude: 2.5 V
Lead Channel Setting Pacing Pulse Width: 0.4 ms
Lead Channel Setting Sensing Sensitivity: 2 mV
Zone Setting Status: 755011
Zone Setting Status: 755011

## 2023-08-13 NOTE — Telephone Encounter (Signed)
..  Pre-operative Risk Assessment    Patient Name: Judy Chapman  DOB: 1945-12-28 MRN: [id number]      Request for Surgical Clearance    Procedure:   LT TOTAL KNEE ARTHROPLASTY   Date of Surgery:  Clearance 11/08/23                                 Surgeon:  DR Ollen Gross Surgeon's Group or Practice Name:  [facility] Phone number:  [phone] Fax number:  [phone]   Type of Clearance Requested:   - Medical    Type of Anesthesia:   CHOICE   Additional requests/questions:   LAST O/V 06/30/23, NO NEW APPT  Signed, Renee Ramus   08/13/2023, 11:36 AM

## 2023-08-13 NOTE — Telephone Encounter (Signed)
Name: Judy Chapman  DOB: 12-Feb-1946  MRN: [id number]  Primary Cardiologist: Sherryl Manges, MD  Chart reviewed as part of pre-operative protocol coverage. Because of Judy Chapman's past medical history and time since last visit, she will require a follow-up telephone visit in order to better assess preoperative cardiovascular risk.  Pre-op covering staff: - Please schedule appointment and call patient to inform them. If patient already had an upcoming appointment within acceptable timeframe, please add "pre-op clearance" to the appointment notes so provider is aware. - Please contact requesting surgeon's office via preferred method (i.e, phone, fax) to inform them of need for appointment prior to surgery.  No medications indicated as needing held.  Sharlene Dory, PA-C  08/13/2023, 11:45 AM

## 2023-08-13 NOTE — Telephone Encounter (Signed)
I left a message for the patient to call our office to schedule a tele visit for pre-op clearance.  

## 2023-08-17 ENCOUNTER — Telehealth: Payer: Self-pay | Admitting: *Deleted

## 2023-08-17 NOTE — Telephone Encounter (Signed)
Pt has been scheduled for tele pre op appt 10/25/23. Med rec and consent are done.

## 2023-08-17 NOTE — Telephone Encounter (Signed)
Pt has been scheduled for tele pre op appt 10/25/23. Med rec and consent are done.     Patient Consent for Virtual Visit        Judy Chapman has provided verbal consent on 08/17/2023 for a virtual visit (video or telephone).   CONSENT FOR VIRTUAL VISIT FOR:  Judy Chapman  By participating in this virtual visit I agree to the following:  I hereby voluntarily request, consent and authorize [facility] and its employed or contracted physicians, physician assistants, nurse practitioners or other licensed health care professionals (the Practitioner), to provide me with telemedicine health care services (the "Services") as deemed necessary by the treating Practitioner. I acknowledge and consent to receive the Services by the Practitioner via telemedicine. I understand that the telemedicine visit will involve communicating with the Practitioner through live audiovisual communication technology and the disclosure of certain medical information by electronic transmission. I acknowledge that I have been given the opportunity to request an in-person assessment or other available alternative prior to the telemedicine visit and am voluntarily participating in the telemedicine visit.  I understand that I have the right to withhold or withdraw my consent to the use of telemedicine in the course of my care at any time, without affecting my right to future care or treatment, and that the Practitioner or I may terminate the telemedicine visit at any time. I understand that I have the right to inspect all information obtained and/or recorded in the course of the telemedicine visit and may receive copies of available information for a reasonable fee.  I understand that some of the potential risks of receiving the Services via telemedicine include:  Delay or interruption in medical evaluation due to technological equipment failure or disruption; Information transmitted may not be sufficient  (e.g. poor resolution of images) to allow for appropriate medical decision making by the Practitioner; and/or  In rare instances, security protocols could fail, causing a breach of personal health information.  Furthermore, I acknowledge that it is my responsibility to provide information about my medical history, conditions and care that is complete and accurate to the best of my ability. I acknowledge that Practitioner's advice, recommendations, and/or decision may be based on factors not within their control, such as incomplete or inaccurate data provided by me or distortions of diagnostic images or specimens that may result from electronic transmissions. I understand that the practice of medicine is not an exact science and that Practitioner makes no warranties or guarantees regarding treatment outcomes. I acknowledge that a copy of this consent can be made available to me via my patient portal [facility] MyChart), or I can request a printed copy by calling the office of [facility].    I understand that my insurance will be billed for this visit.   I have read or had this consent read to me. I understand the contents of this consent, which adequately explains the benefits and risks of the Services being provided via telemedicine.  I have been provided ample opportunity to ask questions regarding this consent and the Services and have had my questions answered to my satisfaction. I give my informed consent for the services to be provided through the use of telemedicine in my medical care

## 2023-08-19 DIAGNOSIS — H2512 Age-related nuclear cataract, left eye: Secondary | ICD-10-CM | POA: Diagnosis not present

## 2023-08-19 DIAGNOSIS — H25812 Combined forms of age-related cataract, left eye: Secondary | ICD-10-CM | POA: Diagnosis not present

## 2023-08-31 ENCOUNTER — Encounter: Payer: Self-pay | Admitting: Internal Medicine

## 2023-08-31 NOTE — Progress Notes (Signed)
Remote pacemaker transmission.   

## 2023-09-07 ENCOUNTER — Inpatient Hospital Stay: Payer: Medicare PPO | Attending: Hematology and Oncology | Admitting: Hematology and Oncology

## 2023-09-07 VITALS — BP 128/73 | HR 95 | Temp 97.5°F | Resp 18 | Ht 64.0 in | Wt 177.9 lb

## 2023-09-07 DIAGNOSIS — J849 Interstitial pulmonary disease, unspecified: Secondary | ICD-10-CM | POA: Insufficient documentation

## 2023-09-07 DIAGNOSIS — Z79810 Long term (current) use of selective estrogen receptor modulators (SERMs): Secondary | ICD-10-CM | POA: Diagnosis not present

## 2023-09-07 DIAGNOSIS — M109 Gout, unspecified: Secondary | ICD-10-CM | POA: Diagnosis not present

## 2023-09-07 DIAGNOSIS — M1990 Unspecified osteoarthritis, unspecified site: Secondary | ICD-10-CM | POA: Insufficient documentation

## 2023-09-07 DIAGNOSIS — Z880 Allergy status to penicillin: Secondary | ICD-10-CM | POA: Insufficient documentation

## 2023-09-07 DIAGNOSIS — C50211 Malignant neoplasm of upper-inner quadrant of right female breast: Secondary | ICD-10-CM | POA: Diagnosis not present

## 2023-09-07 DIAGNOSIS — K219 Gastro-esophageal reflux disease without esophagitis: Secondary | ICD-10-CM | POA: Insufficient documentation

## 2023-09-07 DIAGNOSIS — Z170 Estrogen receptor positive status [ER+]: Secondary | ICD-10-CM | POA: Insufficient documentation

## 2023-09-07 DIAGNOSIS — Z1721 Progesterone receptor positive status: Secondary | ICD-10-CM | POA: Insufficient documentation

## 2023-09-07 DIAGNOSIS — Z881 Allergy status to other antibiotic agents status: Secondary | ICD-10-CM | POA: Insufficient documentation

## 2023-09-07 DIAGNOSIS — Z79899 Other long term (current) drug therapy: Secondary | ICD-10-CM | POA: Diagnosis not present

## 2023-09-07 DIAGNOSIS — R232 Flushing: Secondary | ICD-10-CM | POA: Insufficient documentation

## 2023-09-07 MED ORDER — TAMOXIFEN CITRATE 10 MG PO TABS: 10.0000 mg | ORAL_TABLET | Freq: Every day | ORAL | 3 refills | Status: DC

## 2023-09-07 NOTE — Assessment & Plan Note (Signed)
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  Currently on 10 mg daily. Tamoxifen toxicities:   Breast cancer surveillance: 1.  Breast exam 09/07/2023: Benign 2. mammogram 03/31/2023: Benign breast density category C 3.  06/21/2023: High-resolution CT chest without contrast: Findings compatible with interstitial lung disease and hepatic steatosis, unchanged   Her husband had prostate cancer and is doing well. Return to clinic in 1 year for follow-up

## 2023-09-07 NOTE — Progress Notes (Signed)
Patient Care Team: Rodrigo Ran, MD as PCP - General (Internal Medicine) Duke Salvia, MD as PCP - Cardiology (Cardiology) Griselda Miner, MD as Consulting Physician (General Surgery) Lonie Peak, MD as Attending Physician (Radiation Oncology) Duke Salvia, MD as Consulting Physician (Cardiology) Ollen Gross, MD as Consulting Physician (Orthopedic Surgery) Serena Croissant, MD as Consulting Physician (Hematology and Oncology)  DIAGNOSIS:  Encounter Diagnosis  Name Primary?   Malignant neoplasm of upper-inner quadrant of right breast in female, estrogen receptor positive (HCC) Yes    SUMMARY OF ONCOLOGIC HISTORY: Oncology History  Malignant neoplasm of upper-inner quadrant of right breast in female, estrogen receptor positive (HCC)  03/14/2020 Initial Diagnosis   Malignant neoplasm of upper-inner quadrant of right breast in female, estrogen receptor positive (HCC)   03/27/2020 Cancer Staging   Staging form: Breast, AJCC 8th Edition - Pathologic stage from 03/27/2020: Stage IA (pT1b, pN0, cM0, G2, ER+, PR+, HER2-) - Signed by Loa Socks, NP on 04/10/2020     CHIEF COMPLIANT: Follow-up on tamoxifen  HISTORY OF PRESENT ILLNESS:   History of Present Illness   The patient, with a history of breast cancer and arthritis, presents for a routine follow-up. She reports severe arthritis, particularly in the knee, which is scheduled for replacement in January. She has recently undergone two successful cataract surgeries. She is currently on tamoxifen for breast cancer and Celebrex for arthritis. She reports that the Celebrex is causing hot flashes, which she did not experience on a lower dose of tamoxifen. She also takes Prilosec for reflux. She has interstitial lung disease, which is stable and thought to be due to previous pneumonia. She also mentions a history of gout, for which she takes allopurinol.         ALLERGIES:  is allergic to augmentin [amoxicillin-pot  clavulanate], allopurinol, anastrozole, febuxostat, nitrofurantoin, pravastatin, ramipril, thiazide-type diuretics, and molnupiravir.  MEDICATIONS:  Current Outpatient Medications  Medication Sig Dispense Refill   allopurinol (ZYLOPRIM) 100 MG tablet Take 200 mg by mouth daily.     celecoxib (CELEBREX) 200 MG capsule Take 200 mg by mouth daily.     Cholecalciferol (VITAMIN D PO) Take 2,000 Units by mouth daily.      citalopram (CELEXA) 20 MG tablet Take 1 tablet (20 mg total) by mouth daily. 90 tablet 2   fluticasone (CUTIVATE) 0.05 % cream Apply as directed to affected area twice a day in a thin coat x2 weeks     loratadine (CLARITIN) 10 MG tablet 1 tablet Orally Once a day     nebivolol (BYSTOLIC) 10 MG tablet Take 10 mg by mouth daily.     omeprazole (PRILOSEC) 20 MG capsule Take 20 mg by mouth daily.     Probiotic Product (PROBIOTIC PO) Probiotic     RSV vaccine recomb adjuvanted (AREXVY) 120 MCG/0.5ML injection Inject into the muscle. 0.5 mL 0   tamoxifen (NOLVADEX) 10 MG tablet Take 1 tablet (10 mg total) by mouth daily. 90 tablet 3   No current facility-administered medications for this visit.    PHYSICAL EXAMINATION: ECOG PERFORMANCE STATUS: 1 - Symptomatic but completely ambulatory  Vitals:   09/07/23 1421  BP: 128/73  Pulse: 95  Resp: 18  Temp: (!) 97.5 F (36.4 C)  SpO2: 93%   Filed Weights   09/07/23 1421  Weight: 177 lb 14.4 oz (80.7 kg)    Physical Exam   BREAST: Breasts examined bilaterally with no abnormalities detected.      (exam performed in the presence  of a chaperone)  LABORATORY DATA:  I have reviewed the data as listed    Latest Ref Rng & Units 08/04/2022   11:28 AM 10/15/2021   11:30 AM 08/07/2020    2:48 PM  CMP  Glucose 70 - 99 mg/dL 94  409  89   BUN 8 - 23 mg/dL 23  19  21    Creatinine 0.44 - 1.00 mg/dL 8.11  9.14  7.82   Sodium 135 - 145 mmol/L 141  142  141   Potassium 3.5 - 5.1 mmol/L 4.1  3.8  4.0   Chloride 98 - 111 mmol/L 108   108  108   CO2 22 - 32 mmol/L 30  26  27    Calcium 8.9 - 10.3 mg/dL 8.8  8.8  8.7   Total Protein 6.5 - 8.1 g/dL 6.5  6.7  6.7   Total Bilirubin 0.3 - 1.2 mg/dL 0.6  0.7  0.6   Alkaline Phos 38 - 126 U/L 39  47  49   AST 15 - 41 U/L 21  22  24    ALT 0 - 44 U/L 23  19  22      Lab Results  Component Value Date   WBC 5.6 08/04/2022   HGB 13.4 08/04/2022   HCT 39.5 08/04/2022   MCV 98.5 08/04/2022   PLT 179 08/04/2022   NEUTROABS 3.6 08/04/2022    ASSESSMENT & PLAN:  Malignant neoplasm of upper-inner quadrant of right breast in female, estrogen receptor positive (HCC) 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  Currently on 10 mg daily. Tamoxifen toxicities: Mild intermittent hot flashes Initial plan is to treat her for 5 years.   Breast cancer surveillance: 1.  Breast exam 09/07/2023: Benign 2. mammogram 03/31/2023: Benign breast density category C 3.  06/21/2023: High-resolution CT chest without contrast: Findings compatible with interstitial lung disease and hepatic steatosis, unchanged   Her husband had prostate cancer and is doing well. Return to clinic in 1 year for follow-up    Arthritis Severe, particularly in the knee. Scheduled for knee replacement in January. Currently taking Celebrex for arthritis, which is causing hot flashes. -Continue Celebrex until knee replacement. -Consider potential for reduced need post knee replacement.  Gastroesophageal Reflux Disease (GERD) On Prilosec. -Continue Prilosec.  Gout On Allopurinol prophylaxis after a previous episode. -Continue Allopurinol.  Interstitial Lung Disease Stable, with recent CT showing no progression. Likely secondary to previous pneumonia. -Continue current management as directed by  pulmonologist.  General Health Maintenance -Continue regular mammograms. -Plan for follow-up in 1 year.          No orders of the defined types were placed in this encounter.  The patient has a good understanding of the overall plan. she agrees with it. she will call with any problems that may develop before the next visit here. Total time spent: 30 mins including face to face time and time spent for planning, charting and co-ordination of care   Tamsen Meek, MD 09/07/23

## 2023-09-10 DIAGNOSIS — M1712 Unilateral primary osteoarthritis, left knee: Secondary | ICD-10-CM | POA: Diagnosis not present

## 2023-09-16 DIAGNOSIS — Z1212 Encounter for screening for malignant neoplasm of rectum: Secondary | ICD-10-CM | POA: Diagnosis not present

## 2023-09-16 DIAGNOSIS — I10 Essential (primary) hypertension: Secondary | ICD-10-CM | POA: Diagnosis not present

## 2023-09-16 DIAGNOSIS — E785 Hyperlipidemia, unspecified: Secondary | ICD-10-CM | POA: Diagnosis not present

## 2023-09-16 DIAGNOSIS — Z79899 Other long term (current) drug therapy: Secondary | ICD-10-CM | POA: Diagnosis not present

## 2023-09-16 DIAGNOSIS — M8580 Other specified disorders of bone density and structure, unspecified site: Secondary | ICD-10-CM | POA: Diagnosis not present

## 2023-09-16 DIAGNOSIS — M109 Gout, unspecified: Secondary | ICD-10-CM | POA: Diagnosis not present

## 2023-10-01 DIAGNOSIS — R82998 Other abnormal findings in urine: Secondary | ICD-10-CM | POA: Diagnosis not present

## 2023-10-04 DIAGNOSIS — M25662 Stiffness of left knee, not elsewhere classified: Secondary | ICD-10-CM | POA: Diagnosis not present

## 2023-10-04 DIAGNOSIS — M1712 Unilateral primary osteoarthritis, left knee: Secondary | ICD-10-CM | POA: Diagnosis not present

## 2023-10-04 DIAGNOSIS — M25562 Pain in left knee: Secondary | ICD-10-CM | POA: Diagnosis not present

## 2023-10-08 DIAGNOSIS — Z950 Presence of cardiac pacemaker: Secondary | ICD-10-CM | POA: Diagnosis not present

## 2023-10-08 DIAGNOSIS — J479 Bronchiectasis, uncomplicated: Secondary | ICD-10-CM | POA: Diagnosis not present

## 2023-10-08 DIAGNOSIS — Z0000 Encounter for general adult medical examination without abnormal findings: Secondary | ICD-10-CM | POA: Diagnosis not present

## 2023-10-08 DIAGNOSIS — E785 Hyperlipidemia, unspecified: Secondary | ICD-10-CM | POA: Diagnosis not present

## 2023-10-08 DIAGNOSIS — I10 Essential (primary) hypertension: Secondary | ICD-10-CM | POA: Diagnosis not present

## 2023-10-08 DIAGNOSIS — D7589 Other specified diseases of blood and blood-forming organs: Secondary | ICD-10-CM | POA: Diagnosis not present

## 2023-10-08 DIAGNOSIS — C50919 Malignant neoplasm of unspecified site of unspecified female breast: Secondary | ICD-10-CM | POA: Diagnosis not present

## 2023-10-08 DIAGNOSIS — M1990 Unspecified osteoarthritis, unspecified site: Secondary | ICD-10-CM | POA: Diagnosis not present

## 2023-10-08 DIAGNOSIS — N390 Urinary tract infection, site not specified: Secondary | ICD-10-CM | POA: Diagnosis not present

## 2023-10-25 ENCOUNTER — Ambulatory Visit: Payer: Medicare PPO | Attending: Physician Assistant

## 2023-10-25 DIAGNOSIS — Z01810 Encounter for preprocedural cardiovascular examination: Secondary | ICD-10-CM | POA: Diagnosis not present

## 2023-10-25 NOTE — Progress Notes (Signed)
Virtual Visit via Telephone Note   Because of Judy Chapman's co-morbid illnesses, she is at least at moderate risk for complications without adequate follow up.  This format is felt to be most appropriate for this patient at this time.  The patient did not have access to video technology/had technical difficulties with video requiring transitioning to audio format only (telephone).  All issues noted in this document were discussed and addressed.  No physical exam could be performed with this format.  Please refer to the patient's chart for her consent to telehealth for [facility].  Evaluation Performed:  Preoperative cardiovascular risk assessment _____________   Date:  10/25/2023   Patient ID:  Judy, Chapman [birth year], MRN [id number] Patient Location:  Home Provider location:   Office  Primary Care Provider:  Rodrigo Ran, MD Primary Cardiologist:  Sherryl Manges, MD  Chief Complaint / Patient Profile   77 y.o. y/o female with a h/o PPM originally implanted in 2002 with device generator replacement March 2010, and subclinical atrial fibrillation who is pending left total knee arthoplasty and presents today for telephonic preoperative cardiovascular risk assessment.  History of Present Illness    Judy Chapman is a 77 y.o. female who presents via audio/video conferencing for a telehealth visit today.  Pt was last seen in cardiology clinic on 06/30/23 by Dr. Graciela Husbands.  At that time Judy Chapman was doing well.  The patient is now pending procedure as outlined above. Since her last visit, she has not had any new cardiovascular symptoms.  She does meet minimal METS on the DASI.  She lives bridge in place twice a week.  Not on any blood thinners.  We did discuss stopping her meloxicam a week before surgery but she has yet to discuss this with the surgical team.  Past Medical History    Past Medical History:  Diagnosis Date   ACE inhibitor  intolerance    Arthritis    Asthma    outgrew as a child   Breast cancer (HCC)    Cancer (HCC)    right breast   Early cataract    Endometriosis    Gout    Heart disease    Hypertension    Interstitial lung disease (HCC)    Osteopenia    Pacemaker-MDT    DOI-2002, Generator change 2010   Pneumonia    PVC (premature ventricular contraction)    Sinus node dysfunction (HCC)    Wears glasses    Past Surgical History:  Procedure Laterality Date   BREAST LUMPECTOMY     Benign   BREAST LUMPECTOMY WITH RADIOACTIVE SEED LOCALIZATION Right 03/27/2020   Procedure: RIGHT BREAST LUMPECTOMY WITH RADIOACTIVE SEED LOCALIZATION;  Surgeon: Griselda Miner, MD;  Location: [facility] OR;  Service: General;  Laterality: Right;   CORONARY ANGIOPLASTY     DILATION AND CURETTAGE OF UTERUS     GALLBLADDER SURGERY     HYSTEROSCOPY     PACEMAKER INSERTION     [employer]tronic   TONSILLECTOMY     TUBAL LIGATION     VAGINAL HYSTERECTOMY     WISDOM TOOTH EXTRACTION      Allergies  Allergies  Allergen Reactions   Augmentin [Amoxicillin-Pot Clavulanate]     diarrhea   Allopurinol     Other reaction(s): Other (See Comments), rash   Anastrozole     Other reaction(s): couldn't sleep, Other (See Comments)   Febuxostat Other (See Comments)    Other reaction(s): made her  itch. urine smelled bad. felt better off of it. but could go back on it if needed she says   Nitrofurantoin Other (See Comments)    Sweating and chills Other reaction(s): achey, clammy. can take it but feels bad on it. Other reaction(s): Other (See Comments) Sweating and chills Sweating and chills    Pravastatin     Other reaction(s): felt miserable on it.felt hot and sweaty in the night.   Ramipril Hives and Swelling      Other reaction(s): dr.  Callas wanted it changed due to some allergy issues like swelling., Other (See Comments)   Thiazide-Type Diuretics     Other reaction(s): stop due to gout   Molnupiravir Rash    Other reaction(s):  rash    Home Medications    Prior to Admission medications   Medication Sig Start Date End Date Taking? Authorizing Provider  allopurinol (ZYLOPRIM) 100 MG tablet Take 200 mg by mouth daily. 10/11/19   [provider]  celecoxib (CELEBREX) 200 MG capsule Take 200 mg by mouth daily.    [provider]  Cholecalciferol (VITAMIN D PO) Take 2,000 Units by mouth daily.     [provider]  citalopram (CELEXA) 20 MG tablet Take 1 tablet (20 mg total) by mouth daily. 03/26/11   Roger Shelter, MD  fluticasone (CUTIVATE) 0.05 % cream Apply as directed to affected area twice a day in a thin coat x2 weeks    [provider]  loratadine (CLARITIN) 10 MG tablet 1 tablet Orally Once a day    [provider]  nebivolol (BYSTOLIC) 10 MG tablet Take 10 mg by mouth daily.    [provider]  omeprazole (PRILOSEC) 20 MG capsule Take 20 mg by mouth daily.    [provider]  Probiotic Product (PROBIOTIC PO) Probiotic    [provider]  RSV vaccine recomb adjuvanted (AREXVY) 120 MCG/0.5ML injection Inject into the muscle. 09/07/22   Judyann Munson, MD  tamoxifen (NOLVADEX) 10 MG tablet Take 1 tablet (10 mg total) by mouth daily. 09/07/23   Serena Croissant, MD    Physical Exam    Vital Signs:  Timothy Lasso does not have vital signs available for review today.  Given telephonic nature of communication, physical exam is limited. AAOx3. NAD. Normal affect.  Speech and respirations are unlabored.  Accessory Clinical Findings    None  Assessment & Plan    1.  Preoperative Cardiovascular Risk Assessment:  Judy Chapman perioperative risk of a major cardiac event is 0.4% according to the Revised Cardiac Risk Index (RCRI).  Therefore, she is at low risk for perioperative complications.   Her functional capacity is good at 5.07 METs according to the [facility] Activity Status Index (DASI). Recommendations: According to ACC/AHA  guidelines, no further cardiovascular testing needed.  The patient may proceed to surgery at acceptable risk.    Time:   Today, I have spent 10 minutes with the patient with telehealth technology discussing medical history, symptoms, and management plan.     Sharlene Dory, PA-C  10/25/2023, 10:22 AM

## 2023-10-26 DIAGNOSIS — Z1211 Encounter for screening for malignant neoplasm of colon: Secondary | ICD-10-CM | POA: Diagnosis not present

## 2023-11-01 LAB — COLOGUARD: COLOGUARD: POSITIVE — AB

## 2023-11-02 ENCOUNTER — Encounter: Payer: Self-pay | Admitting: Internal Medicine

## 2023-11-02 NOTE — H&P (Signed)
 TOTAL KNEE ADMISSION H&P  Patient is being admitted for left total knee arthroplasty.  Subjective:  Chief Complaint: Left knee pain.  HPI: Judy Chapman, 78 y.o. female has a history of pain and functional disability in the left knee due to arthritis and has failed non-surgical conservative treatments for greater than 12 weeks to include NSAID's and/or analgesics, corticosteriod injections, viscosupplementation injections, and activity modification. Onset of symptoms was gradual, starting  several  years ago with gradually worsening course since that time. The patient noted no past surgery on the left knee.  Patient currently rates pain in the left knee at 8 out of 10 with activity. Patient has night pain, worsening of pain with activity and weight bearing, pain with passive range of motion, and crepitus. Patient has evidence of  bone-on-bone arthritis in the medial and patellofemoral  by imaging studies. There is no active infection.  Patient Active Problem List   Diagnosis Date Noted   Rheumatoid factor positive 09/25/2022   Generalized osteoarthritis of multiple sites 09/25/2022   ILD (interstitial lung disease) (HCC) 06/04/2022   Malignant neoplasm of upper-inner quadrant of right breast in female, estrogen receptor positive (HCC) 03/14/2020   Sinoatrial node dysfunction (HCC) 02/08/2013   Allergic rhinitis 02/11/2012   Lung mass 02/11/2012   Pacemaker-MDT    Cough 01/21/2012   Endometriosis    Osteopenia    Hypertension 03/26/2011    Past Medical History:  Diagnosis Date   ACE inhibitor intolerance    Arthritis    Asthma    outgrew as a child   Breast cancer (HCC)    Cancer (HCC)    right breast   Early cataract    Endometriosis    Gout    Heart disease    Hypertension    Interstitial lung disease (HCC)    Osteopenia    Pacemaker-MDT    DOI-2002, Generator change 2010   Pneumonia    PVC (premature ventricular contraction)    Sinus node dysfunction (HCC)     Wears glasses     Past Surgical History:  Procedure Laterality Date   BREAST LUMPECTOMY     Benign   BREAST LUMPECTOMY WITH RADIOACTIVE SEED LOCALIZATION Right 03/27/2020   Procedure: RIGHT BREAST LUMPECTOMY WITH RADIOACTIVE SEED LOCALIZATION;  Surgeon: Curvin Deward MOULD, MD;  Location: [facility] OR;  Service: General;  Laterality: Right;   CORONARY ANGIOPLASTY     DILATION AND CURETTAGE OF UTERUS     GALLBLADDER SURGERY     HYSTEROSCOPY     PACEMAKER INSERTION     Medtronic   TONSILLECTOMY     TUBAL LIGATION     VAGINAL HYSTERECTOMY     WISDOM TOOTH EXTRACTION      Prior to Admission medications   Medication Sig Start Date End Date Taking? Authorizing Provider  allopurinol  (ZYLOPRIM ) 100 MG tablet Take 200 mg by mouth daily. 10/11/19  Yes [provider]  b complex vitamins capsule Take 1 capsule by mouth daily.   Yes [provider]  celecoxib  (CELEBREX ) 200 MG capsule Take 200 mg by mouth daily.   Yes [provider]  Cholecalciferol (VITAMIN D PO) Take 2,000 Units by mouth daily.    Yes [provider]  citalopram  (CELEXA ) 20 MG tablet Take 1 tablet (20 mg total) by mouth daily. 03/26/11  Yes Tisa Bars, MD  loratadine  (CLARITIN ) 10 MG tablet Take 10 mg by mouth daily.   Yes [provider]  MAGNESIUM PO Take 1 tablet by mouth  daily.   Yes [provider]  nebivolol  (BYSTOLIC ) 10 MG tablet Take 10 mg by mouth daily.   Yes [provider]  omeprazole (PRILOSEC) 20 MG capsule Take 20 mg by mouth daily.   Yes [provider]  tamoxifen  (NOLVADEX ) 10 MG tablet Take 1 tablet (10 mg total) by mouth daily. 09/07/23  Yes Gudena, Vinay, MD  RSV vaccine recomb adjuvanted (AREXVY ) 120 MCG/0.5ML injection Inject into the muscle. Patient not taking: Reported on 10/29/2023 09/07/22   Luiz Channel, MD    Allergies  Allergen Reactions   Augmentin [Amoxicillin-Pot Clavulanate] Diarrhea   Anastrozole      Other reaction(s):  couldn't sleep, Other (See Comments)   Febuxostat Other (See Comments)    Other reaction(s): made her itch. urine smelled bad. felt better off of it. but could go back on it if needed she says   Nitrofurantoin Other (See Comments)    Sweating and chills Other reaction(s): achey, clammy. can take it but feels bad on it. Other reaction(s): Other (See Comments) Sweating and chills Sweating and chills    Pravastatin     Other reaction(s): felt miserable on it.felt hot and sweaty in the night.   Ramipril  Hives and Swelling      Other reaction(s): dr. Frutoso wanted it changed due to some allergy issues like swelling., Other (See Comments)   Thiazide-Type Diuretics     Other reaction(s): stop due to gout   Allopurinol  Rash   Molnupiravir Rash    Other reaction(s): rash    Social History   Socioeconomic History   Marital status: Married    Spouse name: Not on file   Number of children: Not on file   Years of education: Not on file   Highest education level: Not on file  Occupational History   Not on file  Tobacco Use   Smoking status: Former    Current packs/day: 0.00    Average packs/day: 0.3 packs/day for 10.0 years (3.0 ttl pk-yrs)    Types: Cigarettes    Start date: 10/27/1995    Quit date: 10/26/2005    Years since quitting: 18.0    Passive exposure: Never   Smokeless tobacco: Never  Vaping Use   Vaping status: Never Used  Substance and Sexual Activity   Alcohol  use: Yes    Alcohol /week: 12.0 standard drinks of alcohol     Types: 12 Glasses of wine per week    Comment: 2 glasses of wine daily   Drug use: No   Sexual activity: Yes    Birth control/protection: Surgical    Comment: INTERCOURSE AGE 56 , LESS THAN 5 SEXUAL PARTNERS  Other Topics Concern   Not on file  Social History Narrative   Not on file   Social Drivers of Health   Financial Resource Strain: Not on file  Food Insecurity: Not on file  Transportation Needs: Not on file  Physical Activity: Not on  file  Stress: Not on file  Social Connections: Not on file  Intimate Partner Violence: Not on file    Tobacco Use: Medium Risk (10/13/2023)   Received from [facility]   Patient History    Smoking Tobacco Use: Former    Smokeless Tobacco Use: Never    Passive Exposure: Not on file   Social History   Substance and Sexual Activity  Alcohol  Use Yes   Alcohol /week: 12.0 standard drinks of alcohol    Types: 12 Glasses of wine per week   Comment: 2 glasses of wine daily  Family History  Problem Relation Age of Onset   Hypertension Mother    Dupuytren's contracture Father    Stomach cancer Paternal Grandfather 4   Arrhythmia Neg Hx    Clotting disorder Neg Hx    Heart attack Neg Hx    Fainting Neg Hx    Anemia Neg Hx    Heart disease Neg Hx    Heart failure Neg Hx    Hyperlipidemia Neg Hx    Colon cancer Neg Hx     Review of Systems  Constitutional:  Negative for chills and fever.  HENT:  Negative for congestion, sore throat and tinnitus.   Eyes:  Negative for double vision, photophobia and pain.  Respiratory:  Negative for cough, shortness of breath and wheezing.   Cardiovascular:  Negative for chest pain, palpitations and orthopnea.  Gastrointestinal:  Negative for heartburn, nausea and vomiting.  Genitourinary:  Negative for dysuria, frequency and urgency.  Musculoskeletal:  Positive for joint pain.  Neurological:  Negative for dizziness, weakness and headaches.    Objective:  Physical Exam: Well nourished and well developed.  General: Alert and oriented x3, cooperative and pleasant, no acute distress.  Head: normocephalic, atraumatic, neck supple.  Eyes: EOMI.  Musculoskeletal:   Left knee: No effusion, varus deformity, range of motion 5-125 degrees, crepitus on range of motion, tender medially, no lateral tenderness or instability.  Calves soft and nontender. Motor function intact in LE. Strength 5/5 LE bilaterally. Neuro: Distal pulses 2+. Sensation to  light touch intact in LE.   Imaging Review Plain radiographs demonstrate severe degenerative joint disease of the left knee. The overall alignment is neutral. The bone quality appears to be adequate for age and reported activity level.  Assessment/Plan:  End stage arthritis, left knee   The patient history, physical examination, clinical judgment of the provider and imaging studies are consistent with end stage degenerative joint disease of the left knee and total knee arthroplasty is deemed medically necessary. The treatment options including medical management, injection therapy arthroscopy and arthroplasty were discussed at length. The risks and benefits of total knee arthroplasty were presented and reviewed. The risks due to aseptic loosening, infection, stiffness, patella tracking problems, thromboembolic complications and other imponderables were discussed. The patient acknowledged the explanation, agreed to proceed with the plan and consent was signed. Patient is being admitted for inpatient treatment for surgery, pain control, PT, OT, prophylactic antibiotics, VTE prophylaxis, progressive ambulation and ADLs and discharge planning. The patient is planning to be discharged  home .   Patient's anticipated LOS is less than 2 midnights, meeting these requirements: - Lives within 1 hour of care - Has a competent adult at home to recover with post-op recover - NO history of  - Chronic pain requiring opioids  - Diabetes  - Coronary Artery Disease  - Heart failure  - Heart attack  - Stroke  - DVT/VTE  - Respiratory Failure/COPD  - Renal failure  - Anemia  - Advanced Liver disease  Therapy Plans: Outpatient therapy at [facility] Disposition: Home with husband Planned DVT Prophylaxis: Xarelto 10 mg QD DME Needed: None PCP: Oneil Neth, MD (clearance received) Cardiologist: Elspeth Sage, MD (clearance received) TXA: IV Allergies: Ramipril  Metal Allergy: None Anesthesia Concerns: None BMI:  30.2 Last HgbA1c: Not diabetic Pain Regimen: Oxycodone  Pharmacy: [facility] (have brought to room)  Other: - PMH: Pacemaker, breast CA  - Patient was instructed on what medications to stop prior to surgery. - Follow-up visit in 2 weeks with Dr. Melodi -  Begin physical therapy following surgery - Pre-operative lab work as pre-surgical testing - Prescriptions will be provided in hospital at time of discharge  Roxie Mess, PA-C Orthopedic Surgery [facility] [place]

## 2023-11-02 NOTE — Progress Notes (Signed)
 PERIOPERATIVE PRESCRIPTION FOR IMPLANTED CARDIAC DEVICE PROGRAMMING  Patient Information: Name:  Judy Chapman  DOB:  1946-06-11  MRN:  [id number]    Planned Procedure:  Left Total knee arthroplasty  Surgeon: Dr. Dempsey Moan  Date of Procedure: 11-08-2023  Cautery will be used.  Position during surgery: Supine   Please send documentation back to:  [facility] (Fax# [phone])  Device Information:  Clinic EP Physician:  Elspeth Sage, MD   Device Type:  Pacemaker Manufacturer and Phone #:  Medtronic: [id number] Pacemaker Dependent?:  No. Date of Last Device Check:  08/12/2023 Normal Device Function?:  Yes.    Electrophysiologist's Recommendations:  Have magnet available. Provide continuous ECG monitoring when magnet is used or reprogramming is to be performed.  Procedure should not interfere with device function.  No device programming or magnet placement needed.  Per Device Clinic Standing Orders, [name], RN  12:15 PM 11/02/2023

## 2023-11-02 NOTE — Patient Instructions (Signed)
 SURGICAL WAITING ROOM VISITATION Patients having surgery or a procedure may have no more than 2 support people in the waiting area - these visitors may rotate in the visitor waiting room.   Due to an increase in RSV and influenza rates and associated hospitalizations, children ages 30 and under may not visit patients in [facility] hospitals. If the patient needs to stay at the hospital during part of their recovery, the visitor guidelines for inpatient rooms apply.  PRE-OP VISITATION  Pre-op nurse will coordinate an appropriate time for 1 support person to accompany the patient in pre-op.  This support person may not rotate.  This visitor will be contacted when the time is appropriate for the visitor to come back in the pre-op area.  Please refer to the [facility] website for the visitor guidelines for Inpatients (after your surgery is over and you are in a regular room).  You are not required to quarantine at this time prior to your surgery. However, you must do this: Hand Hygiene often Do NOT share personal items Notify your provider if you are in close contact with someone who has COVID or you develop fever 100.4 or greater, new onset of sneezing, cough, sore throat, shortness of breath or body aches.  If you test positive for Covid or have been in contact with anyone that has tested positive in the last 10 days please notify you surgeon.    Your procedure is scheduled on:  MONDAY    November 08, 2023  Report to [facility] Main Entrance: [address] entrance where the [employer] is available.   Report to admitting at: 08:00    AM  Call this number if you have any questions or problems the morning of surgery [phone]  Do not eat food after Midnight the night prior to your surgery/procedure.  After Midnight you may have the following liquids until  07:30 AM  DAY OF SURGERY  Clear Liquid Diet Water Black Coffee (sugar ok, NO MILK/CREAM OR CREAMERS)  Tea (sugar ok, NO  MILK/CREAM OR CREAMERS) regular and decaf                             Plain Jell-O  with no fruit (NO RED)                                           Fruit ices (not with fruit pulp, NO RED)                                     Popsicles (NO RED)                                                                  Juice: NO CITRUS JUICES: only apple, WHITE grape, WHITE cranberry Sports drinks like Gatorade or Powerade (NO RED)                   The day of surgery:  Drink ONE (1) Pre-Surgery Clear Ensure at 07:30 AM the morning  of surgery. Drink in one sitting. Do not sip.  This drink was given to you during your hospital pre-op appointment visit. Nothing else to drink after completing the Pre-Surgery Clear Ensure : No candy, chewing gum or throat lozenges.    FOLLOW ANY ADDITIONAL PRE OP INSTRUCTIONS YOU RECEIVED FROM YOUR SURGEON'S OFFICE!!!   Oral Hygiene is also important to reduce your risk of infection.        Remember - BRUSH YOUR TEETH THE MORNING OF SURGERY WITH YOUR REGULAR TOOTHPASTE  Do NOT smoke after Midnight the night before surgery.  STOP TAKING all Vitamins, Herbs and supplements 1 week before your surgery.   Take ONLY these medicines the morning of surgery with A SIP OF WATER: omeprazole, allopurinol , nebivolol ,  tamoxifen , loratadine , citalopram                    You may not have any metal on your body including hair pins, jewelry, and body piercing  Do not wear make-up, lotions, powders, perfumes or deodorant  Do not wear nail polish including gel and S&S, artificial / acrylic nails, or any other type of covering on natural nails including finger and toenails. If you have artificial nails, gel coating, etc., that needs to be removed by a nail salon, Please have this removed prior to surgery. Not doing so may mean that your surgery could be cancelled or delayed if the Surgeon or anesthesia staff feels like they are unable to monitor you safely.   Do not shave 48 hours  prior to surgery to avoid nicks in your skin which may contribute to postoperative infections.   Contacts, Hearing Aids, dentures or bridgework may not be worn into surgery. DENTURES WILL BE REMOVED PRIOR TO SURGERY PLEASE DO NOT APPLY Poly grip OR ADHESIVES!!!  You may bring a small overnight bag with you on the day of surgery, only pack items that are not valuable. [facility] IS NOT RESPONSIBLE   FOR VALUABLES THAT ARE LOST OR STOLEN.   Do not bring your home medications to the hospital. The Pharmacy will dispense medications listed on your medication list to you during your admission in the Hospital.  Special Instructions: Bring a copy of your healthcare power of attorney and living will documents the day of surgery, if you wish to have them scanned into your [facility] Records- EPIC  Please read over the following fact sheets you were given: IF YOU HAVE QUESTIONS ABOUT YOUR PRE-OP INSTRUCTIONS, PLEASE CALL [phone].     Pre-operative 5 CHG Bath Instructions   You can play a key role in reducing the risk of infection after surgery. Your skin needs to be as free of germs as possible. You can reduce the number of germs on your skin by washing with CHG (chlorhexidine  gluconate) soap before surgery. CHG is an antiseptic soap that kills germs and continues to kill germs even after washing.   DO NOT use if you have an allergy to chlorhexidine /CHG or antibacterial soaps. If your skin becomes reddened or irritated, stop using the CHG and notify one of our RNs at [phone]  Please shower with the CHG soap starting 4 days before surgery using the following schedule: START SHOWERS ON   THURSDAY   November 04, 2023  Please keep in mind the following:  DO NOT shave, including legs and underarms, starting the day of  your first shower.   You may shave your face at any point before/day of surgery.   Place clean sheets on your bed the day you start using CHG soap. Use a clean washcloth (not used since being washed) for each shower. DO NOT sleep with pets once you start using the CHG.   CHG Shower Instructions:  If you choose to wash your hair and private area, wash first with your normal shampoo/soap.  After you use shampoo/soap, rinse your hair and body thoroughly to remove shampoo/soap residue.  Turn the water OFF and apply about 3 tablespoons (45 ml) of CHG soap to a CLEAN washcloth.  Apply CHG soap ONLY FROM YOUR NECK DOWN TO YOUR TOES (washing for 3-5 minutes)  DO NOT use CHG soap on face, private areas, open wounds, or sores.  Pay special attention to the area where your surgery is being performed.  If you are having back surgery, having someone wash your back for you may be helpful.  Wait 2 minutes after CHG soap is applied, then you may rinse off the CHG soap.  Pat dry with a clean towel  Put on clean clothes/pajamas   If you choose to wear lotion, please use ONLY the CHG-compatible lotions on the back of this paper.     Additional instructions for the day of surgery: DO NOT APPLY any lotions, deodorants, cologne, or perfumes.   Put on clean/comfortable clothes.  Brush your teeth.  Ask your nurse before applying any prescription medications to the skin.      CHG Compatible Lotions   Aveeno Moisturizing lotion  Cetaphil Moisturizing Cream  Cetaphil Moisturizing Lotion  Clairol Herbal Essence Moisturizing Lotion, Dry Skin  Clairol Herbal Essence Moisturizing Lotion, Extra Dry Skin  Clairol Herbal Essence Moisturizing Lotion, Normal Skin  Curel Age Defying Therapeutic Moisturizing Lotion with Alpha Hydroxy  Curel Extreme Care Body Lotion  Curel Soothing Hands Moisturizing Hand Lotion  Curel Therapeutic Moisturizing Cream, Fragrance-Free  Curel Therapeutic Moisturizing Lotion,  Fragrance-Free  Curel Therapeutic Moisturizing Lotion, Original Formula  Eucerin Daily Replenishing Lotion  Eucerin Dry Skin Therapy Plus Alpha Hydroxy Crme  Eucerin Dry Skin Therapy Plus Alpha Hydroxy Lotion  Eucerin Original Crme  Eucerin Original Lotion  Eucerin Plus Crme Eucerin Plus Lotion  Eucerin TriLipid Replenishing Lotion  Keri Anti-Bacterial Hand Lotion  Keri Deep Conditioning Original Lotion Dry Skin Formula Softly Scented  Keri Deep Conditioning Original Lotion, Fragrance Free Sensitive Skin Formula  Keri Lotion Fast Absorbing Fragrance Free Sensitive Skin Formula  Keri Lotion Fast Absorbing Softly Scented Dry Skin Formula  Keri Original Lotion  Keri Skin Renewal Lotion Keri Silky Smooth Lotion  Keri Silky Smooth Sensitive Skin Lotion  Nivea Body Creamy Conditioning Oil  Nivea Body Extra Enriched Lotion  Nivea Body Original Lotion  Nivea Body Sheer Moisturizing Lotion Nivea Crme  Nivea Skin Firming Lotion  NutraDerm 30 Skin Lotion  NutraDerm Skin Lotion  NutraDerm Therapeutic Skin Cream  NutraDerm Therapeutic Skin Lotion  ProShield Protective Hand Cream  Provon moisturizing lotion   FAILURE TO FOLLOW THESE INSTRUCTIONS MAY RESULT IN THE CANCELLATION OF YOUR SURGERY  PATIENT SIGNATURE_________________________________  NURSE SIGNATURE__________________________________  ________________________________________________________________________      Nasario Exon    An incentive spirometer is a tool that can help keep your lungs clear and active. This tool measures how well you are filling your lungs with each breath. Taking long  deep breaths may help reverse or decrease the chance of developing breathing (pulmonary) problems (especially infection) following: A long period of time when you are unable to move or be active. BEFORE THE PROCEDURE  If the spirometer includes an indicator to show your best effort, your nurse or respiratory therapist will  set it to a desired goal. If possible, sit up straight or lean slightly forward. Try not to slouch. Hold the incentive spirometer in an upright position. INSTRUCTIONS FOR USE  Sit on the edge of your bed if possible, or sit up as far as you can in bed or on a chair. Hold the incentive spirometer in an upright position. Breathe out normally. Place the mouthpiece in your mouth and seal your lips tightly around it. Breathe in slowly and as deeply as possible, raising the piston or the ball toward the top of the column. Hold your breath for 3-5 seconds or for as long as possible. Allow the piston or ball to fall to the bottom of the column. Remove the mouthpiece from your mouth and breathe out normally. Rest for a few seconds and repeat Steps 1 through 7 at least 10 times every 1-2 hours when you are awake. Take your time and take a few normal breaths between deep breaths. The spirometer may include an indicator to show your best effort. Use the indicator as a goal to work toward during each repetition. After each set of 10 deep breaths, practice coughing to be sure your lungs are clear. If you have an incision (the cut made at the time of surgery), support your incision when coughing by placing a pillow or rolled up towels firmly against it. Once you are able to get out of bed, walk around indoors and cough well. You may stop using the incentive spirometer when instructed by your caregiver.  RISKS AND COMPLICATIONS Take your time so you do not get dizzy or light-headed. If you are in pain, you may need to take or ask for pain medication before doing incentive spirometry. It is harder to take a deep breath if you are having pain. AFTER USE Rest and breathe slowly and easily. It can be helpful to keep track of a log of your progress. Your caregiver can provide you with a simple table to help with this. If you are using the spirometer at home, follow these instructions: SEEK MEDICAL CARE IF:  You  are having difficultly using the spirometer. You have trouble using the spirometer as often as instructed. Your pain medication is not giving enough relief while using the spirometer. You develop fever of 100.5 F (38.1 C) or higher.                                                                                                    SEEK IMMEDIATE MEDICAL CARE IF:  You cough up bloody sputum that had not been present before. You develop fever of 102 F (38.9 C) or greater. You develop worsening pain at or near the incision site. MAKE SURE YOU:  Understand these instructions. Will watch your condition. Will  get help right away if you are not doing well or get worse. Document Released: 02/22/2007 Document Revised: 01/04/2012 Document Reviewed: 04/25/2007 [facility] Patient Information 2014 [place], [place].

## 2023-11-02 NOTE — Progress Notes (Addendum)
 COVID Vaccine received:  []  No [x]  Yes Date of any COVID positive Test in last 90 days:  none  PCP - Oneil Neth, MD  medical clearance in Media scanned 09-08-2023 Cardiologist - Elspeth Sage, MD,  Orren Fabry, PA-C cardiac clearance in 10-25-2023  note Oncology- Mackey Chad, MD   Chest x-ray - 03-18-2022  2v  Epic      CT Chest High Resolution-  06-11-2023  Epic EKG - 06-30-2023  Epic  Stress Test -  ECHO -  Cardiac Cath -   PCR screen: [x]  Ordered & Completed []   No Order but Needs PROFEND     []   N/A for this surgery  Surgery Plan:  []  Ambulatory   [x]  Outpatient in bed  []  Admit Anesthesia:    []  General  []  Spinal  [x]   Choice []   MAC  Pacemaker / ICD device []  No [x]  Yes  MERM  ADDRL1 Adapta  Last check 08-12-23 (near EOL)   Device order requested  Spinal Cord Stimulator:[x]  No []  Yes       History of Sleep Apnea? [x]  No []  Yes   CPAP used?- [x]  No []  Yes    Does the patient monitor blood sugar?   [x]  N/A   []  No []  Yes  Patient has: [x]  NO Hx DM   []  Pre-DM   []  DM1  []   DM2  Blood Thinner / Instructions:  none Aspirin  Instructions:  none  ERAS Protocol Ordered: []  No  [x]  Yes PRE-SURGERY [x]  ENSURE  []  G2    Patient is to be NPO after: 07:30  Dental hx: []  Dentures:  [x]  N/A      []  Bridge or Partial:                   []  Loose or Damaged teeth:   Comments: Patient was given the 5 CHG shower / bath instructions for TKA surgery along with 2 bottles of the CHG soap. Patient will start this on:  11-04-2023    All questions were asked and answered, Patient voiced understanding of this process.   Activity level: Patient is able to climb a flight of stairs without difficulty; [x]  No CP  [x]  No SOB, but would have leg pain.  Patient can  perform ADLs without assistance.   Anesthesia review: PPM- for SSS- last check (close to EOL) next check appt is on 11-11-2023, ILD, HTN, GERD, s/p right lumpectomy  Patient denies shortness of breath, fever, cough and chest pain at PAT  appointment.  Patient verbalized understanding and agreement to the Pre-Surgical Instructions that were given to them at this PAT appointment. Patient was also educated of the need to review these PAT instructions again prior to her surgery.I reviewed the appropriate phone numbers to call if they have any and questions or concerns.

## 2023-11-03 ENCOUNTER — Other Ambulatory Visit: Payer: Self-pay

## 2023-11-03 ENCOUNTER — Encounter (HOSPITAL_COMMUNITY)
Admission: RE | Admit: 2023-11-03 | Discharge: 2023-11-03 | Disposition: A | Discharge status: Home / Self Care | Payer: Medicare PPO | Source: Ambulatory Visit | Attending: Orthopedic Surgery | Admitting: Orthopedic Surgery

## 2023-11-03 ENCOUNTER — Encounter (HOSPITAL_COMMUNITY): Payer: Self-pay

## 2023-11-03 VITALS — BP 118/55 | HR 62 | Temp 98.3°F | Resp 16 | Ht 64.0 in | Wt 176.0 lb

## 2023-11-03 DIAGNOSIS — Z87891 Personal history of nicotine dependence: Secondary | ICD-10-CM | POA: Insufficient documentation

## 2023-11-03 DIAGNOSIS — M1712 Unilateral primary osteoarthritis, left knee: Secondary | ICD-10-CM | POA: Insufficient documentation

## 2023-11-03 DIAGNOSIS — R768 Other specified abnormal immunological findings in serum: Secondary | ICD-10-CM | POA: Insufficient documentation

## 2023-11-03 DIAGNOSIS — I10 Essential (primary) hypertension: Secondary | ICD-10-CM | POA: Insufficient documentation

## 2023-11-03 DIAGNOSIS — Z950 Presence of cardiac pacemaker: Secondary | ICD-10-CM | POA: Diagnosis not present

## 2023-11-03 DIAGNOSIS — J45909 Unspecified asthma, uncomplicated: Secondary | ICD-10-CM | POA: Diagnosis not present

## 2023-11-03 DIAGNOSIS — Z01812 Encounter for preprocedural laboratory examination: Secondary | ICD-10-CM | POA: Insufficient documentation

## 2023-11-03 DIAGNOSIS — Z01818 Encounter for other preprocedural examination: Secondary | ICD-10-CM

## 2023-11-03 DIAGNOSIS — Z79899 Other long term (current) drug therapy: Secondary | ICD-10-CM

## 2023-11-03 DIAGNOSIS — J849 Interstitial pulmonary disease, unspecified: Secondary | ICD-10-CM | POA: Insufficient documentation

## 2023-11-03 HISTORY — DX: Cardiac arrhythmia, unspecified: I49.9

## 2023-11-03 HISTORY — DX: Gastro-esophageal reflux disease without esophagitis: K21.9

## 2023-11-03 LAB — COMPREHENSIVE METABOLIC PANEL
ALT: 21 U/L (ref 0–44)
AST: 27 U/L (ref 15–41)
Albumin: 3.7 g/dL (ref 3.5–5.0)
Alkaline Phosphatase: 39 U/L (ref 38–126)
Anion gap: 7 (ref 5–15)
BUN: 25 mg/dL — ABNORMAL HIGH (ref 8–23)
CO2: 26 mmol/L (ref 22–32)
Calcium: 8.5 mg/dL — ABNORMAL LOW (ref 8.9–10.3)
Chloride: 109 mmol/L (ref 98–111)
Creatinine, Ser: 0.91 mg/dL (ref 0.44–1.00)
GFR, Estimated: >60 mL/min (ref >60)
Glucose, Bld: 95 mg/dL (ref 70–99)
Potassium: 3.8 mmol/L (ref 3.5–5.1)
Sodium: 142 mmol/L (ref 135–145)
Total Bilirubin: 0.7 mg/dL (ref 0.0–1.2)
Total Protein: 6.5 g/dL (ref 6.5–8.1)

## 2023-11-03 LAB — SURGICAL PCR SCREEN
MRSA, PCR: NEGATIVE
Staphylococcus aureus: NEGATIVE

## 2023-11-03 LAB — CBC
HCT: 40.5 % (ref 36.0–46.0)
Hemoglobin: 12.9 g/dL (ref 12.0–15.0)
MCH: 33.6 pg (ref 26.0–34.0)
MCHC: 31.9 g/dL (ref 30.0–36.0)
MCV: 105.5 fL — ABNORMAL HIGH (ref 80.0–100.0)
Platelets: 195 10*3/uL (ref 150–400)
RBC: 3.84 MIL/uL — ABNORMAL LOW (ref 3.87–5.11)
RDW: 13.3 % (ref 11.5–15.5)
WBC: 4.9 10*3/uL (ref 4.0–10.5)
nRBC: 0 % (ref 0.0–0.2)

## 2023-11-04 NOTE — Anesthesia Preprocedure Evaluation (Addendum)
 Anesthesia Evaluation  Patient identified by MRN, date of birth, ID band Patient awake    Reviewed: Allergy & Precautions, NPO status , Patient's Chart, lab work & pertinent test results  History of Anesthesia Complications Negative for: history of anesthetic complications  Airway Mallampati: III  TM Distance: >3 FB Neck ROM: Full    Dental  (+) Caps, Dental Advisory Given,    Pulmonary neg shortness of breath, asthma (does not use inhalers) , neg sleep apnea, neg COPD, neg recent URI, former smoker ILD   Pulmonary exam normal breath sounds clear to auscultation       Cardiovascular hypertension (nebivolol ), Pt. on home beta blockers (-) angina (-) Past MI, (-) Cardiac Stents and (-) CABG + dysrhythmias (PVCs, SSS) + pacemaker  Rhythm:Regular Rate:Normal     Neuro/Psych neg Seizures negative neurological ROS     GI/Hepatic Neg liver ROS,GERD  Medicated,,  Endo/Other  negative endocrine ROS    Renal/GU negative Renal ROS     Musculoskeletal  (+) Arthritis , Osteoarthritis,  Osteopenia    Abdominal   Peds  Hematology negative hematology ROS (+) Lab Results      Component                Value               Date                      WBC                      4.9                 11/03/2023                HGB                      12.9                11/03/2023                HCT                      40.5                11/03/2023                MCV                      105.5 (H)           11/03/2023                PLT                      195                 11/03/2023              Anesthesia Other Findings   Reproductive/Obstetrics H/o breast cancer, endometriosis                             Anesthesia Physical Anesthesia Plan  ASA: 3  Anesthesia Plan: Spinal and MAC   Post-op Pain Management: Regional block* and Tylenol  PO (pre-op)*   Induction: Intravenous  PONV Risk Score and  Plan: 2 and Ondansetron , Dexamethasone  and Treatment may vary due  to age or medical condition  Airway Management Planned: Natural Airway and Simple Face Mask  Additional Equipment:   Intra-op Plan:   Post-operative Plan:   Informed Consent: I have reviewed the patients History and Physical, chart, labs and discussed the procedure including the risks, benefits and alternatives for the proposed anesthesia with the patient or authorized representative who has indicated his/her understanding and acceptance.     Dental advisory given  Plan Discussed with: CRNA and Anesthesiologist  Anesthesia Plan Comments: (See PAT note 11/03/2023  Discussed potential risks of nerve blocks including, but not limited to, infection, bleeding, nerve damage, seizures, pneumothorax, respiratory depression, and potential failure of the block. Alternatives to nerve blocks discussed. All questions answered.  I have discussed risks of neuraxial anesthesia including but not limited to infection, bleeding, nerve injury, back pain, headache, seizures, and failure of block. Patient denies bleeding disorders and is not currently anticoagulated. Labs have been reviewed. Risks and benefits discussed. All patient's questions answered.   Discussed with patient risks of MAC including, but not limited to, minor pain or discomfort, hearing people in the room, and possible need for backup general anesthesia. Risks for general anesthesia also discussed including, but not limited to, sore throat, hoarse voice, chipped/damaged teeth, injury to vocal cords, nausea and vomiting, allergic reactions, lung infection, heart attack, stroke, and death. All questions answered. )       Anesthesia Quick Evaluation

## 2023-11-04 NOTE — Progress Notes (Signed)
 Anesthesia Chart Review   Case: [id number] Date/Time: 11/08/23 1015   Procedure: TOTAL KNEE ARTHROPLASTY (Left: Knee)   Anesthesia type: Choice   Pre-op diagnosis: left knee osteoarthritis   Location: [facility] ROOM 10 / [facility]   Surgeons: Melodi Lerner, MD       DISCUSSION:78 y.o. former smoker with h/o HTN, ILD, asthma, PVC, pacemaker in place (device orders in 11/02/2023 progress note), left knee OA scheduled for above procedure 11/08/2023 with Dr. Lerner Melodi.   Pt last seen by cardiology 10/25/2023. Per OV note, Ms. Kiener's perioperative risk of a major cardiac event is 0.4% according to the Revised Cardiac Risk Index (RCRI).  Therefore, she is at low risk for perioperative complications.   Her functional capacity is good at 5.07 METs according to the Duke Activity Status Index (DASI). Recommendations: According to ACC/AHA guidelines, no further cardiovascular testing needed.  The patient may proceed to surgery at acceptable risk.    VS: BP (!) 118/55 Comment: right arm sitting  Pulse 62   Temp 36.8 C (Oral)   Resp 16   Ht 5' 4 (1.626 m)   Wt 79.8 kg   SpO2 98%   BMI 30.21 kg/m   PROVIDERS: Shayne Anes, MD is PCP   Cardiologist - Elspeth Sage, MD  LABS: Labs reviewed: Acceptable for surgery. (all labs ordered are listed, but only abnormal results are displayed)  Labs Reviewed  COMPREHENSIVE METABOLIC PANEL - Abnormal; Notable for the following components:      Result Value   BUN 25 (*)    Calcium 8.5 (*)    All other components within normal limits  CBC - Abnormal; Notable for the following components:   RBC 3.84 (*)    MCV 105.5 (*)    All other components within normal limits  SURGICAL PCR SCREEN     IMAGES:   EKG:   CV: Echo 04/03/2022 1. Left ventricular ejection fraction, by estimation, is 55 to 60%. The  left ventricle has normal function. The left ventricle has no regional  wall motion abnormalities. There is mild left ventricular hypertrophy.  Left  ventricular diastolic parameters  are consistent with Grade I diastolic dysfunction (impaired relaxation).   2. Right ventricular systolic function is normal. The right ventricular  size is normal. Tricuspid regurgitation signal is inadequate for assessing  PA pressure.   3. The mitral valve is grossly normal. Mild to moderate mitral valve  regurgitation. No evidence of mitral stenosis.   4. Tricuspid valve regurgitation is mild to moderate.   5. The aortic valve is tricuspid. Aortic valve regurgitation is moderate,  PHT 392 ms, vena contracta 0.46 cm. Central jet of AI. No aortic stenosis  is present.   6. The inferior vena cava is normal in size with greater than 50%  respiratory variability, suggesting right atrial pressure of 3 mmHg.  Past Medical History:  Diagnosis Date   ACE inhibitor intolerance    Arthritis    Asthma    outgrew as a child   Breast cancer (HCC)    Cancer (HCC)    right breast   Dysrhythmia    SSS   Early cataract    Endometriosis    GERD (gastroesophageal reflux disease)    Gout    Heart disease    Hypertension    Interstitial lung disease (HCC)    Osteopenia    Pacemaker-MDT    DOI-2002, Generator change 2010   Pneumonia    PVC (premature ventricular contraction)  Sinus node dysfunction (HCC)    Wears glasses     Past Surgical History:  Procedure Laterality Date   BREAST LUMPECTOMY WITH RADIOACTIVE SEED LOCALIZATION Right 03/27/2020   Procedure: RIGHT BREAST LUMPECTOMY WITH RADIOACTIVE SEED LOCALIZATION;  Surgeon: Curvin Deward MOULD, MD;  Location: [facility];  Service: General;  Laterality: Right;   CORONARY ANGIOPLASTY     DILATION AND CURETTAGE OF UTERUS     EYE SURGERY Bilateral    cataract extraction w/ IOL   GALLBLADDER SURGERY     HYSTEROSCOPY     PACEMAKER INSERTION     Medtronic   TONSILLECTOMY     TUBAL LIGATION     VAGINAL HYSTERECTOMY     WISDOM TOOTH EXTRACTION      MEDICATIONS:  allopurinol  (ZYLOPRIM ) 100 MG tablet   b  complex vitamins capsule   celecoxib  (CELEBREX ) 200 MG capsule   Cholecalciferol (VITAMIN D PO)   citalopram  (CELEXA ) 20 MG tablet   loratadine  (CLARITIN ) 10 MG tablet   MAGNESIUM PO   nebivolol  (BYSTOLIC ) 10 MG tablet   omeprazole (PRILOSEC) 20 MG capsule   RSV vaccine recomb adjuvanted (AREXVY ) 120 MCG/0.5ML injection   tamoxifen  (NOLVADEX ) 10 MG tablet   No current facility-administered medications for this encounter.     Harlene Hoots Ward, PA-C [facility] [phone]

## 2023-11-08 ENCOUNTER — Encounter (HOSPITAL_COMMUNITY)
Admission: RE | Disposition: A | Discharge status: Home / Self Care | Payer: Self-pay | Source: Home / Self Care | Attending: Orthopedic Surgery

## 2023-11-08 ENCOUNTER — Encounter (HOSPITAL_COMMUNITY): Payer: Self-pay | Admitting: Orthopedic Surgery

## 2023-11-08 ENCOUNTER — Observation Stay (HOSPITAL_COMMUNITY)
Admission: RE | Admit: 2023-11-08 | Discharge: 2023-11-09 | Disposition: A | Discharge status: Home / Self Care | Payer: Medicare PPO | Attending: Orthopedic Surgery | Admitting: Orthopedic Surgery

## 2023-11-08 ENCOUNTER — Ambulatory Visit (HOSPITAL_COMMUNITY): Payer: Self-pay | Admitting: Physician Assistant

## 2023-11-08 ENCOUNTER — Other Ambulatory Visit: Payer: Self-pay

## 2023-11-08 ENCOUNTER — Ambulatory Visit (HOSPITAL_BASED_OUTPATIENT_CLINIC_OR_DEPARTMENT_OTHER): Payer: Medicare PPO | Admitting: Anesthesiology

## 2023-11-08 DIAGNOSIS — Z79899 Other long term (current) drug therapy: Secondary | ICD-10-CM | POA: Insufficient documentation

## 2023-11-08 DIAGNOSIS — I119 Hypertensive heart disease without heart failure: Secondary | ICD-10-CM | POA: Diagnosis not present

## 2023-11-08 DIAGNOSIS — Z950 Presence of cardiac pacemaker: Secondary | ICD-10-CM | POA: Insufficient documentation

## 2023-11-08 DIAGNOSIS — Z853 Personal history of malignant neoplasm of breast: Secondary | ICD-10-CM | POA: Insufficient documentation

## 2023-11-08 DIAGNOSIS — I10 Essential (primary) hypertension: Secondary | ICD-10-CM | POA: Diagnosis not present

## 2023-11-08 DIAGNOSIS — J449 Chronic obstructive pulmonary disease, unspecified: Secondary | ICD-10-CM | POA: Diagnosis not present

## 2023-11-08 DIAGNOSIS — M1712 Unilateral primary osteoarthritis, left knee: Secondary | ICD-10-CM | POA: Diagnosis not present

## 2023-11-08 DIAGNOSIS — Z87891 Personal history of nicotine dependence: Secondary | ICD-10-CM | POA: Insufficient documentation

## 2023-11-08 DIAGNOSIS — J45909 Unspecified asthma, uncomplicated: Secondary | ICD-10-CM | POA: Diagnosis not present

## 2023-11-08 DIAGNOSIS — G8918 Other acute postprocedural pain: Secondary | ICD-10-CM | POA: Diagnosis not present

## 2023-11-08 DIAGNOSIS — M179 Osteoarthritis of knee, unspecified: Principal | ICD-10-CM | POA: Diagnosis present

## 2023-11-08 HISTORY — PX: REPLACEMENT TOTAL KNEE: SUR1224

## 2023-11-08 HISTORY — PX: TOTAL KNEE ARTHROPLASTY: SHX125

## 2023-11-08 SURGERY — ARTHROPLASTY, KNEE, TOTAL
Anesthesia: Monitor Anesthesia Care | Site: Knee | Laterality: Left

## 2023-11-08 MED ORDER — BUPIVACAINE LIPOSOME 1.3 % IJ SUSP: 20.0000 mL | Freq: Once | INTRAMUSCULAR | Status: DC

## 2023-11-08 MED ORDER — LORATADINE 10 MG PO TABS
10.0000 mg | ORAL_TABLET | Freq: Every day | ORAL | Status: DC
Start: 2023-11-08 — End: 2023-11-09
  Administered 2023-11-08 – 2023-11-09 (×2): 10 mg via ORAL
  Filled 2023-11-08 (×2): qty 1

## 2023-11-08 MED ORDER — PHENYLEPHRINE HCL-NACL 20-0.9 MG/250ML-% IV SOLN
INTRAVENOUS | Status: DC | PRN
  Administered 2023-11-08: 40 ug/min via INTRAVENOUS

## 2023-11-08 MED ORDER — CITALOPRAM HYDROBROMIDE 20 MG PO TABS
20.0000 mg | ORAL_TABLET | Freq: Every day | ORAL | Status: DC
  Administered 2023-11-08 – 2023-11-09 (×2): 20 mg via ORAL
  Filled 2023-11-08 (×2): qty 1

## 2023-11-08 MED ORDER — MORPHINE SULFATE (PF) 2 MG/ML IV SOLN
1.0000 mg | INTRAVENOUS | Status: DC | PRN
Start: 2023-11-08 — End: 2023-11-09

## 2023-11-08 MED ORDER — ALLOPURINOL 100 MG PO TABS
200.0000 mg | ORAL_TABLET | Freq: Every day | ORAL | Status: DC
Start: 2023-11-08 — End: 2023-11-09
  Administered 2023-11-08 – 2023-11-09 (×2): 200 mg via ORAL
  Filled 2023-11-08 (×2): qty 2

## 2023-11-08 MED ORDER — DEXAMETHASONE SODIUM PHOSPHATE 10 MG/ML IJ SOLN
INTRAMUSCULAR | Status: DC | PRN
  Administered 2023-11-08: 10 mg via INTRAVENOUS

## 2023-11-08 MED ORDER — DIPHENHYDRAMINE HCL 50 MG/ML IJ SOLN
INTRAMUSCULAR | Status: DC | PRN
  Administered 2023-11-08: 25 mg via INTRAVENOUS

## 2023-11-08 MED ORDER — BUPIVACAINE LIPOSOME 1.3 % IJ SUSP
INTRAMUSCULAR | Status: AC
  Filled 2023-11-08: qty 20

## 2023-11-08 MED ORDER — ASPIRIN 81 MG PO CHEW
81.0000 mg | CHEWABLE_TABLET | Freq: Two times a day (BID) | ORAL | Status: DC
  Administered 2023-11-09: 81 mg via ORAL
  Filled 2023-11-08: qty 1

## 2023-11-08 MED ORDER — OXYCODONE HCL 5 MG PO TABS: 5.0000 mg | ORAL_TABLET | Freq: Once | ORAL | Status: DC | PRN

## 2023-11-08 MED ORDER — PHENYLEPHRINE 80 MCG/ML (10ML) SYRINGE FOR IV PUSH (FOR BLOOD PRESSURE SUPPORT)
PREFILLED_SYRINGE | INTRAVENOUS | Status: DC | PRN
  Administered 2023-11-08: 80 ug via INTRAVENOUS

## 2023-11-08 MED ORDER — CEFAZOLIN SODIUM-DEXTROSE 2-4 GM/100ML-% IV SOLN
2.0000 g | INTRAVENOUS | Status: DC
  Filled 2023-11-08: qty 100

## 2023-11-08 MED ORDER — ACETAMINOPHEN 10 MG/ML IV SOLN
1000.0000 mg | Freq: Four times a day (QID) | INTRAVENOUS | Status: DC
  Filled 2023-11-08: qty 100

## 2023-11-08 MED ORDER — LACTATED RINGERS IV SOLN
INTRAVENOUS | Status: DC
Start: 2023-11-08 — End: 2023-11-08

## 2023-11-08 MED ORDER — EPHEDRINE SULFATE-NACL 50-0.9 MG/10ML-% IV SOSY
PREFILLED_SYRINGE | INTRAVENOUS | Status: DC | PRN
  Administered 2023-11-08: 10 mg via INTRAVENOUS

## 2023-11-08 MED ORDER — ONDANSETRON HCL 4 MG/2ML IJ SOLN: 4.0000 mg | Freq: Four times a day (QID) | INTRAMUSCULAR | Status: DC | PRN

## 2023-11-08 MED ORDER — FLEET ENEMA RE ENEM: 1.0000 | ENEMA | Freq: Once | RECTAL | Status: DC | PRN

## 2023-11-08 MED ORDER — METOCLOPRAMIDE HCL 5 MG/ML IJ SOLN: 5.0000 mg | Freq: Three times a day (TID) | INTRAMUSCULAR | Status: DC | PRN

## 2023-11-08 MED ORDER — BISACODYL 10 MG RE SUPP: 10.0000 mg | Freq: Every day | RECTAL | Status: DC | PRN

## 2023-11-08 MED ORDER — CHLORHEXIDINE GLUCONATE 0.12 % MT SOLN
15.0000 mL | Freq: Once | OROMUCOSAL | Status: AC
  Administered 2023-11-08: 15 mL via OROMUCOSAL

## 2023-11-08 MED ORDER — OXYCODONE HCL 5 MG PO TABS: 10.0000 mg | ORAL_TABLET | ORAL | Status: DC | PRN

## 2023-11-08 MED ORDER — 0.9 % SODIUM CHLORIDE (POUR BTL) OPTIME
TOPICAL | Status: DC | PRN
  Administered 2023-11-08: 1000 mL

## 2023-11-08 MED ORDER — ONDANSETRON HCL 4 MG/2ML IJ SOLN
INTRAMUSCULAR | Status: DC | PRN
  Administered 2023-11-08: 4 mg via INTRAVENOUS

## 2023-11-08 MED ORDER — ACETAMINOPHEN 500 MG PO TABS
1000.0000 mg | ORAL_TABLET | Freq: Four times a day (QID) | ORAL | Status: AC
  Administered 2023-11-08 – 2023-11-09 (×4): 1000 mg via ORAL
  Filled 2023-11-08 (×4): qty 2

## 2023-11-08 MED ORDER — AMISULPRIDE (ANTIEMETIC) 5 MG/2ML IV SOLN: 10.0000 mg | Freq: Once | INTRAVENOUS | Status: DC | PRN

## 2023-11-08 MED ORDER — ACETAMINOPHEN 325 MG PO TABS: 325.0000 mg | ORAL_TABLET | Freq: Four times a day (QID) | ORAL | Status: DC | PRN

## 2023-11-08 MED ORDER — POLYETHYLENE GLYCOL 3350 17 G PO PACK: 17.0000 g | PACK | Freq: Every day | ORAL | Status: DC | PRN

## 2023-11-08 MED ORDER — METHOCARBAMOL 1000 MG/10ML IJ SOLN: 500.0000 mg | Freq: Four times a day (QID) | INTRAMUSCULAR | Status: DC | PRN

## 2023-11-08 MED ORDER — OXYCODONE HCL 5 MG PO TABS
5.0000 mg | ORAL_TABLET | ORAL | Status: DC | PRN
Start: 2023-11-08 — End: 2023-11-09
  Administered 2023-11-08 – 2023-11-09 (×6): 5 mg via ORAL
  Filled 2023-11-08 (×6): qty 1

## 2023-11-08 MED ORDER — SODIUM CHLORIDE (PF) 0.9 % IJ SOLN
INTRAMUSCULAR | Status: AC
  Filled 2023-11-08: qty 50

## 2023-11-08 MED ORDER — MENTHOL 3 MG MT LOZG: 1.0000 | LOZENGE | OROMUCOSAL | Status: DC | PRN

## 2023-11-08 MED ORDER — FENTANYL CITRATE PF 50 MCG/ML IJ SOSY: 25.0000 ug | PREFILLED_SYRINGE | INTRAMUSCULAR | Status: DC | PRN

## 2023-11-08 MED ORDER — PHENOL 1.4 % MT LIQD: 1.0000 | OROMUCOSAL | Status: DC | PRN

## 2023-11-08 MED ORDER — ORAL CARE MOUTH RINSE: 15.0000 mL | Freq: Once | OROMUCOSAL | Status: AC

## 2023-11-08 MED ORDER — TAMOXIFEN CITRATE 10 MG PO TABS
10.0000 mg | ORAL_TABLET | Freq: Every day | ORAL | Status: DC
  Administered 2023-11-08 – 2023-11-09 (×2): 10 mg via ORAL
  Filled 2023-11-08 (×2): qty 1

## 2023-11-08 MED ORDER — DOCUSATE SODIUM 100 MG PO CAPS
100.0000 mg | ORAL_CAPSULE | Freq: Two times a day (BID) | ORAL | Status: DC
  Administered 2023-11-08 – 2023-11-09 (×2): 100 mg via ORAL
  Filled 2023-11-08 (×2): qty 1

## 2023-11-08 MED ORDER — DIPHENHYDRAMINE HCL 12.5 MG/5ML PO ELIX: 12.5000 mg | ORAL_SOLUTION | ORAL | Status: DC | PRN

## 2023-11-08 MED ORDER — ROPIVACAINE HCL 5 MG/ML IJ SOLN
INTRAMUSCULAR | Status: DC | PRN
  Administered 2023-11-08: 20 mL via PERINEURAL

## 2023-11-08 MED ORDER — PANTOPRAZOLE SODIUM 40 MG PO TBEC
40.0000 mg | DELAYED_RELEASE_TABLET | Freq: Every day | ORAL | Status: DC
  Administered 2023-11-08 – 2023-11-09 (×2): 40 mg via ORAL
  Filled 2023-11-08 (×2): qty 1

## 2023-11-08 MED ORDER — SODIUM CHLORIDE (PF) 0.9 % IJ SOLN
INTRAMUSCULAR | Status: DC | PRN
  Administered 2023-11-08: 80 mL

## 2023-11-08 MED ORDER — NEBIVOLOL HCL 10 MG PO TABS
10.0000 mg | ORAL_TABLET | Freq: Every day | ORAL | Status: DC
  Administered 2023-11-08 – 2023-11-09 (×2): 10 mg via ORAL
  Filled 2023-11-08 (×2): qty 1

## 2023-11-08 MED ORDER — TRANEXAMIC ACID-NACL 1000-0.7 MG/100ML-% IV SOLN
1000.0000 mg | INTRAVENOUS | Status: DC
  Filled 2023-11-08: qty 100

## 2023-11-08 MED ORDER — LIDOCAINE 2% (20 MG/ML) 5 ML SYRINGE
INTRAMUSCULAR | Status: DC | PRN
  Administered 2023-11-08: 60 mg via INTRAVENOUS

## 2023-11-08 MED ORDER — DEXAMETHASONE SODIUM PHOSPHATE 10 MG/ML IJ SOLN
10.0000 mg | Freq: Once | INTRAMUSCULAR | Status: AC
Start: 2023-11-09 — End: 2023-11-09
  Administered 2023-11-09: 10 mg via INTRAVENOUS
  Filled 2023-11-08: qty 1

## 2023-11-08 MED ORDER — CEFAZOLIN SODIUM-DEXTROSE 2-4 GM/100ML-% IV SOLN
2.0000 g | Freq: Four times a day (QID) | INTRAVENOUS | Status: AC
  Administered 2023-11-08 (×2): 2 g via INTRAVENOUS
  Filled 2023-11-08 (×2): qty 100

## 2023-11-08 MED ORDER — SODIUM CHLORIDE 0.9 % IR SOLN
Status: DC | PRN
  Administered 2023-11-08: 1000 mL

## 2023-11-08 MED ORDER — SODIUM CHLORIDE 0.9 % IV SOLN: INTRAVENOUS | Status: DC | PRN

## 2023-11-08 MED ORDER — PROPOFOL 500 MG/50ML IV EMUL
INTRAVENOUS | Status: DC | PRN
  Administered 2023-11-08: 60 mg via INTRAVENOUS
  Administered 2023-11-08: 75 ug/kg/min via INTRAVENOUS

## 2023-11-08 MED ORDER — METHOCARBAMOL 500 MG PO TABS
500.0000 mg | ORAL_TABLET | Freq: Four times a day (QID) | ORAL | Status: DC | PRN
  Administered 2023-11-08 – 2023-11-09 (×3): 500 mg via ORAL
  Filled 2023-11-08 (×3): qty 1

## 2023-11-08 MED ORDER — BUPIVACAINE IN DEXTROSE 0.75-8.25 % IT SOLN
INTRATHECAL | Status: DC | PRN
  Administered 2023-11-08: 1.4 mL via INTRATHECAL

## 2023-11-08 MED ORDER — METOCLOPRAMIDE HCL 5 MG PO TABS: 5.0000 mg | ORAL_TABLET | Freq: Three times a day (TID) | ORAL | Status: DC | PRN

## 2023-11-08 MED ORDER — ONDANSETRON HCL 4 MG PO TABS: 4.0000 mg | ORAL_TABLET | Freq: Four times a day (QID) | ORAL | Status: DC | PRN

## 2023-11-08 MED ORDER — DEXAMETHASONE SODIUM PHOSPHATE 10 MG/ML IJ SOLN: 8.0000 mg | Freq: Once | INTRAMUSCULAR | Status: DC

## 2023-11-08 MED ORDER — ACETAMINOPHEN 500 MG PO TABS: 1000.0000 mg | ORAL_TABLET | Freq: Once | ORAL | Status: DC

## 2023-11-08 MED ORDER — SODIUM CHLORIDE 0.9 % IV SOLN
INTRAVENOUS | Status: DC
Start: 2023-11-08 — End: 2023-11-09

## 2023-11-08 MED ORDER — OXYCODONE HCL 5 MG/5ML PO SOLN: 5.0000 mg | Freq: Once | ORAL | Status: DC | PRN

## 2023-11-08 MED ORDER — POVIDONE-IODINE 10 % EX SWAB
2.0000 | Freq: Once | CUTANEOUS | Status: AC
  Administered 2023-11-08: 2 via TOPICAL

## 2023-11-08 MED ORDER — FENTANYL CITRATE PF 50 MCG/ML IJ SOSY
50.0000 ug | PREFILLED_SYRINGE | Freq: Once | INTRAMUSCULAR | Status: AC
  Administered 2023-11-08: 50 ug via INTRAVENOUS
  Filled 2023-11-08: qty 2

## 2023-11-08 MED ORDER — LACTATED RINGERS IV SOLN: INTRAVENOUS | Status: DC

## 2023-11-08 SURGICAL SUPPLY — 45 items
ATTUNE PS FEM LT SZ 5 CEM KNEE (Femur) IMPLANT
ATTUNE PSRP INSR SZ 5 10M KNEE (Insert) IMPLANT
BAG COUNTER SPONGE SURGICOUNT (BAG) IMPLANT
BAG ZIPLOCK 12X15 (MISCELLANEOUS) ×1 IMPLANT
BASE TIBIAL ROT PLAT SZ 5 KNEE (Knees) IMPLANT
BLADE SAG 18X100X1.27 (BLADE) ×1 IMPLANT
BLADE SAW SGTL 11.0X1.19X90.0M (BLADE) ×1 IMPLANT
BNDG ELASTIC 6INX 5YD STR LF (GAUZE/BANDAGES/DRESSINGS) ×1 IMPLANT
BOWL SMART MIX CTS (DISPOSABLE) ×1 IMPLANT
CEMENT HV SMART SET (Cement) ×2 IMPLANT
COVER SURGICAL LIGHT HANDLE (MISCELLANEOUS) ×1 IMPLANT
CUFF TRNQT CYL 34X4.125X (TOURNIQUET CUFF) ×1 IMPLANT
DERMABOND ADVANCED .7 DNX12 (GAUZE/BANDAGES/DRESSINGS) ×1 IMPLANT
DRAPE U-SHAPE 47X51 STRL (DRAPES) ×1 IMPLANT
DRSG AQUACEL AG ADV 3.5X10 (GAUZE/BANDAGES/DRESSINGS) ×1 IMPLANT
DURAPREP 26ML APPLICATOR (WOUND CARE) ×1 IMPLANT
ELECT REM PT RETURN 15FT ADLT (MISCELLANEOUS) ×1 IMPLANT
GLOVE BIO SURGEON STRL SZ 6.5 (GLOVE) IMPLANT
GLOVE BIO SURGEON STRL SZ8 (GLOVE) ×1 IMPLANT
GLOVE BIOGEL PI IND STRL 6.5 (GLOVE) IMPLANT
GLOVE BIOGEL PI IND STRL 7.0 (GLOVE) IMPLANT
GLOVE BIOGEL PI IND STRL 8 (GLOVE) ×1 IMPLANT
GOWN STRL REUS W/ TWL LRG LVL3 (GOWN DISPOSABLE) ×1 IMPLANT
HOLDER FOLEY CATH W/STRAP (MISCELLANEOUS) IMPLANT
IMMOBILIZER KNEE 20 (SOFTGOODS) ×1
IMMOBILIZER KNEE 20 THIGH 36 (SOFTGOODS) ×1 IMPLANT
KIT TURNOVER KIT A (KITS) IMPLANT
MANIFOLD NEPTUNE II (INSTRUMENTS) ×1 IMPLANT
NS IRRIG 1000ML POUR BTL (IV SOLUTION) ×1 IMPLANT
PACK TOTAL KNEE CUSTOM (KITS) ×1 IMPLANT
PADDING CAST COTTON 6X4 STRL (CAST SUPPLIES) ×2 IMPLANT
PATELLA MEDIAL ATTUN 35MM KNEE (Knees) IMPLANT
PIN STEINMAN FIXATION KNEE (PIN) IMPLANT
PROTECTOR NERVE ULNAR (MISCELLANEOUS) ×1 IMPLANT
SET HNDPC FAN SPRY TIP SCT (DISPOSABLE) ×1 IMPLANT
SUT MNCRL AB 4-0 PS2 18 (SUTURE) ×1 IMPLANT
SUT STRATAFIX 0 PDS 27 VIOLET (SUTURE) ×1
SUT VIC AB 2-0 CT1 TAPERPNT 27 (SUTURE) ×3 IMPLANT
SUTURE STRATFX 0 PDS 27 VIOLET (SUTURE) ×1 IMPLANT
TIBIAL BASE ROT PLAT SZ 5 KNEE (Knees) ×1 IMPLANT
TRAY FOLEY MTR SLVR 14FR STAT (SET/KITS/TRAYS/PACK) IMPLANT
TRAY FOLEY MTR SLVR 16FR STAT (SET/KITS/TRAYS/PACK) IMPLANT
TUBE SUCTION HIGH CAP CLEAR NV (SUCTIONS) ×1 IMPLANT
WATER STERILE IRR 1000ML POUR (IV SOLUTION) ×2 IMPLANT
WRAP KNEE MAXI GEL POST OP (GAUZE/BANDAGES/DRESSINGS) ×1 IMPLANT

## 2023-11-08 NOTE — Interval H&P Note (Signed)
 History and Physical Interval Note:  11/08/2023 8:36 AM  Judy Chapman  has presented today for surgery, with the diagnosis of left knee osteoarthritis.  The various methods of treatment have been discussed with the patient and family. After consideration of risks, benefits and other options for treatment, the patient has consented to  Procedure(s): TOTAL KNEE ARTHROPLASTY (Left) as a surgical intervention.  The patient's history has been reviewed, patient examined, no change in status, stable for surgery.  I have reviewed the patient's chart and labs.  Questions were answered to the patient's satisfaction.     [name]

## 2023-11-08 NOTE — Anesthesia Procedure Notes (Signed)
 Anesthesia Regional Block: Adductor canal block   Pre-Anesthetic Checklist: , timeout performed,  Correct Patient, Correct Site, Correct Laterality,  Correct Procedure, Correct Position, site marked,  Risks and benefits discussed,  Surgical consent,  Pre-op evaluation,  At surgeon's request and post-op pain management  Laterality: Left  Prep: chloraprep       Needles:  Injection technique: Single-shot  Needle Type: Echogenic Stimulator Needle     Needle Length: 9cm  Needle Gauge: 21     Additional Needles:   Procedures:,,,, ultrasound used (permanent image in chart),,    Narrative:  Start time: 11/08/2023 9:25 AM End time: 11/08/2023 9:28 AM Injection made incrementally with aspirations every 5 mL.  Performed by: Personally  Anesthesiologist: Peggye Delon Brunswick, MD  Additional Notes: Discussed risks and benefits of nerve block including, but not limited to, prolonged and/or permanent nerve injury involving sensory and/or motor function. Monitors were applied and a time-out was performed. The nerve and associated structures were visualized under ultrasound guidance. After negative aspiration, local anesthetic was slowly injected around the nerve. There was no evidence of high pressure during the procedure. There were no paresthesias. VSS remained stable and the patient tolerated the procedure well.

## 2023-11-08 NOTE — Progress Notes (Signed)
 Orthopedic Tech Progress Note Patient Details:  Judy Chapman 05-18-1946 [id number] CPM was removed  CPM Left Knee CPM Left Knee: On Left Knee Flexion (Degrees): 40 Left Knee Extension (Degrees): 10  Post Interventions Patient Tolerated: Well  Massie BRAVO [name] 11/08/2023, 4:27 PM

## 2023-11-08 NOTE — Anesthesia Postprocedure Evaluation (Signed)
 Anesthesia Post Note  Patient: Judy Chapman  Procedure(s) Performed: TOTAL KNEE ARTHROPLASTY (Left: Knee)     Patient location during evaluation: PACU Anesthesia Type: MAC and Spinal Level of consciousness: awake Pain management: pain level controlled Vital Signs Assessment: post-procedure vital signs reviewed and stable Respiratory status: spontaneous breathing, respiratory function stable and nonlabored ventilation Cardiovascular status: blood pressure returned to baseline and stable Postop Assessment: no headache, no backache and no apparent nausea or vomiting Anesthetic complications: no   No notable events documented.  Last Vitals:  Vitals:   11/08/23 1340 11/08/23 1632  BP: 132/60 125/76  Pulse: 62 65  Resp: 16 16  Temp: 36.5 C 36.6 C  SpO2: 98% 98%    Last Pain:  Vitals:   11/08/23 1545  TempSrc:   PainSc: 3                  [name]

## 2023-11-08 NOTE — Evaluation (Signed)
 Physical Therapy Evaluation Patient Details Name: Judy Chapman MRN: [id number] DOB: 1946/06/28 Today's Date: 11/08/2023  History of Present Illness  78 yo female presents to therapy s/p L TKA on 11/08/2023 due to failure of conservative measures. Pt PMH RA positive factor, ILD, R Ba Ca, sinoatrial node dysfunction, pacemaker, cough, endometriosis, OP, HTN, and gout.  Clinical Impression   Brihana Quickel is a 78 y.o. female POD 0 s/p  L TKA. Patient reports IND with mobility at baseline. Patient is now limited by functional impairments (see PT problem list below) and requires S for bed mobility and CGA for transfers. Patient was able to ambulate 4 feet with RW and CGA level of assist. Patient instructed in exercise to facilitate ROM and circulation to manage edema. Patient will benefit from continued skilled PT interventions to address impairments and progress towards PLOF. Acute PT will follow to progress mobility and stair training in preparation for safe discharge home with family support and OPPT services.       If plan is discharge home, recommend the following: A little help with walking and/or transfers;A little help with bathing/dressing/bathroom;Assistance with cooking/housework;Assist for transportation;Help with stairs or ramp for entrance   Can travel by private vehicle        Equipment Recommendations None recommended by PT  Recommendations for Other Services       Functional Status Assessment Patient has had a recent decline in their functional status and demonstrates the ability to make significant improvements in function in a reasonable and predictable amount of time.     Precautions / Restrictions Precautions Precautions: Fall;Knee Restrictions Weight Bearing Restrictions Per Provider Order: No      Mobility  Bed Mobility Overal bed mobility: Needs Assistance Bed Mobility: Supine to Sit     Supine to sit: Supervision, HOB elevated, Used rails      General bed mobility comments: min cues    Transfers Overall transfer level: Needs assistance Equipment used: Rolling walker (2 wheels) Transfers: Sit to/from Stand Sit to Stand: Contact guard assist           General transfer comment: min cues with 2 attempts    Ambulation/Gait Ambulation/Gait assistance: Contact guard assist Gait Distance (Feet): 4 Feet Assistive device: Rolling walker (2 wheels) Gait Pattern/deviations: Step-to pattern, Antalgic Gait velocity: decreased     General Gait Details: gait limited due to report of pt feeling hot and clammy  Stairs            Wheelchair Mobility     Tilt Bed    Modified Rankin (Stroke Patients Only)       Balance Overall balance assessment: Needs assistance Sitting-balance support: Feet supported Sitting balance-Leahy Scale: Good     Standing balance support: Bilateral upper extremity supported, During functional activity, Reliant on assistive device for balance Standing balance-Leahy Scale: Poor                               Pertinent Vitals/Pain Pain Assessment Pain Assessment: 0-10 Pain Score: 4  Pain Location: L Knee and LE Pain Descriptors / Indicators: Aching, Constant, Grimacing, Operative site guarding Pain Intervention(s): Limited activity within patient's tolerance, Monitored during session, Premedicated before session, Repositioned, Ice applied    Home Living Family/patient expects to be discharged to:: Private residence Living Arrangements: Spouse/significant other Available Help at Discharge: Family Type of Home: House Home Access: Stairs to enter Entrance Stairs-Rails: None Entrance Stairs-Number of Steps: 1 (  one step to access kitchen) Alternate Level Stairs-Number of Steps: flight Home Layout: Two level;Able to live on main level with bedroom/bathroom Home Equipment: Rolling Walker (2 wheels);BSC/3in1 (iceman machine)      Prior Function Prior Level of Function :  Independent/Modified Independent             Mobility Comments: IND no AD for all ADLs, self care tasks and IADLs       Extremity/Trunk Assessment        Lower Extremity Assessment Lower Extremity Assessment: LLE deficits/detail LLE Deficits / Details: ankle DF/PF 5/5; SLR < 10 degree lag LLE Sensation: WNL    Cervical / Trunk Assessment Cervical / Trunk Assessment: Normal  Communication   Communication Communication: No apparent difficulties  Cognition Arousal: Alert Behavior During Therapy: WFL for tasks assessed/performed Overall Cognitive Status: Within Functional Limits for tasks assessed                                          General Comments      Exercises Total Joint Exercises Ankle Circles/Pumps: AROM, Both, 10 reps   Assessment/Plan    PT Assessment Patient needs continued PT services  PT Problem List Decreased strength;Decreased range of motion;Decreased activity tolerance;Decreased balance;Decreased mobility;Decreased coordination;Pain       PT Treatment Interventions DME instruction;Gait training;Stair training;Functional mobility training;Therapeutic activities;Therapeutic exercise;Balance training;Neuromuscular re-education;Patient/family education;Modalities    PT Goals (Current goals can be found in the Care Plan section)  Acute Rehab PT Goals Patient Stated Goal: to be able to get strong enough to have the R TKA done PT Goal Formulation: With patient Time For Goal Achievement: 11/22/23 Potential to Achieve Goals: Good    Frequency 7X/week     Co-evaluation               AM-PAC PT 6 Clicks Mobility  Outcome Measure Help needed turning from your back to your side while in a flat bed without using bedrails?: None Help needed moving from lying on your back to sitting on the side of a flat bed without using bedrails?: None Help needed moving to and from a bed to a chair (including a wheelchair)?: A Little Help  needed standing up from a chair using your arms (e.g., wheelchair or bedside chair)?: A Little Help needed to walk in hospital room?: A Little Help needed climbing 3-5 steps with a railing? : A Lot 6 Click Score: 19    End of Session Equipment Utilized During Treatment: Gait belt Activity Tolerance: Treatment limited secondary to medical complications (Comment) (signs and symptoms of orthostatic hypotension, not formally assessed) Patient left: in chair;with call bell/phone within reach Nurse Communication: Mobility status PT Visit Diagnosis: Unsteadiness on feet (R26.81);Muscle weakness (generalized) (M62.81);Difficulty in walking, not elsewhere classified (R26.2);Pain Pain - Right/Left: Left Pain - part of body: Knee;Leg    Time: 8198-8175 PT Time Calculation (min) (ACUTE ONLY): 23 min   Charges:   PT Evaluation $PT Eval Low Complexity: 1 Low PT Treatments $Therapeutic Activity: 8-22 mins PT General Charges $$ ACUTE PT VISIT: 1 Visit         [place], PT Acute Rehab   [place] VEAR Drone 11/08/2023, 7:10 PM

## 2023-11-08 NOTE — Plan of Care (Signed)
  Problem: Education: Goal: Knowledge of General Education information will improve Description: Including pain rating scale, medication(s)/side effects and non-pharmacologic comfort measures Outcome: Progressing   Problem: Activity: Goal: Risk for activity intolerance will decrease Outcome: Progressing   Problem: Nutrition: Goal: Adequate nutrition will be maintained Outcome: Progressing   Problem: Safety: Goal: Ability to remain free from injury will improve Outcome: Progressing   Problem: Pain Management: Goal: General experience of comfort will improve Outcome: Progressing

## 2023-11-08 NOTE — Anesthesia Procedure Notes (Signed)
 Spinal  Patient location during procedure: OR Start time: 11/08/2023 10:30 AM End time: 11/08/2023 10:35 AM Reason for block: surgical anesthesia Staffing Performed: anesthesiologist  Anesthesiologist: Peggye Delon Brunswick, MD Performed by: Peggye Delon Brunswick, MD Authorized by: Peggye Delon Brunswick, MD   Preanesthetic Checklist Completed: patient identified, IV checked, site marked, risks and benefits discussed, surgical consent, monitors and equipment checked, pre-op evaluation and timeout performed Spinal Block Patient position: sitting Prep: DuraPrep Patient monitoring: blood pressure and continuous pulse ox Approach: midline Location: L3-4 Injection technique: single-shot Needle Needle type: Pencan  Needle gauge: 24 G Needle length: 9 cm Additional Notes Risks and benefits of neuraxial anesthesia including, but not limited to, infection, bleeding, local anesthetic toxicity, headache, hypotension, back pain, block failure, etc. were discussed with the patient. The patient expressed understanding and consented to the procedure. I confirmed that the patient has no bleeding disorders and is not taking blood thinners. I confirmed the patient's last platelet count with the nurse. Monitors were applied. A time-out was performed immediately prior to the procedure. Sterile technique was used throughout the whole procedure.   1 attempt by CRNA. 2 attempts by MDA.

## 2023-11-08 NOTE — Care Plan (Signed)
 Ortho Bundle Case Management Note  Patient Details  Name: Judy Chapman MRN: [id number] Date of Birth: [birth year]                  L TKA on 11/08/23.  DCP: Home with husband.  DME: No needs. Borrowing RW and cane.  PT: EO 1/17   DME Arranged:  N/A DME Agency:       Additional Comments: Please contact me with any questions of if this plan should need to change.    Lyle Pepper, CCM Case Manager, [place]  [phone] 11/08/2023, 9:49 AM

## 2023-11-08 NOTE — Transfer of Care (Signed)
 Immediate Anesthesia Transfer of Care Note  Patient: Judy Chapman  Procedure(s) Performed: Procedure(s): TOTAL KNEE ARTHROPLASTY (Left)  Patient Location: PACU  Anesthesia Type:Spinal  Level of Consciousness: awake, alert  and oriented  Airway & Oxygen Therapy: Patient Spontanous Breathing  Post-op Assessment: Report given to RN and Post -op Vital signs reviewed and stable  Post vital signs: Reviewed and stable  Last Vitals:  Vitals:   11/08/23 0925 11/08/23 0930  BP: (!) 144/72 (!) 156/74  Pulse: 66 61  Resp: 18 18  Temp:    SpO2: 98% 99%    Complications: No apparent anesthesia complications

## 2023-11-08 NOTE — Progress Notes (Signed)
 Orthopedic Tech Progress Note Patient Details:  Judy Chapman November 10, 1945 [id number] CPM will be removed at 4:30pm.  CPM Left Knee CPM Left Knee: On Left Knee Flexion (Degrees): 40 Left Knee Extension (Degrees): 10  Post Interventions Patient Tolerated: Well  Judy Chapman 11/08/2023, 12:39 PM

## 2023-11-08 NOTE — Discharge Instructions (Signed)
° °Frank Aluisio, MD °Total Joint Specialist °[facility] °[address] °[place], [place] [address] °[phone] ° °TOTAL KNEE REPLACEMENT POSTOPERATIVE DIRECTIONS ° ° ° °Knee Rehabilitation, Guidelines Following Surgery  °Results after knee surgery are often greatly improved when you follow the exercise, range of motion and muscle strengthening exercises prescribed by your doctor. Safety measures are also important to protect the knee from further injury. If any of these exercises cause you to have increased pain or swelling in your knee joint, decrease the amount until you are comfortable again and slowly increase them. If you have problems or questions, call your caregiver or physical therapist for advice.  ° °BLOOD CLOT PREVENTION °Take a 10 mg Xarelto once a day for three weeks following surgery. Then take an 81 mg Aspirin once a day for three weeks. Then discontinue Aspirin. °You may resume your vitamins/supplements once you have discontinued the Xarelto. °Do not take any NSAIDs (Advil, Aleve, Ibuprofen, Meloxicam, etc.) until you have discontinued the Xarelto.  ° °HOME CARE INSTRUCTIONS  °Remove items at home which could result in a fall. This includes throw rugs or furniture in walking pathways.  °ICE to the affected knee as much as tolerated. Icing helps control swelling. If the swelling is well controlled you will be more comfortable and rehab easier. Continue to use ice on the knee for pain and swelling from surgery. You may notice swelling that will progress down to the foot and ankle. This is normal after surgery. Elevate the leg when you are not up walking on it.    °Continue to use the breathing machine which will help keep your temperature down. It is common for your temperature to cycle up and down following surgery, especially at night when you are not up moving around and exerting yourself. The breathing machine keeps your lungs expanded and your temperature  down. °Do not place pillow under the operative knee, focus on keeping the knee straight while resting ° °DIET °You may resume your previous home diet once you are discharged from the hospital. ° °DRESSING / WOUND CARE / SHOWERING °Keep your bulky bandage on for 2 days. On the third post-operative day you may remove the Ace bandage and gauze. There is a waterproof adhesive bandage on your skin which will stay in place until your first follow-up appointment. Once you remove this you will not need to place another bandage °You may begin showering 3 days following surgery, but do not submerge the incision under water. ° °ACTIVITY °For the first 5 days, the key is rest and control of pain and swelling °Do your home exercises twice a day starting on post-operative day 3. On the days you go to physical therapy, just do the home exercises once that day. °You should rest, ice and elevate the leg for 50 minutes out of every hour. Get up and walk/stretch for 10 minutes per hour. After 5 days you can increase your activity slowly as tolerated. °Walk with your walker as instructed. Use the walker until you are comfortable transitioning to a cane. Walk with the cane in the opposite hand of the operative leg. You may discontinue the cane once you are comfortable and walking steadily. °Avoid periods of inactivity such as sitting longer than an hour when not asleep. This helps prevent blood clots.  °You may discontinue the knee immobilizer once you are able to perform a straight leg raise while lying down. °You may resume a sexual relationship in one month or   when given the OK by your doctor.  °You may return to work once you are cleared by your doctor.  °Do not drive a car for 6 weeks or until released by your surgeon.  °Do not drive while taking narcotics. ° °TED HOSE STOCKINGS °Wear the elastic stockings on both legs for three weeks following surgery during the day. You may remove them at night for sleeping. ° °WEIGHT  BEARING °Weight bearing as tolerated with assist device (walker, cane, etc) as directed, use it as long as suggested by your surgeon or therapist, typically at least 4-6 weeks. ° °POSTOPERATIVE CONSTIPATION PROTOCOL °Constipation - defined medically as fewer than three stools per week and severe constipation as less than one stool per week. ° °One of the most common issues patients have following surgery is constipation.  Even if you have a regular bowel pattern at home, your normal regimen is likely to be disrupted due to multiple reasons following surgery.  Combination of anesthesia, postoperative narcotics, change in appetite and fluid intake all can affect your bowels.  In order to avoid complications following surgery, here are some recommendations in order to help you during your recovery period. ° °Colace (docusate) - Pick up an over-the-counter form of Colace or another stool softener and take twice a day as long as you are requiring postoperative pain medications.  Take with a full glass of water daily.  If you experience loose stools or diarrhea, hold the colace until you stool forms back up. If your symptoms do not get better within 1 week or if they get worse, check with your doctor. °Dulcolax (bisacodyl) - Pick up over-the-counter and take as directed by the product packaging as needed to assist with the movement of your bowels.  Take with a full glass of water.  Use this product as needed if not relieved by Colace only.  °MiraLax (polyethylene glycol) - Pick up over-the-counter to have on hand. MiraLax is a solution that will increase the amount of water in your bowels to assist with bowel movements.  Take as directed and can mix with a glass of water, juice, soda, coffee, or tea. Take if you go more than two days without a movement. Do not use MiraLax more than once per day. Call your doctor if you are still constipated or irregular after using this medication for 7 days in a row. ° °If you continue  to have problems with postoperative constipation, please contact the office for further assistance and recommendations.  If you experience "the worst abdominal pain ever" or develop nausea or vomiting, please contact the office immediatly for further recommendations for treatment. ° °ITCHING °If you experience itching with your medications, try taking only a single pain pill, or even half a pain pill at a time.  You can also use Benadryl over the counter for itching or also to help with sleep.  ° °MEDICATIONS °See your medication summary on the “After Visit Summary” that the nursing staff will review with you prior to discharge.  You may have some home medications which will be placed on hold until you complete the course of blood thinner medication.  It is important for you to complete the blood thinner medication as prescribed by your surgeon.  Continue your approved medications as instructed at time of discharge. ° °PRECAUTIONS °If you experience chest pain or shortness of breath - call 911 immediately for transfer to the hospital emergency department.  °If you develop a fever greater that 101 F, purulent   drainage from wound, increased redness or drainage from wound, foul odor from the wound/dressing, or calf pain - CONTACT YOUR SURGEON.   °                                                °FOLLOW-UP APPOINTMENTS °Make sure you keep all of your appointments after your operation with your surgeon and caregivers. You should call the office at the above phone number and make an appointment for approximately two weeks after the date of your surgery or on the date instructed by your surgeon outlined in the "After Visit Summary". ° °RANGE OF MOTION AND STRENGTHENING EXERCISES  °Rehabilitation of the knee is important following a knee injury or an operation. After just a few days of immobilization, the muscles of the thigh which control the knee become weakened and shrink (atrophy). Knee exercises are designed to build up  the tone and strength of the thigh muscles and to improve knee motion. Often times heat used for twenty to thirty minutes before working out will loosen up your tissues and help with improving the range of motion but do not use heat for the first two weeks following surgery. These exercises can be done on a training (exercise) mat, on the floor, on a table or on a bed. Use what ever works the best and is most comfortable for you Knee exercises include:  °Leg Lifts - While your knee is still immobilized in a splint or cast, you can do straight leg raises. Lift the leg to 60 degrees, hold for 3 sec, and slowly lower the leg. Repeat 10-20 times 2-3 times daily. Perform this exercise against resistance later as your knee gets better.  °Quad and Hamstring Sets - Tighten up the muscle on the front of the thigh (Quad) and hold for 5-10 sec. Repeat this 10-20 times hourly. Hamstring sets are done by pushing the foot backward against an object and holding for 5-10 sec. Repeat as with quad sets.  °Leg Slides: Lying on your back, slowly slide your foot toward your buttocks, bending your knee up off the floor (only go as far as is comfortable). Then slowly slide your foot back down until your leg is flat on the floor again. °Angel Wings: Lying on your back spread your legs to the side as far apart as you can without causing discomfort.  °A rehabilitation program following serious knee injuries can speed recovery and prevent re-injury in the future due to weakened muscles. Contact your doctor or a physical therapist for more information on knee rehabilitation.  ° °POST-OPERATIVE OPIOID TAPER INSTRUCTIONS: °It is important to wean off of your opioid medication as soon as possible. If you do not need pain medication after your surgery it is ok to stop day one. °Opioids include: °Codeine, Hydrocodone(Norco, Vicodin), Oxycodone(Percocet, oxycontin) and hydromorphone amongst others.  °Long term and even short term use of opiods can  cause: °Increased pain response °Dependence °Constipation °Depression °Respiratory depression °And more.  °Withdrawal symptoms can include °Flu like symptoms °Nausea, vomiting °And more °Techniques to manage these symptoms °Hydrate well °Eat regular healthy meals °Stay active °Use relaxation techniques(deep breathing, meditating, yoga) °Do Not substitute Alcohol to help with tapering °If you have been on opioids for less than two weeks and do not have pain than it is ok to stop all together.  °Plan to   wean off of opioids °This plan should start within one week post op of your joint replacement. °Maintain the same interval or time between taking each dose and first decrease the dose.  °Cut the total daily intake of opioids by one tablet each day °Next start to increase the time between doses. °The last dose that should be eliminated is the evening dose.  ° °IF YOU ARE TRANSFERRED TO A SKILLED REHAB FACILITY °If the patient is transferred to a skilled rehab facility following release from the hospital, a list of the current medications will be sent to the facility for the patient to continue.  When discharged from the skilled rehab facility, please have the facility set up the patient's Home Health Physical Therapy prior to being released. Also, the skilled facility will be responsible for providing the patient with their medications at time of release from the facility to include their pain medication, the muscle relaxants, and their blood thinner medication. If the patient is still at the rehab facility at time of the two week follow up appointment, the skilled rehab facility will also need to assist the patient in arranging follow up appointment in our office and any transportation needs. ° °MAKE SURE YOU:  °Understand these instructions.  °Get help right away if you are not doing well or get worse.  ° °DENTAL ANTIBIOTICS: ° °In most cases prophylactic antibiotics for Dental procdeures after total joint surgery are  not necessary. ° °Exceptions are as follows: ° °1. History of prior total joint infection ° °2. Severely immunocompromised (Organ Transplant, cancer chemotherapy, Rheumatoid biologic °meds such as Humera) ° °3. Poorly controlled diabetes (A1C &gt; 8.0, blood glucose over 200) ° °If you have one of these conditions, contact your surgeon for an antibiotic prescription, prior to your °dental procedure.  ° ° °Pick up stool softner and laxative for home use following surgery while on pain medications. °Do not submerge incision under water. °Please use good hand washing techniques while changing dressing each day. °May shower starting three days after surgery. °Please use a clean towel to pat the incision dry following showers. °Continue to use ice for pain and swelling after surgery. °Do not use any lotions or creams on the incision until instructed by your surgeon. ° °

## 2023-11-08 NOTE — Op Note (Signed)
 OPERATIVE REPORT-TOTAL KNEE ARTHROPLASTY   Pre-operative diagnosis- Osteoarthritis  Left knee(s)  Post-operative diagnosis- Osteoarthritis Left knee(s)  Procedure-  Left  Total Knee Arthroplasty  Surgeon- Dempsey GAILS. Marcelino Campos, MD  Assistant- Zelda Kobs, PA-C   Anesthesia-   Adductor canal block and spinal  EBL- 25 ml   Drains None  Tourniquet time-  Total Tourniquet Time Documented: Thigh (Left) - 32 minutes Total: Thigh (Left) - 32 minutes     Complications- None  Condition-PACU - hemodynamically stable.   Brief Clinical Note  Judy Chapman is a 78 y.o. year old female with end stage OA of her left knee with progressively worsening pain and dysfunction. She has constant pain, with activity and at rest and significant functional deficits with difficulties even with ADLs. She has had extensive non-op management including analgesics, injections of cortisone and viscosupplements, and home exercise program, but remains in significant pain with significant dysfunction. Radiographs show bone on bone arthritis medial and patellofemoral. She presents now for left Total Knee Arthroplasty.     Procedure in detail---   The patient is brought into the operating room and positioned supine on the operating table. After successful administration of  Adductor canal block and spinal,   a tourniquet is placed high on the  Left thigh(s) and the lower extremity is prepped and draped in the usual sterile fashion. Time out is performed by the operating team and then the  Left lower extremity is wrapped in Esmarch, knee flexed and the tourniquet inflated to 300 mmHg.       A midline incision is made with a ten blade through the subcutaneous tissue to the level of the extensor mechanism. A fresh blade is used to make a medial parapatellar arthrotomy. Soft tissue over the proximal medial tibia is subperiosteally elevated to the joint line with a knife and into the semimembranosus bursa with a  Cobb elevator. Soft tissue over the proximal lateral tibia is elevated with attention being paid to avoiding the patellar tendon on the tibial tubercle. The patella is everted, knee flexed 90 degrees and the ACL and PCL are removed. Findings are bone on bone medial and patellofemoral with large global osteophytes        The drill is used to create a starting hole in the distal femur and the canal is thoroughly irrigated with sterile saline to remove the fatty contents. The 5 degree Left  valgus alignment guide is placed into the femoral canal and the distal femoral cutting block is pinned to remove 9 mm off the distal femur. Resection is made with an oscillating saw.      The tibia is subluxed forward and the menisci are removed. The extramedullary alignment guide is placed referencing proximally at the medial aspect of the tibial tubercle and distally along the second metatarsal axis and tibial crest. The block is pinned to remove 2mm off the more deficient medial  side. Resection is made with an oscillating saw. Size 5is the most appropriate size for the tibia and the proximal tibia is prepared with the modular drill and keel punch for that size.      The femoral sizing guide is placed and size 5 is most appropriate. Rotation is marked off the epicondylar axis and confirmed by creating a rectangular flexion gap at 90 degrees. The size 5 cutting block is pinned in this rotation and the anterior, posterior and chamfer cuts are made with the oscillating saw. The intercondylar block is then placed and that cut  is made.      Trial size 5 tibial component, trial size 5 posterior stabilized femur and a 10  mm posterior stabilized rotating platform insert trial is placed. Full extension is achieved with excellent varus/valgus and anterior/posterior balance throughout full range of motion. The patella is everted and thickness measured to be 22  mm. Free hand resection is taken to 12 mm, a 35 template is placed, lug  holes are drilled, trial patella is placed, and it tracks normally. Osteophytes are removed off the posterior femur with the trial in place. All trials are removed and the cut bone surfaces prepared with pulsatile lavage. Cement is mixed and once ready for implantation, the size 5 tibial implant, size  5 posterior stabilized femoral component, and the size 35 patella are cemented in place and the patella is held with the clamp. The trial insert is placed and the knee held in full extension. The Exparel  (20 ml mixed with 60 ml saline) is injected into the extensor mechanism, posterior capsule, medial and lateral gutters and subcutaneous tissues.  All extruded cement is removed and once the cement is hard the permanent 10 mm posterior stabilized rotating platform insert is placed into the tibial tray.      The wound is copiously irrigated with saline solution and the extensor mechanism closed with # 0 Stratofix suture. The tourniquet is released for a total tourniquet time of 32  minutes. Flexion against gravity is 140 degrees and the patella tracks normally. Subcutaneous tissue is closed with 2.0 vicryl and subcuticular with running 4.0 Monocryl. The incision is cleaned and dried and steri-strips and a bulky sterile dressing are applied. The limb is placed into a knee immobilizer and the patient is awakened and transported to recovery in stable condition.      Please note that a surgical assistant was a medical necessity for this procedure in order to perform it in a safe and expeditious manner. Surgical assistant was necessary to retract the ligaments and vital neurovascular structures to prevent injury to them and also necessary for proper positioning of the limb to allow for anatomic placement of the prosthesis.   Dempsey ROCKFORD [name], MD    11/08/2023, 11:40 AM

## 2023-11-09 ENCOUNTER — Other Ambulatory Visit (HOSPITAL_COMMUNITY): Payer: Self-pay

## 2023-11-09 ENCOUNTER — Encounter (HOSPITAL_COMMUNITY): Payer: Self-pay | Admitting: Orthopedic Surgery

## 2023-11-09 DIAGNOSIS — J45909 Unspecified asthma, uncomplicated: Secondary | ICD-10-CM | POA: Diagnosis not present

## 2023-11-09 DIAGNOSIS — Z79899 Other long term (current) drug therapy: Secondary | ICD-10-CM | POA: Diagnosis not present

## 2023-11-09 DIAGNOSIS — M1712 Unilateral primary osteoarthritis, left knee: Secondary | ICD-10-CM | POA: Diagnosis not present

## 2023-11-09 DIAGNOSIS — I119 Hypertensive heart disease without heart failure: Secondary | ICD-10-CM | POA: Diagnosis not present

## 2023-11-09 DIAGNOSIS — Z87891 Personal history of nicotine dependence: Secondary | ICD-10-CM | POA: Diagnosis not present

## 2023-11-09 DIAGNOSIS — Z853 Personal history of malignant neoplasm of breast: Secondary | ICD-10-CM | POA: Diagnosis not present

## 2023-11-09 DIAGNOSIS — Z950 Presence of cardiac pacemaker: Secondary | ICD-10-CM | POA: Diagnosis not present

## 2023-11-09 LAB — CBC
HCT: 31.5 % — ABNORMAL LOW (ref 36.0–46.0)
Hemoglobin: 10.3 g/dL — ABNORMAL LOW (ref 12.0–15.0)
MCH: 33.4 pg (ref 26.0–34.0)
MCHC: 32.7 g/dL (ref 30.0–36.0)
MCV: 102.3 fL — ABNORMAL HIGH (ref 80.0–100.0)
Platelets: 144 10*3/uL — ABNORMAL LOW (ref 150–400)
RBC: 3.08 MIL/uL — ABNORMAL LOW (ref 3.87–5.11)
RDW: 13.1 % (ref 11.5–15.5)
WBC: 11.3 10*3/uL — ABNORMAL HIGH (ref 4.0–10.5)
nRBC: 0 % (ref 0.0–0.2)

## 2023-11-09 LAB — BASIC METABOLIC PANEL
Anion gap: 9 (ref 5–15)
BUN: 18 mg/dL (ref 8–23)
CO2: 22 mmol/L (ref 22–32)
Calcium: 7.6 mg/dL — ABNORMAL LOW (ref 8.9–10.3)
Chloride: 103 mmol/L (ref 98–111)
Creatinine, Ser: 0.72 mg/dL (ref 0.44–1.00)
GFR, Estimated: >60 mL/min (ref >60)
Glucose, Bld: 118 mg/dL — ABNORMAL HIGH (ref 70–99)
Potassium: 3.7 mmol/L (ref 3.5–5.1)
Sodium: 134 mmol/L — ABNORMAL LOW (ref 135–145)

## 2023-11-09 MED ORDER — METHOCARBAMOL 500 MG PO TABS
500.0000 mg | ORAL_TABLET | Freq: Four times a day (QID) | ORAL | 0 refills | Status: DC | PRN
  Filled 2023-11-09: qty 40, 10d supply, fill #0

## 2023-11-09 MED ORDER — ONDANSETRON HCL 4 MG PO TABS
4.0000 mg | ORAL_TABLET | Freq: Four times a day (QID) | ORAL | 0 refills | Status: DC | PRN
  Filled 2023-11-09: qty 20, 5d supply, fill #0

## 2023-11-09 MED ORDER — ASPIRIN 81 MG PO CHEW
81.0000 mg | CHEWABLE_TABLET | Freq: Two times a day (BID) | ORAL | 0 refills | Status: AC
  Filled 2023-11-09: qty 40, 20d supply, fill #0

## 2023-11-09 MED ORDER — OXYCODONE HCL 5 MG PO TABS
5.0000 mg | ORAL_TABLET | ORAL | 0 refills | Status: DC | PRN
  Filled 2023-11-09: qty 42, 4d supply, fill #0

## 2023-11-09 NOTE — Progress Notes (Addendum)
 Subjective: 1 Day Post-Op Procedure(s) (LRB): TOTAL KNEE ARTHROPLASTY (Left) Patient seen in rounds by Dr. Melodi. Patient is  well. States she had a rough night and was not able to sleep much . Denies SOB or chest pain. Denies calf pain. Foley cath removed this AM. Patient reports pain as moderate. Worked with physical therapy yesterday and ambulated 4'. We will continue physical therapy today.  Objective: Vital signs in last 24 hours: Temp:  [97.7 F (36.5 C)-98.6 F (37 C)] 98.6 F (37 C) (01/14 0508) Pulse Rate:  [56-77] 77 (01/14 0508) Resp:  [16-20] 16 (01/14 0508) BP: (98-156)/(52-92) 149/92 (01/14 0508) SpO2:  [90 %-99 %] 95 % (01/14 0508) Weight:  [79.8 kg] 79.8 kg (01/13 1358)  Intake/Output from previous day:  Intake/Output Summary (Last 24 hours) at 11/09/2023 0742 Last data filed at 11/09/2023 0629 Gross per 24 hour  Intake 2696.47 ml  Output 1845 ml  Net 851.47 ml     Intake/Output this shift: No intake/output data recorded.  Labs: Recent Labs    11/09/23 0344  HGB 10.3*   Recent Labs    11/09/23 0344  WBC 11.3*  RBC 3.08*  HCT 31.5*  PLT 144*   Recent Labs    11/09/23 0344  NA 134*  K 3.7  CL 103  CO2 22  BUN 18  CREATININE 0.72  GLUCOSE 118*  CALCIUM 7.6*   No results for input(s): LABPT, INR in the last 72 hours.  Exam: General - Patient is Alert and Oriented Extremity - Neurologically intact Neurovascular intact Sensation intact distally Dorsiflexion/Plantar flexion intact Dressing - dressing C/D/I Motor Function - intact, moving foot and toes well on exam.  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   Weight as of this encounter: 79.8 kg. Advance diet Up with therapy D/C IV fluids  Patient's anticipated LOS is less than 2 midnights, meeting these requirements: - Lives within 1 hour of care - Has a competent adult at home to recover with post-op recover - NO history of  - Chronic pain requiring opiods  - Diabetes  - Coronary Artery Disease  - Heart failure  - Heart attack  - Stroke  - DVT/VTE  - Cardiac arrhythmia  - Respiratory Failure/COPD  - Renal failure  - Anemia  - Advanced Liver disease  DVT Prophylaxis - Aspirin  Weight bearing as tolerated.  Continue physical therapy. Hopeful to discharge home today pending progress and if meeting patient goals. Scheduled for OPPT at [facility]. Follow-up in clinic in 2 weeks.  The PDMP database was reviewed today prior to any opioid medications being prescribed to this patient.  R. Zelda Kobs, PA-C Orthopedic Surgery [phone] 11/09/2023, 7:42 AM

## 2023-11-09 NOTE — Plan of Care (Signed)
  Problem: Education: Goal: Knowledge of General Education information will improve Description: Including pain rating scale, medication(s)/side effects and non-pharmacologic comfort measures Outcome: Progressing   Problem: Activity: Goal: Risk for activity intolerance will decrease Outcome: Progressing   Problem: Pain Management: Goal: General experience of comfort will improve Outcome: Progressing

## 2023-11-09 NOTE — Progress Notes (Signed)
 TOC meds in secure bag delivered to pt in room

## 2023-11-09 NOTE — Progress Notes (Signed)
 Physical Therapy Treatment Patient Details Name: Judy Chapman MRN: [id number] DOB: July 01, 1946 Today's Date: 11/09/2023   History of Present Illness 78 yo female presents to therapy s/p L TKA on 11/08/2023 due to failure of conservative measures. Pt PMH RA positive factor, ILD, R Ba Ca, sinoatrial node dysfunction, pacemaker, cough, endometriosis, OP, HTN, and gout.    PT Comments  POD # 1 pm session Assisted with amb a greater distance in hallway.  General stair comments: VC's on proper tech using walker up forward ONE step NO rails.  Practiced twice. Then returned to room to perform some TE's following HEP handout.  Instructed on proper tech, freq as well as use of ICE.   Addressed all mobility questions, discussed appropriate activity, educated on use of ICE.  Pt ready for D/C to home.    If plan is discharge home, recommend the following: A little help with walking and/or transfers;A little help with bathing/dressing/bathroom;Assistance with cooking/housework;Assist for transportation;Help with stairs or ramp for entrance   Can travel by private vehicle        Equipment Recommendations  None recommended by PT    Recommendations for Other Services       Precautions / Restrictions Precautions Precautions: Fall;Knee Precaution Comments: no pillow under knee Restrictions Weight Bearing Restrictions Per Provider Order: No Other Position/Activity Restrictions: WBAT     Mobility  Bed Mobility               General bed mobility comments: OOB in recliner    Transfers Overall transfer level: Needs assistance Equipment used: Rolling walker (2 wheels) Transfers: Sit to/from Stand Sit to Stand: Contact guard assist, Supervision           General transfer comment: VC's on proper hand placement and increased time with VC's on safety with turns    Ambulation/Gait Ambulation/Gait assistance: Supervision, Contact guard assist Gait Distance (Feet): 35  Feet Assistive device: Rolling walker (2 wheels) Gait Pattern/deviations: Step-to pattern, Antalgic Gait velocity: decreased     General Gait Details: tolerated an increased diatnce with VC's on proper sequencing and proper walker to self distance.   Stairs Stairs: Yes Stairs assistance: Supervision, Contact guard assist Stair Management: No rails, Step to pattern, Forwards, With walker Number of Stairs: 1 General stair comments: VC's on proper tech using walker up forward ONE step NO rails.  Practiced twice.   Wheelchair Mobility     Tilt Bed    Modified Rankin (Stroke Patients Only)       Balance                                            Cognition                                                Exercises  Total Knee Replacement TE's following HEP handout 10 reps B LE ankle pumps 05 reps towel squeezes 05 reps knee presses 05 reps heel slides  05 reps SAQ's 05 reps SLR's 05 reps ABD Educated on use of gait belt to assist with TE's Followed by ICE     General Comments        Pertinent Vitals/Pain Pain Assessment Pain Assessment: Faces Pain Score: 5  Pain Location: L  Knee and LE Pain Descriptors / Indicators: Aching, Constant, Operative site guarding Pain Intervention(s): Monitored during session, Premedicated before session, Repositioned, Ice applied    Home Living                          Prior Function            PT Goals (current goals can now be found in the care plan section) Progress towards PT goals: Progressing toward goals    Frequency    7X/week      PT Plan      Co-evaluation              AM-PAC PT 6 Clicks Mobility   Outcome Measure  Help needed turning from your back to your side while in a flat bed without using bedrails?: A Little Help needed moving from lying on your back to sitting on the side of a flat bed without using bedrails?: A Little Help needed moving  to and from a bed to a chair (including a wheelchair)?: A Little Help needed standing up from a chair using your arms (e.g., wheelchair or bedside chair)?: A Little Help needed to walk in hospital room?: A Little Help needed climbing 3-5 steps with a railing? : A Little 6 Click Score: 18    End of Session Equipment Utilized During Treatment: Gait belt   Patient left: in chair;with call bell/phone within reach Nurse Communication: Mobility status PT Visit Diagnosis: Unsteadiness on feet (R26.81);Muscle weakness (generalized) (M62.81);Difficulty in walking, not elsewhere classified (R26.2);Pain Pain - Right/Left: Left Pain - part of body: Knee;Leg     Time: 8655-8587 PT Time Calculation (min) (ACUTE ONLY): 28 min  Charges:    $Gait Training: 8-22 mins $Therapeutic Exercise: 8-22 mins PT General Charges $$ ACUTE PT VISIT: 1 Visit                     Katheryn Leap  PTA Acute  Rehabilitation Services Office M-F          [phone]

## 2023-11-09 NOTE — Progress Notes (Signed)
 Patient desats to the mid 80's on room air.

## 2023-11-09 NOTE — Progress Notes (Signed)
 Physical Therapy Treatment Patient Details Name: Judy Chapman MRN: [id number] DOB: 1946/06/25 Today's Date: 11/09/2023   History of Present Illness 78 yo female presents to therapy s/p L TKA on 11/08/2023 due to failure of conservative measures. Pt PMH RA positive factor, ILD, R Ba Ca, sinoatrial node dysfunction, pacemaker, cough, endometriosis, OP, HTN, and gout.    PT Comments  POD # 1 am session Pt AxO x 3 very pleasant and motivated.  Pt was OOB in recliner.  Assisted with apply her personal shoes.  Assisted with amb in hallway.  General Gait Details: tolerated an increased diatnce with VC's on proper sequencing and proper walker to self distance. Assisted to recliner.  General transfer comment: VC's on proper hand placement and increased time with VC's on safety with turns.  Pain is tolerable 5/10. Then returned to room to perform some TE's following HEP handout.  Instructed on proper tech, freq as well as use of ICE.   Pt will need another PT session to practice stairs and complete HEP Education.     If plan is discharge home, recommend the following: A little help with walking and/or transfers;A little help with bathing/dressing/bathroom;Assistance with cooking/housework;Assist for transportation;Help with stairs or ramp for entrance   Can travel by private vehicle        Equipment Recommendations  None recommended by PT    Recommendations for Other Services       Precautions / Restrictions Precautions Precautions: Fall;Knee Precaution Comments: no pillow under knee Restrictions Weight Bearing Restrictions Per Provider Order: No Other Position/Activity Restrictions: WBAT     Mobility  Bed Mobility               General bed mobility comments: OOB in recliner    Transfers Overall transfer level: Needs assistance Equipment used: Rolling walker (2 wheels) Transfers: Sit to/from Stand Sit to Stand: Contact guard assist, Supervision           General  transfer comment: VC's on proper hand placement and increased time with VC's on safety with turns    Ambulation/Gait Ambulation/Gait assistance: Supervision, Contact guard assist Gait Distance (Feet): 24 Feet Assistive device: Rolling walker (2 wheels) Gait Pattern/deviations: Step-to pattern, Antalgic Gait velocity: decreased     General Gait Details: tolerated an increased diatnce with VC's on proper sequencing and proper walker to self distance.   Stairs             Wheelchair Mobility     Tilt Bed    Modified Rankin (Stroke Patients Only)       Balance                                            Cognition                                                Exercises  Total Knee Replacement TE's following HEP handout 10 reps B LE ankle pumps 05 reps towel squeezes 05 reps knee presses 05 reps heel slides  05 reps SAQ's 05 reps SLR's 05 reps ABD Educated on use of gait belt to assist with TE's Followed by ICE     General Comments        Pertinent Vitals/Pain Pain Assessment  Pain Assessment: Faces Pain Score: 5  Pain Location: L Knee and LE Pain Descriptors / Indicators: Aching, Constant, Operative site guarding Pain Intervention(s): Monitored during session, Premedicated before session, Repositioned, Ice applied    Home Living                          Prior Function            PT Goals (current goals can now be found in the care plan section) Progress towards PT goals: Progressing toward goals    Frequency    7X/week      PT Plan      Co-evaluation              AM-PAC PT 6 Clicks Mobility   Outcome Measure  Help needed turning from your back to your side while in a flat bed without using bedrails?: A Little Help needed moving from lying on your back to sitting on the side of a flat bed without using bedrails?: A Little Help needed moving to and from a bed to a chair  (including a wheelchair)?: A Little Help needed standing up from a chair using your arms (e.g., wheelchair or bedside chair)?: A Little Help needed to walk in hospital room?: A Little Help needed climbing 3-5 steps with a railing? : A Little 6 Click Score: 18    End of Session Equipment Utilized During Treatment: Gait belt   Patient left: in chair;with call bell/phone within reach Nurse Communication: Mobility status PT Visit Diagnosis: Unsteadiness on feet (R26.81);Muscle weakness (generalized) (M62.81);Difficulty in walking, not elsewhere classified (R26.2);Pain Pain - Right/Left: Left Pain - part of body: Knee;Leg     Time: 9069-9043 PT Time Calculation (min) (ACUTE ONLY): 26 min  Charges:    $Gait Training: 8-22 mins $Therapeutic Exercise: 8-22 mins PT General Charges $$ ACUTE PT VISIT: 1 Visit                     Katheryn Leap  PTA Acute  Rehabilitation Services Office M-F          [phone]

## 2023-11-09 NOTE — Care Management Obs Status (Signed)
 MEDICARE OBSERVATION STATUS NOTIFICATION   Patient Details  Name: Judy Chapman MRN: [id number] Date of Birth: 05/19/1946   Medicare Observation Status Notification Given:       Howell Rucks, RN 11/09/2023, 9:52 AM

## 2023-11-09 NOTE — Discharge Summary (Signed)
 Physician Discharge Summary   Patient ID: Judy Chapman MRN: [id number] DOB/AGE: 06/21/1946 78 y.o.  Admit date: 11/08/2023 Discharge date: 11/09/2023  Primary Diagnosis: Osteoarthritis left knee    Admission Diagnoses:  Past Medical History:  Diagnosis Date   ACE inhibitor intolerance    Arthritis    Asthma    outgrew as a child   Breast cancer (HCC)    Cancer (HCC)    right breast   Dysrhythmia    SSS   Early cataract    Endometriosis    GERD (gastroesophageal reflux disease)    Gout    Heart disease    Hypertension    Interstitial lung disease (HCC)    Osteopenia    Pacemaker-MDT    DOI-2002, Generator change 2010   Pneumonia    PVC (premature ventricular contraction)    Sinus node dysfunction (HCC)    Wears glasses    Discharge Diagnoses:   Principal Problem:   OA (osteoarthritis) of knee Active Problems:   Osteoarthritis of left knee  Estimated body mass index is 30.21 kg/m as calculated from the following:   Height as of this encounter: 5' 4 (1.626 m).   Weight as of this encounter: 79.8 kg.  Procedure:  Procedure(s) (LRB): TOTAL KNEE ARTHROPLASTY (Left)   Consults: None  HPI: Judy Chapman is a 78 y.o. year old female with end stage OA of her left knee with progressively worsening pain and dysfunction. She has constant pain, with activity and at rest and significant functional deficits with difficulties even with ADLs. She has had extensive non-op management including analgesics, injections of cortisone and viscosupplements, and home exercise program, but remains in significant pain with significant dysfunction. Radiographs show bone on bone arthritis medial and patellofemoral. She presents now for left Total Knee Arthroplasty.   Laboratory Data: Admission on 11/08/2023, Discharged on 11/09/2023  Component Date Value Ref Range Status   WBC 11/09/2023 11.3 (H)  4.0 - 10.5 K/uL Final   RBC 11/09/2023 3.08 (L)  3.87 - 5.11 MIL/uL Final    Hemoglobin 11/09/2023 10.3 (L)  12.0 - 15.0 g/dL Final   HCT 98/85/7974 31.5 (L)  36.0 - 46.0 % Final   MCV 11/09/2023 102.3 (H)  80.0 - 100.0 fL Final   MCH 11/09/2023 33.4  26.0 - 34.0 pg Final   MCHC 11/09/2023 32.7  30.0 - 36.0 g/dL Final   RDW 98/85/7974 13.1  11.5 - 15.5 % Final   Platelets 11/09/2023 144 (L)  150 - 400 K/uL Final   nRBC 11/09/2023 0.0  0.0 - 0.2 % Final   Performed at [facility], [address], [place], [place] [address]   Sodium 11/09/2023 134 (L)  135 - 145 mmol/L Final   Potassium 11/09/2023 3.7  3.5 - 5.1 mmol/L Final   Chloride 11/09/2023 103  98 - 111 mmol/L Final   CO2 11/09/2023 22  22 - 32 mmol/L Final   Glucose, Bld 11/09/2023 118 (H)  70 - 99 mg/dL Final   Glucose reference range applies only to samples taken after fasting for at least 8 hours.   BUN 11/09/2023 18  8 - 23 mg/dL Final   Creatinine, Ser 11/09/2023 0.72  0.44 - 1.00 mg/dL Final   Calcium 98/85/7974 7.6 (L)  8.9 - 10.3 mg/dL Final   GFR, Estimated 11/09/2023 >60  >60 mL/min Final   Comment: (NOTE) Calculated using the CKD-EPI Creatinine Equation (2021)    Anion gap 11/09/2023 9  5 -  15 Final   Performed at [facility], [address], [place], [place] [address]  Hospital Outpatient Visit on 11/03/2023  Component Date Value Ref Range Status   MRSA, PCR 11/03/2023 NEGATIVE  NEGATIVE Final   Staphylococcus aureus 11/03/2023 NEGATIVE  NEGATIVE Final   Comment: (NOTE) The Xpert SA Assay (FDA approved for NASAL specimens in patients 87 years of age and older), is one component of a comprehensive surveillance program. It is not intended to diagnose infection nor to guide or monitor treatment. Performed at [facility], [address] [place], [place] [address]    Sodium 11/03/2023 142  135 - 145 mmol/L Final   Potassium 11/03/2023 3.8  3.5 - 5.1 mmol/L Final   Chloride 11/03/2023 109  98 - 111 mmol/L Final   CO2 11/03/2023 26   22 - 32 mmol/L Final   Glucose, Bld 11/03/2023 95  70 - 99 mg/dL Final   Glucose reference range applies only to samples taken after fasting for at least 8 hours.   BUN 11/03/2023 25 (H)  8 - 23 mg/dL Final   Creatinine, Ser 11/03/2023 0.91  0.44 - 1.00 mg/dL Final   Calcium 98/91/7974 8.5 (L)  8.9 - 10.3 mg/dL Final   Total Protein 98/91/7974 6.5  6.5 - 8.1 g/dL Final   Albumin 98/91/7974 3.7  3.5 - 5.0 g/dL Final   AST 98/91/7974 27  15 - 41 U/L Final   ALT 11/03/2023 21  0 - 44 U/L Final   Alkaline Phosphatase 11/03/2023 39  38 - 126 U/L Final   Total Bilirubin 11/03/2023 0.7  0.0 - 1.2 mg/dL Final   GFR, Estimated 11/03/2023 >60  >60 mL/min Final   Comment: (NOTE) Calculated using the CKD-EPI Creatinine Equation (2021)    Anion gap 11/03/2023 7  5 - 15 Final   Performed at [facility], [address], [place], [place] [address]   WBC 11/03/2023 4.9  4.0 - 10.5 K/uL Final   RBC 11/03/2023 3.84 (L)  3.87 - 5.11 MIL/uL Final   Hemoglobin 11/03/2023 12.9  12.0 - 15.0 g/dL Final   HCT 98/91/7974 40.5  36.0 - 46.0 % Final   MCV 11/03/2023 105.5 (H)  80.0 - 100.0 fL Final   MCH 11/03/2023 33.6  26.0 - 34.0 pg Final   MCHC 11/03/2023 31.9  30.0 - 36.0 g/dL Final   RDW 98/91/7974 13.3  11.5 - 15.5 % Final   Platelets 11/03/2023 195  150 - 400 K/uL Final   nRBC 11/03/2023 0.0  0.0 - 0.2 % Final   Performed at [facility], [address] [place], [place] [address]     X-Rays:No results found.  EKG: Orders placed or performed in visit on 06/30/23   EKG 12-Lead     Hospital Course: Judy Chapman is a 78 y.o. who was admitted to [facility]. They were brought to the operating room on 11/08/2023 and underwent Procedure(s): TOTAL KNEE ARTHROPLASTY.  Patient tolerated the procedure well and was later transferred to the recovery room and then to the orthopaedic floor for postoperative care. They were given PO and IV analgesics for pain  control following their surgery. They were given 80 hours of postoperative antibiotics of  Anti-infectives (From admission, onward)    Start     Dose/Rate Route Frequency Ordered Stop   11/08/23 1600  ceFAZolin  (ANCEF ) IVPB 2g/100 mL premix        2 g 200 mL/hr  over 30 Minutes Intravenous Every 6 hours 11/08/23 1333 11/08/23 2140   11/08/23 0830  ceFAZolin  (ANCEF ) IVPB 2g/100 mL premix  Status:  Discontinued        2 g 200 mL/hr over 30 Minutes Intravenous On call to O.R. 11/08/23 9170 11/08/23 1327      and started on DVT prophylaxis in the form of Aspirin .   PT and OT were ordered for total joint protocol. Discharge planning consulted to help with postop disposition and equipment needs.  Patient had a good night on the evening of surgery. They started to get up OOB with therapy on POD #0. Pt was seen during rounds and was ready to go home pending progress with therapy. She worked with therapy on POD #1 and was meeting her goals. Pt was discharged to home later that day in stable condition.  Diet: Regular diet Activity: WBAT Follow-up: in 2 weeks Disposition: Home Discharged Condition: stable   Discharge Instructions     Call MD / Call 911   Complete by: As directed    If you experience chest pain or shortness of breath, CALL 911 and be transported to the hospital emergency room.  If you develope a fever above 101 F, pus (white drainage) or increased drainage or redness at the wound, or calf pain, call your surgeon's office.   Change dressing   Complete by: As directed    You may remove the bulky bandage (ACE wrap and gauze) two days after surgery. You will have an adhesive waterproof bandage underneath. Leave this in place until your first follow-up appointment.   Constipation Prevention   Complete by: As directed    Drink plenty of fluids.  Prune juice may be helpful.  You may use a stool softener, such as Colace (over the counter) 100 mg twice a day.  Use MiraLax  (over the counter)  for constipation as needed.   Diet - low sodium heart healthy   Complete by: As directed    Do not put a pillow under the knee. Place it under the heel.   Complete by: As directed    Driving restrictions   Complete by: As directed    No driving for two weeks   Post-operative opioid taper instructions:   Complete by: As directed    POST-OPERATIVE OPIOID TAPER INSTRUCTIONS: It is important to wean off of your opioid medication as soon as possible. If you do not need pain medication after your surgery it is ok to stop day one. Opioids include: Codeine, Hydrocodone (Norco, Vicodin), Oxycodone (Percocet, oxycontin ) and hydromorphone amongst others.  Long term and even short term use of opiods can cause: Increased pain response Dependence Constipation Depression Respiratory depression And more.  Withdrawal symptoms can include Flu like symptoms Nausea, vomiting And more Techniques to manage these symptoms Hydrate well Eat regular healthy meals Stay active Use relaxation techniques(deep breathing, meditating, yoga) Do Not substitute Alcohol  to help with tapering If you have been on opioids for less than two weeks and do not have pain than it is ok to stop all together.  Plan to wean off of opioids This plan should start within one week post op of your joint replacement. Maintain the same interval or time between taking each dose and first decrease the dose.  Cut the total daily intake of opioids by one tablet each day Next start to increase the time between doses. The last dose that should be eliminated is the evening dose.      TED hose  Complete by: As directed    Use stockings (TED hose) for three weeks on both leg(s).  You may remove them at night for sleeping.   Weight bearing as tolerated   Complete by: As directed       Allergies as of 11/09/2023       Reactions   Augmentin [amoxicillin-pot Clavulanate] Diarrhea   Anastrozole     Other reaction(s): couldn't sleep,  Other (See Comments)   Febuxostat Other (See Comments)   Other reaction(s): made her itch. urine smelled bad. felt better off of it. but could go back on it if needed she says   Nitrofurantoin Other (See Comments)   Sweating and chills Other reaction(s): achey, clammy. can take it but feels bad on it. Other reaction(s): Other (See Comments) Sweating and chills Sweating and chills   Pravastatin    Other reaction(s): felt miserable on it.felt hot and sweaty in the night.   Ramipril  Hives, Swelling   Other reaction(s): dr. Frutoso wanted it changed due to some allergy issues like swelling., Other (See Comments)   Thiazide-type Diuretics    Other reaction(s): stop due to gout   Molnupiravir Rash   Other reaction(s): rash        Medication List     STOP taking these medications    celecoxib  200 MG capsule Commonly known as: CELEBREX        TAKE these medications    allopurinol  100 MG tablet Commonly known as: ZYLOPRIM  Take 200 mg by mouth daily.   Arexvy  120 MCG/0.5ML injection Generic drug: RSV vaccine recomb adjuvanted Inject into the muscle.   Aspirin  Low Dose 81 MG chewable tablet Generic drug: aspirin  Chew 1 tablet (81 mg total) by mouth 2 (two) times daily for 20 days. Then take one 81 mg aspirin  once a day for three weeks. Then discontinue aspirin .   b complex vitamins capsule Take 1 capsule by mouth daily.   citalopram  20 MG tablet Commonly known as: CELEXA  Take 1 tablet (20 mg total) by mouth daily.   loratadine  10 MG tablet Commonly known as: CLARITIN  Take 10 mg by mouth daily.   MAGNESIUM PO Take 1 tablet by mouth daily.   methocarbamol  500 MG tablet Commonly known as: ROBAXIN  Take 1 tablet (500 mg total) by mouth every 6 (six) hours as needed for muscle spasms.   nebivolol  10 MG tablet Commonly known as: BYSTOLIC  Take 10 mg by mouth daily.   omeprazole 20 MG capsule Commonly known as: PRILOSEC Take 20 mg by mouth daily.   ondansetron  4 MG  tablet Commonly known as: ZOFRAN  Take 1 tablet (4 mg total) by mouth every 6 (six) hours as needed for nausea.   oxyCODONE  5 MG immediate release tablet Commonly known as: Oxy IR/ROXICODONE  Take 1-2 tablets (5-10 mg total) by mouth every 4 (four) hours as needed for moderate pain (pain score 4-6) (pain score 4-6).   tamoxifen  10 MG tablet Commonly known as: NOLVADEX  Take 1 tablet (10 mg total) by mouth daily.   VITAMIN D PO Take 2,000 Units by mouth daily.               Discharge Care Instructions  (From admission, onward)           Start     Ordered   11/09/23 0000  Weight bearing as tolerated        11/09/23 0747   11/09/23 0000  Change dressing       Comments: You may remove the bulky bandage (  ACE wrap and gauze) two days after surgery. You will have an adhesive waterproof bandage underneath. Leave this in place until your first follow-up appointment.   11/09/23 0747            Follow-up Information     Melodi Lerner, MD. Go on 11/23/2023.   Specialty: Orthopedic Surgery Why: You are scheduled for first post op appt on Tuesday January 28 at 3:15pm. Contact information: [address] [place] [address] [phone]                 Signed: R. Zelda Kobs, PA-C Orthopedic Surgery 11/09/2023, 2:59 PM

## 2023-11-09 NOTE — TOC Transition Note (Signed)
 Transition of Care [facility]) - Discharge Note   Patient Details  Name: Judy Chapman MRN: [id number] Date of Birth: 1946/03/21  Transition of Care [facility]) CM/SW Contact:  Alfonse JONELLE Rex, RN Phone Number: 11/09/2023, 10:51 AM   Clinical Narrative:   Met with patient at bedside, confirmed OPPT at [facility], no home DME needs. No TOC needs.     Final next level of care: OP Rehab Barriers to Discharge: No Barriers Identified   Patient Goals and CMS Choice Patient states their goals for this hospitalization and ongoing recovery are:: return home          Discharge Placement                       Discharge Plan and Services Additional resources added to the After Visit Summary for                  DME Arranged: N/A                    Social Drivers of Health (SDOH) Interventions SDOH Screenings   Food Insecurity: No Food Insecurity (11/08/2023)  Housing: Low Risk  (11/08/2023)  Transportation Needs: No Transportation Needs (11/08/2023)  Utilities: Not At Risk (11/08/2023)  Social Connections: Socially Integrated (11/08/2023)  Tobacco Use: Medium Risk (11/08/2023)     Readmission Risk Interventions     No data to display

## 2023-11-11 ENCOUNTER — Ambulatory Visit (INDEPENDENT_AMBULATORY_CARE_PROVIDER_SITE_OTHER): Payer: Medicare PPO

## 2023-11-11 ENCOUNTER — Other Ambulatory Visit (HOSPITAL_COMMUNITY): Payer: Self-pay

## 2023-11-11 DIAGNOSIS — I495 Sick sinus syndrome: Secondary | ICD-10-CM

## 2023-11-12 DIAGNOSIS — M25562 Pain in left knee: Secondary | ICD-10-CM | POA: Diagnosis not present

## 2023-11-12 DIAGNOSIS — M25662 Stiffness of left knee, not elsewhere classified: Secondary | ICD-10-CM | POA: Diagnosis not present

## 2023-11-14 LAB — CUP PACEART REMOTE DEVICE CHECK
Battery Impedance: 6936 Ohm
Battery Remaining Longevity: 1 mo — CL
Battery Voltage: 2.61 V
Brady Statistic AP VP Percent: 1 %
Brady Statistic AP VS Percent: 63 %
Brady Statistic AS VP Percent: 0 %
Brady Statistic AS VS Percent: 37 %
Date Time Interrogation Session: 20250117115416
Implantable Lead Connection Status: 753985
Implantable Lead Connection Status: 753985
Implantable Lead Implant Date: 20020701
Implantable Lead Implant Date: 20020701
Implantable Lead Location: 753859
Implantable Lead Location: 753860
Implantable Lead Model: 4469
Implantable Lead Model: 4470
Implantable Lead Serial Number: 313853
Implantable Lead Serial Number: 323286
Implantable Pulse Generator Implant Date: 20100908
Lead Channel Impedance Value: 498 Ohm
Lead Channel Impedance Value: 541 Ohm
Lead Channel Pacing Threshold Amplitude: 0.625 V
Lead Channel Pacing Threshold Amplitude: 0.75 V
Lead Channel Pacing Threshold Pulse Width: 0.4 ms
Lead Channel Pacing Threshold Pulse Width: 0.4 ms
Lead Channel Setting Pacing Amplitude: 2 V
Lead Channel Setting Pacing Amplitude: 2.5 V
Lead Channel Setting Pacing Pulse Width: 0.4 ms
Lead Channel Setting Sensing Sensitivity: 2 mV
Zone Setting Status: 755011
Zone Setting Status: 755011

## 2023-11-16 DIAGNOSIS — M25662 Stiffness of left knee, not elsewhere classified: Secondary | ICD-10-CM | POA: Diagnosis not present

## 2023-11-16 DIAGNOSIS — M25562 Pain in left knee: Secondary | ICD-10-CM | POA: Diagnosis not present

## 2023-11-19 DIAGNOSIS — M25562 Pain in left knee: Secondary | ICD-10-CM | POA: Diagnosis not present

## 2023-11-19 DIAGNOSIS — M25662 Stiffness of left knee, not elsewhere classified: Secondary | ICD-10-CM | POA: Diagnosis not present

## 2023-11-23 DIAGNOSIS — M25662 Stiffness of left knee, not elsewhere classified: Secondary | ICD-10-CM | POA: Diagnosis not present

## 2023-11-23 DIAGNOSIS — M25562 Pain in left knee: Secondary | ICD-10-CM | POA: Diagnosis not present

## 2023-11-26 DIAGNOSIS — M25562 Pain in left knee: Secondary | ICD-10-CM | POA: Diagnosis not present

## 2023-11-26 DIAGNOSIS — M25662 Stiffness of left knee, not elsewhere classified: Secondary | ICD-10-CM | POA: Diagnosis not present

## 2023-11-29 DIAGNOSIS — M25662 Stiffness of left knee, not elsewhere classified: Secondary | ICD-10-CM | POA: Diagnosis not present

## 2023-11-29 DIAGNOSIS — M25562 Pain in left knee: Secondary | ICD-10-CM | POA: Diagnosis not present

## 2023-12-02 DIAGNOSIS — M25562 Pain in left knee: Secondary | ICD-10-CM | POA: Diagnosis not present

## 2023-12-02 DIAGNOSIS — M25662 Stiffness of left knee, not elsewhere classified: Secondary | ICD-10-CM | POA: Diagnosis not present

## 2023-12-07 DIAGNOSIS — M25562 Pain in left knee: Secondary | ICD-10-CM | POA: Diagnosis not present

## 2023-12-07 DIAGNOSIS — M25662 Stiffness of left knee, not elsewhere classified: Secondary | ICD-10-CM | POA: Diagnosis not present

## 2023-12-09 ENCOUNTER — Encounter: Payer: Self-pay | Admitting: Gastroenterology

## 2023-12-14 DIAGNOSIS — M25562 Pain in left knee: Secondary | ICD-10-CM | POA: Diagnosis not present

## 2023-12-14 DIAGNOSIS — M25662 Stiffness of left knee, not elsewhere classified: Secondary | ICD-10-CM | POA: Diagnosis not present

## 2023-12-14 DIAGNOSIS — Z5189 Encounter for other specified aftercare: Secondary | ICD-10-CM | POA: Diagnosis not present

## 2023-12-16 ENCOUNTER — Ambulatory Visit (INDEPENDENT_AMBULATORY_CARE_PROVIDER_SITE_OTHER): Payer: Medicare PPO

## 2023-12-16 DIAGNOSIS — I495 Sick sinus syndrome: Secondary | ICD-10-CM

## 2023-12-17 ENCOUNTER — Telehealth: Payer: Self-pay

## 2023-12-17 DIAGNOSIS — M25662 Stiffness of left knee, not elsewhere classified: Secondary | ICD-10-CM | POA: Diagnosis not present

## 2023-12-17 DIAGNOSIS — M25562 Pain in left knee: Secondary | ICD-10-CM | POA: Diagnosis not present

## 2023-12-17 LAB — CUP PACEART REMOTE DEVICE CHECK
Battery Impedance: 9008 Ohm
Battery Voltage: 2.59 V
Brady Statistic RV Percent Paced: 1 %
Date Time Interrogation Session: 20250220103722
Implantable Lead Connection Status: 753985
Implantable Lead Connection Status: 753985
Implantable Lead Implant Date: 20020701
Implantable Lead Implant Date: 20020701
Implantable Lead Location: 753859
Implantable Lead Location: 753860
Implantable Lead Model: 4469
Implantable Lead Model: 4470
Implantable Lead Serial Number: 313853
Implantable Lead Serial Number: 323286
Implantable Pulse Generator Implant Date: 20100908
Lead Channel Impedance Value: 515 Ohm
Lead Channel Impedance Value: 67 Ohm
Lead Channel Setting Pacing Amplitude: 2.5 V
Lead Channel Setting Pacing Pulse Width: 0.4 ms
Lead Channel Setting Sensing Sensitivity: 2.8 mV
Zone Setting Status: 755011
Zone Setting Status: 755011

## 2023-12-17 NOTE — Telephone Encounter (Signed)
Pacemaker reached ERI 11/30/23. Needs apt to discuss gen change. Routing to EP scheduler.

## 2023-12-21 DIAGNOSIS — M25662 Stiffness of left knee, not elsewhere classified: Secondary | ICD-10-CM | POA: Diagnosis not present

## 2023-12-21 DIAGNOSIS — M25562 Pain in left knee: Secondary | ICD-10-CM | POA: Diagnosis not present

## 2023-12-21 NOTE — Progress Notes (Signed)
 Remote pacemaker transmission.

## 2023-12-21 NOTE — Addendum Note (Signed)
 Addended by: Elease Etienne A on: 12/21/2023 09:47 AM   Modules accepted: Orders

## 2023-12-22 ENCOUNTER — Encounter: Payer: Self-pay | Admitting: Internal Medicine

## 2023-12-24 DIAGNOSIS — M25662 Stiffness of left knee, not elsewhere classified: Secondary | ICD-10-CM | POA: Diagnosis not present

## 2023-12-24 DIAGNOSIS — M25562 Pain in left knee: Secondary | ICD-10-CM | POA: Diagnosis not present

## 2023-12-30 NOTE — H&P (View-Only) (Signed)
  Electrophysiology Office Note:   ID:  Judy Chapman, Judy Chapman 08/15/1946, MRN [id number]  Primary Cardiologist: None Electrophysiologist: Sherryl Manges, MD      History of Present Illness:   Judy Chapman is a 78 y.o. female with h/o HTN, SND s/p PPM, subclinical AF, and DOE seen today for routine electrophysiology followup.   Since last being seen in our clinic the patient reports doing well from a cardiac perspective. Had recent knee surgery, and had a positive cologuard, so is awaiting GI follow up 3/31 to discuss. Otherwise,  she denies chest pain, palpitations, dyspnea, PND, orthopnea, nausea, vomiting, dizziness, syncope, edema, weight gain, or early satiety.   Review of systems complete and found to be negative unless listed in HPI.   EP Information / Studies Reviewed:    EKG is ordered today. Personal review as below.  EKG Interpretation Date/Time:  Friday December 31 2023 12:16:45 EST Ventricular Rate:  65 PR Interval:    QRS Duration:  138 QT Interval:  438 QTC Calculation: 455 R Axis:   -68  Text Interpretation: Ventricular-paced rhythm When compared with ECG of 30-Jun-2023 15:54, Electronic ventricular pacemaker has replaced Electronic atrial pacemaker Confirmed by Maxine Glenn [phone]) on 12/31/2023 12:17:36 PM    PPM Interrogation-  reviewed in detail today,  See PACEART report.  Arrhythmia/Device History Medtronic Dual Chamber implanted 04/2001 for SND.  Gen change 07/03/2009   Physical Exam:   VS:  BP 100/62   Pulse 65   Ht 5\' 4"  (1.626 m)   Wt 171 lb 3.2 oz (77.7 kg)   SpO2 96%   BMI 29.39 kg/m    Wt Readings from Last 3 Encounters:  12/31/23 171 lb 3.2 oz (77.7 kg)  11/08/23 176 lb (79.8 kg)  11/03/23 176 lb (79.8 kg)     GEN: No acute distress  NECK: No JVD; No carotid bruits CARDIAC: Regular rate and rhythm, no murmurs, rubs, gallops RESPIRATORY:  Clear to auscultation without rales, wheezing or rhonchi  ABDOMEN: Soft, non-tender,  non-distended EXTREMITIES:  No edema; No deformity   ASSESSMENT AND PLAN:    SND s/p Medtronic PPM  Normal PPM function for device at ERI as of 2/4 (VVI 65, adapta) See Pace Art report Gen change scheduled today.  Explained risks, benefits, and alternatives to pacemaker generator change, including but not limited to bleeding, infection, pneumothorax, pericardial effusion, lead dislodgement, heart attack, stroke, or death.  Pt verbalized understanding and agrees to proceed.      HTN Stable on current regimen   Subclinical AF Follow burden  Not available on check today (ERI)   Disposition:   Follow up with Dr. Graciela Husbands as usual post procedure  Signed, Graciella Freer, PA-C

## 2023-12-30 NOTE — Progress Notes (Signed)
  Electrophysiology Office Note:   ID:  Judy Chapman, Judy Chapman 08/15/1946, MRN [id number]  Primary Cardiologist: None Electrophysiologist: Sherryl Manges, MD      History of Present Illness:   Judy Chapman is a 78 y.o. female with h/o HTN, SND s/p PPM, subclinical AF, and DOE seen today for routine electrophysiology followup.   Since last being seen in our clinic the patient reports doing well from a cardiac perspective. Had recent knee surgery, and had a positive cologuard, so is awaiting GI follow up 3/31 to discuss. Otherwise,  she denies chest pain, palpitations, dyspnea, PND, orthopnea, nausea, vomiting, dizziness, syncope, edema, weight gain, or early satiety.   Review of systems complete and found to be negative unless listed in HPI.   EP Information / Studies Reviewed:    EKG is ordered today. Personal review as below.  EKG Interpretation Date/Time:  Friday December 31 2023 12:16:45 EST Ventricular Rate:  65 PR Interval:    QRS Duration:  138 QT Interval:  438 QTC Calculation: 455 R Axis:   -68  Text Interpretation: Ventricular-paced rhythm When compared with ECG of 30-Jun-2023 15:54, Electronic ventricular pacemaker has replaced Electronic atrial pacemaker Confirmed by Maxine Glenn [phone]) on 12/31/2023 12:17:36 PM    PPM Interrogation-  reviewed in detail today,  See PACEART report.  Arrhythmia/Device History Medtronic Dual Chamber implanted 04/2001 for SND.  Gen change 07/03/2009   Physical Exam:   VS:  BP 100/62   Pulse 65   Ht 5\' 4"  (1.626 m)   Wt 171 lb 3.2 oz (77.7 kg)   SpO2 96%   BMI 29.39 kg/m    Wt Readings from Last 3 Encounters:  12/31/23 171 lb 3.2 oz (77.7 kg)  11/08/23 176 lb (79.8 kg)  11/03/23 176 lb (79.8 kg)     GEN: No acute distress  NECK: No JVD; No carotid bruits CARDIAC: Regular rate and rhythm, no murmurs, rubs, gallops RESPIRATORY:  Clear to auscultation without rales, wheezing or rhonchi  ABDOMEN: Soft, non-tender,  non-distended EXTREMITIES:  No edema; No deformity   ASSESSMENT AND PLAN:    SND s/p Medtronic PPM  Normal PPM function for device at ERI as of 2/4 (VVI 65, adapta) See Pace Art report Gen change scheduled today.  Explained risks, benefits, and alternatives to pacemaker generator change, including but not limited to bleeding, infection, pneumothorax, pericardial effusion, lead dislodgement, heart attack, stroke, or death.  Pt verbalized understanding and agrees to proceed.      HTN Stable on current regimen   Subclinical AF Follow burden  Not available on check today (ERI)   Disposition:   Follow up with Dr. Graciela Husbands as usual post procedure  Signed, Graciella Freer, PA-C

## 2023-12-31 ENCOUNTER — Encounter: Payer: Self-pay | Admitting: Student

## 2023-12-31 ENCOUNTER — Ambulatory Visit: Payer: Medicare PPO | Attending: Student | Admitting: Student

## 2023-12-31 VITALS — BP 100/62 | HR 65 | Ht 64.0 in | Wt 171.2 lb

## 2023-12-31 DIAGNOSIS — R0609 Other forms of dyspnea: Secondary | ICD-10-CM

## 2023-12-31 DIAGNOSIS — Z01810 Encounter for preprocedural cardiovascular examination: Secondary | ICD-10-CM | POA: Diagnosis not present

## 2023-12-31 DIAGNOSIS — I495 Sick sinus syndrome: Secondary | ICD-10-CM

## 2023-12-31 LAB — CUP PACEART INCLINIC DEVICE CHECK
Battery Impedance: 9562 Ohm
Battery Voltage: 2.56 V
Brady Statistic RV Percent Paced: 1 %
Date Time Interrogation Session: 20250307123227
Implantable Lead Connection Status: 753985
Implantable Lead Connection Status: 753985
Implantable Lead Implant Date: 20020701
Implantable Lead Implant Date: 20020701
Implantable Lead Location: 753859
Implantable Lead Location: 753860
Implantable Lead Model: 4469
Implantable Lead Model: 4470
Implantable Lead Serial Number: 313853
Implantable Lead Serial Number: 323286
Implantable Pulse Generator Implant Date: 20100908
Lead Channel Impedance Value: 506 Ohm
Lead Channel Impedance Value: 67 Ohm
Lead Channel Setting Pacing Amplitude: 2.5 V
Lead Channel Setting Pacing Pulse Width: 0.4 ms
Lead Channel Setting Sensing Sensitivity: 2.8 mV
Zone Setting Status: 755011
Zone Setting Status: 755011

## 2023-12-31 NOTE — Telephone Encounter (Signed)
 Pt scheduled for generator change on 01/26/24

## 2023-12-31 NOTE — Patient Instructions (Addendum)
 Medication Instructions:  Your physician recommends that you continue on your current medications as directed. Please refer to the Current Medication list given to you today.  *If you need a refill on your cardiac medications before your next appointment, please call your pharmacy*   Lab Work: BMET, CBC - please have these drawn at any [facility] location  Testing/Procedures: Pacemaker Generator Change - see instruction letter  Follow-Up: At [facility], you and your health needs are our priority.  As part of our continuing mission to provide you with exceptional heart care, we have created designated Provider Care Teams.  These Care Teams include your primary Cardiologist (physician) and Advanced Practice Providers (APPs -  Physician Assistants and Nurse Practitioners) who all work together to provide you with the care you need, when you need it.  We recommend signing up for the patient portal called "MyChart".  Sign up information is provided on this After Visit Summary.  MyChart is used to connect with patients for Virtual Visits (Telemedicine).  Patients are able to view lab/test results, encounter notes, upcoming appointments, etc.  Non-urgent messages can be sent to your provider as well.   To learn more about what you can do with MyChart, go to [email or web address].    Your next appointment:   We will call you to schedule your post-procedure appointments

## 2024-01-01 LAB — CBC
Hematocrit: 36.9 % (ref 34.0–46.6)
Hemoglobin: 11.7 g/dL (ref 11.1–15.9)
MCH: 31.5 pg (ref 26.6–33.0)
MCHC: 31.7 g/dL (ref 31.5–35.7)
MCV: 100 fL — ABNORMAL HIGH (ref 79–97)
Platelets: 187 10*3/uL (ref 150–450)
RBC: 3.71 x10E6/uL — ABNORMAL LOW (ref 3.77–5.28)
RDW: 12.8 % (ref 11.7–15.4)
WBC: 6.2 10*3/uL (ref 3.4–10.8)

## 2024-01-01 LAB — BASIC METABOLIC PANEL
BUN/Creatinine Ratio: 23 (ref 12–28)
BUN: 22 mg/dL (ref 8–27)
CO2: 21 mmol/L (ref 20–29)
Calcium: 9.1 mg/dL (ref 8.7–10.3)
Chloride: 107 mmol/L — ABNORMAL HIGH (ref 96–106)
Creatinine, Ser: 0.96 mg/dL (ref 0.57–1.00)
Glucose: 93 mg/dL (ref 70–99)
Potassium: 4.7 mmol/L (ref 3.5–5.2)
Sodium: 143 mmol/L (ref 134–144)
eGFR: 60 mL/min/{1.73_m2} (ref >59)

## 2024-01-03 ENCOUNTER — Telehealth (HOSPITAL_COMMUNITY): Payer: Self-pay

## 2024-01-03 NOTE — Telephone Encounter (Signed)
 Attempted to reach patient to discuss upcoming procedure, patient's husband stated she was not home at the moment. Left message with contact information for patient to return call.

## 2024-01-04 NOTE — Telephone Encounter (Signed)
 Spoke with patient to complete one month pre-procedure call.     New medical conditions?  No Recent hospitalizations or surgeries? Left TKA on 11/08/23 Started any new medications? No Patient made aware to contact office to inform of any new medications started. Any changes in activities of daily living? No  Pre-procedure testing scheduled: lab work completed on 12/31/23  Confirmed patient is scheduled for  PPM generator change on Wednesday, April 2 with Dr. Sherryl Manges. Instructed patient to arrive at the Main Entrance A at [facility]: [address] [place], [place] [address] and check in at Admitting at 8:30 AM.  Advised of plan to go home the same day and will only stay overnight if medically necessary. You MUST have a responsible adult to drive you home and MUST be with you the first 24 hours after you arrive home or your procedure could be cancelled.  Patient verbalized understanding to information provided and is agreeable to proceed with procedure.

## 2024-01-11 ENCOUNTER — Encounter: Payer: Self-pay | Admitting: Internal Medicine

## 2024-01-11 NOTE — Telephone Encounter (Signed)
**Note De-identified  [place] Obfuscation** Please advise 

## 2024-01-17 ENCOUNTER — Ambulatory Visit (INDEPENDENT_AMBULATORY_CARE_PROVIDER_SITE_OTHER): Payer: Medicare PPO

## 2024-01-17 DIAGNOSIS — I495 Sick sinus syndrome: Secondary | ICD-10-CM

## 2024-01-19 ENCOUNTER — Telehealth (HOSPITAL_COMMUNITY): Payer: Self-pay

## 2024-01-19 DIAGNOSIS — M1711 Unilateral primary osteoarthritis, right knee: Secondary | ICD-10-CM | POA: Diagnosis not present

## 2024-01-19 DIAGNOSIS — Z5189 Encounter for other specified aftercare: Secondary | ICD-10-CM | POA: Diagnosis not present

## 2024-01-19 LAB — CUP PACEART REMOTE DEVICE CHECK
Battery Impedance: 10185 Ohm
Battery Voltage: 2.56 V
Brady Statistic RV Percent Paced: 1 %
Date Time Interrogation Session: 20250325095455
Implantable Lead Connection Status: 753985
Implantable Lead Connection Status: 753985
Implantable Lead Implant Date: 20020701
Implantable Lead Implant Date: 20020701
Implantable Lead Location: 753859
Implantable Lead Location: 753860
Implantable Lead Model: 4469
Implantable Lead Model: 4470
Implantable Lead Serial Number: 313853
Implantable Lead Serial Number: 323286
Implantable Pulse Generator Implant Date: 20100908
Lead Channel Impedance Value: 481 Ohm
Lead Channel Impedance Value: 67 Ohm
Lead Channel Setting Pacing Amplitude: 2.5 V
Lead Channel Setting Pacing Pulse Width: 0.4 ms
Lead Channel Setting Sensing Sensitivity: 2.8 mV
Zone Setting Status: 755011
Zone Setting Status: 755011

## 2024-01-19 NOTE — Addendum Note (Signed)
 Addended by: Elease Etienne A on: 01/19/2024 11:01 AM   Modules accepted: Orders

## 2024-01-19 NOTE — Telephone Encounter (Signed)
 Call placed to patient to discuss upcoming procedure.   Confirmed patient is scheduled for a PPM generator change on Wednesday, April 2 with Dr. Sherryl Manges. Instructed patient to arrive at the Main Entrance A at [facility]: [address] [place], [place] [address] and check in at Admitting at 8:30 AM.   Labs completed  Any recent signs of acute illness or been started on antibiotics? No Any new medications started? No Any medications to hold? No Medication instructions:  On the morning of your procedure take your morning medications with a small sip of water  No eating or drinking after midnight prior to procedure.   The night before your procedure and the morning of your procedure scrub your neck/chest with the CHG surgical soap.   Advised of plan to go home the same day and will only stay overnight if medically necessary. You MUST have a responsible adult to drive you home and MUST be with you the first 24 hours after you arrive home.  Patient verbalized understanding to all instructions provided and agreed to proceed with procedure.

## 2024-01-19 NOTE — Progress Notes (Signed)
 Remote pacemaker transmission.

## 2024-01-24 ENCOUNTER — Ambulatory Visit: Payer: Medicare PPO | Admitting: Gastroenterology

## 2024-01-24 ENCOUNTER — Encounter: Payer: Self-pay | Admitting: Physician Assistant

## 2024-01-24 ENCOUNTER — Ambulatory Visit: Payer: Medicare PPO | Admitting: Physician Assistant

## 2024-01-24 VITALS — BP 106/60 | HR 71 | Ht 64.0 in | Wt 170.0 lb

## 2024-01-24 DIAGNOSIS — K760 Fatty (change of) liver, not elsewhere classified: Secondary | ICD-10-CM | POA: Diagnosis not present

## 2024-01-24 DIAGNOSIS — R195 Other fecal abnormalities: Secondary | ICD-10-CM | POA: Diagnosis not present

## 2024-01-24 DIAGNOSIS — I495 Sick sinus syndrome: Secondary | ICD-10-CM

## 2024-01-24 DIAGNOSIS — D539 Nutritional anemia, unspecified: Secondary | ICD-10-CM

## 2024-01-24 DIAGNOSIS — D519 Vitamin B12 deficiency anemia, unspecified: Secondary | ICD-10-CM

## 2024-01-24 DIAGNOSIS — J849 Interstitial pulmonary disease, unspecified: Secondary | ICD-10-CM | POA: Diagnosis not present

## 2024-01-24 MED ORDER — NA SULFATE-K SULFATE-MG SULF 17.5-3.13-1.6 GM/177ML PO SOLN: 1.0000 | Freq: Once | ORAL | 0 refills | Status: AC

## 2024-01-24 NOTE — Patient Instructions (Addendum)
 Miralax is an osmotic laxative.  It only brings more water into the stool.  This is safe to take daily.  Can take up to 17 gram of miralax twice a day.  Mix with juice or coffee.  Start 1 capful at night for 3-4 days and reassess your response in 3-4 days.  You can increase and decrease the dose based on your response.  Remember, it can take up to 3-4 days to take effect OR for the effects to wear off.   We will try and obtain your labs from Dr Waynard Edwards  I often pair this with benefiber in the morning to help assure the stool is not too loose.   Toileting tips to help with your constipation - Drink at least 64-80 ounces of water/liquid per day. - Establish a time to try to move your bowels every day.  For many people, this is after a cup of coffee or after a meal such as breakfast. - Sit all of the way back on the toilet keeping your back fairly straight and while sitting up, try to rest the tops of your forearms on your upper thighs.   - Raising your feet with a step stool/squatty potty can be helpful to improve the angle that allows your stool to pass through the rectum. - Relax the rectum feeling it bulge toward the toilet water.  If you feel your rectum raising toward your body, you are contracting rather than relaxing. - Breathe in and slowly exhale. "Belly breath" by expanding your belly towards your belly button. Keep belly expanded as you gently direct pressure down and back to the anus.  A low pitched GRRR sound can assist with increasing intra-abdominal pressure.  (Can also trying to blow on a pinwheel and make it move, this helps with the same belly breathing) - Repeat 3-4 times. If unsuccessful, contract the pelvic floor to restore normal tone and get off the toilet.  Avoid excessive straining. - To reduce excessive wiping by teaching your anus to normally contract, place hands on outer aspect of knees and resist knee movement outward.  Hold 5-10 second then place hands just inside of  knees and resist inward movement of knees.  Hold 5 seconds.  Repeat a few times each way.  Go to the ER if unable to pass gas, severe AB pain, unable to hold down food, any shortness of breath of chest pain.  You have been scheduled for a colonoscopy. Please follow written instructions given to you at your visit today.   If you use inhalers (even only as needed), please bring them with you on the day of your procedure.  DO NOT TAKE 7 DAYS PRIOR TO TEST- Trulicity (dulaglutide) Ozempic, Wegovy (semaglutide) Mounjaro (tirzepatide) Bydureon Bcise (exanatide extended release)  DO NOT TAKE 1 DAY PRIOR TO YOUR TEST Rybelsus (semaglutide) Adlyxin (lixisenatide) Victoza (liraglutide) Byetta (exanatide) ___________________________________________________________________________   Due to recent changes in healthcare laws, you may see the results of your imaging and laboratory studies on MyChart before your provider has had a chance to review them.  We understand that in some cases there may be results that are confusing or concerning to you. Not all laboratory results come back in the same time frame and the provider may be waiting for multiple results in order to interpret others.  Please give [place] 48 hours in order for your provider to thoroughly review all the results before contacting the office for clarification of your results.    I appreciate  the  opportunity to care for you  Thank You   [facility]

## 2024-01-24 NOTE — Progress Notes (Signed)
 01/24/2024 Judy Chapman [id number] 01/01/1946  Referring provider: Rodrigo Ran, MD Primary GI doctor: Dr. Adela Lank ( Dr. Arlyce Dice)  ASSESSMENT AND PLAN:   Positive cologuard 10/2023 Screening colonoscopy 2014 with Dr. Arlyce Dice Internal hemorrhoids otherwise unremarkable recall 10 years No family history of colon cancer Has more constipation since her knee replacement in Jan, using miralax as needed and prune juice No hematochezia, no AB pain, mild weight loss due to surgery Will plan on colonoscopy at [facility] We have discussed the risks of bleeding, infection, perforation, medication reactions, and remote risk of death associated with colonoscopy. All questions were answered and the patient acknowledges these risk and wishes to proceed.  Macrocytic anemia 12/31/2023  HGB 11.7 MCV 100 Platelets 187 Started on B12 recently, had labs with PCP that showed normal iron, I do not have these, try to get labs  Hepatic steatosis Seen on CT chest 06/11/2023 Unremarkable liver function 11/03/2023 and in review Monitor CBC/LFTs every 6 months  Interstitial lung disease Follows Dr. Delton Coombes CT chest mild bibasilar predominant subpleural reticular densities groundglass, unchanged from CT 2023 Not on oxygen, some SOB at this time, due for pacemaker replacement on Wednesday  Sick sinus syndrome status post PPM Subclinical AF- not on blood thinner Echo 2023, EF 50-60%, normal valves Follows with Dr. Graciela Husbands, getting battery replaced Wednesday, should be fine for LEC after that  Patient Care Team: Rodrigo Ran, MD as PCP - General (Internal Medicine) Duke Salvia, MD as PCP - Electrophysiology (Cardiology) Griselda Miner, MD as Consulting Physician (General Surgery) Lonie Peak, MD as Attending Physician (Radiation Oncology) Ollen Gross, MD as Consulting Physician (Orthopedic Surgery) Serena Croissant, MD as Consulting Physician (Hematology and Oncology)  HISTORY OF PRESENT  ILLNESS: Discussed the use of AI scribe software for clinical note transcription with the patient, who gave verbal consent to proceed.  History of Present Illness   Judy Chapman is an 78 year old female who presents for evaluation following a positive Cologuard test.  She had a positive Cologuard test in January. Her last colonoscopy in 2014 showed hemorrhoids but was otherwise normal. She denies changes in bowel habits, dark or bloody stools, and abdominal discomfort.  She experiences increased constipation since her knee replacement surgery, which she attributes to anesthesia. She occasionally uses MiraLAX but finds prune juice more effective, using it as needed. She has daily bowel movements now without blood in the stool.  She reports a slight weight loss, which she attributes to her recent knee surgery. She is aware of being slightly anemic and is on B12 supplements due to a previous deficiency. Her iron levels were checked and were normal per patient, we do not have these records. She denies upper gastrointestinal symptoms such as heartburn or trouble swallowing.  She reports shortness of breath, which she attributes to the pacemaker battery nearing the end of its life. No chest pain. Getting replacement Wednesday.  She has a history of interstitial lung disease and follows with Dr. Corliss Blacker. It is not progressive, and she is not on oxygen. No shortness of breath related to this condition.      She  reports that she quit smoking about 18 years ago. Her smoking use included cigarettes. She started smoking about 28 years ago. She has a 3 pack-year smoking history. She has never been exposed to tobacco smoke. She has never used smokeless tobacco. She reports current alcohol use of about 12.0 standard drinks of alcohol per week. She reports that she  does not use drugs.  RELEVANT GI HISTORY, IMAGING AND LABS:  DIAGNOSTIC Cologuard: Positive (10/2023)      CBC    Component Value  Date/Time   WBC 6.2 12/31/2023 1330   WBC 11.3 (H) 11/09/2023 0344   RBC 3.71 (L) 12/31/2023 1330   RBC 3.08 (L) 11/09/2023 0344   HGB 11.7 12/31/2023 1330   HCT 36.9 12/31/2023 1330   PLT 187 12/31/2023 1330   MCV 100 (H) 12/31/2023 1330   MCH 31.5 12/31/2023 1330   MCH 33.4 11/09/2023 0344   MCHC 31.7 12/31/2023 1330   MCHC 32.7 11/09/2023 0344   RDW 12.8 12/31/2023 1330   LYMPHSABS 1.2 08/04/2022 1128   MONOABS 0.6 08/04/2022 1128   EOSABS 0.1 08/04/2022 1128   BASOSABS 0.1 08/04/2022 1128   Recent Labs    11/03/23 1125 11/09/23 0344 12/31/23 1330  HGB 12.9 10.3* 11.7    CMP     Component Value Date/Time   NA 143 12/31/2023 1330   K 4.7 12/31/2023 1330   CL 107 (H) 12/31/2023 1330   CO2 21 12/31/2023 1330   GLUCOSE 93 12/31/2023 1330   GLUCOSE 118 (H) 11/09/2023 0344   BUN 22 12/31/2023 1330   CREATININE 0.96 12/31/2023 1330   CREATININE 0.91 08/04/2022 1128   CALCIUM 9.1 12/31/2023 1330   PROT 6.5 11/03/2023 1125   ALBUMIN 3.7 11/03/2023 1125   AST 27 11/03/2023 1125   AST 21 08/04/2022 1128   ALT 21 11/03/2023 1125   ALT 23 08/04/2022 1128   ALKPHOS 39 11/03/2023 1125   BILITOT 0.7 11/03/2023 1125   BILITOT 0.6 08/04/2022 1128   GFRNONAA >60 11/09/2023 0344   GFRNONAA >60 08/04/2022 1128   GFRAA >60 03/20/2020 0827      Latest Ref Rng & Units 11/03/2023   11:25 AM 08/04/2022   11:28 AM 10/15/2021   11:30 AM  Hepatic Function  Total Protein 6.5 - 8.1 g/dL 6.5  6.5  6.7   Albumin 3.5 - 5.0 g/dL 3.7  3.9  4.1   AST 15 - 41 U/L 27  21  22    ALT 0 - 44 U/L 21  23  19    Alk Phosphatase 38 - 126 U/L 39  39  47   Total Bilirubin 0.0 - 1.2 mg/dL 0.7  0.6  0.7       Current Medications:    Current Outpatient Medications (Cardiovascular):    nebivolol (BYSTOLIC) 10 MG tablet, Take 10 mg by mouth daily.  Current Outpatient Medications (Respiratory):    loratadine (CLARITIN) 10 MG tablet, Take 10 mg by mouth daily.  Current Outpatient Medications  (Analgesics):    allopurinol (ZYLOPRIM) 100 MG tablet, Take 200 mg by mouth daily.   celecoxib (CELEBREX) 200 MG capsule, Take 200 mg by mouth daily.   Current Outpatient Medications (Other):    b complex vitamins capsule, Take 1 capsule by mouth daily.   Cholecalciferol (VITAMIN D PO), Take 2,000 Units by mouth daily.    citalopram (CELEXA) 20 MG tablet, Take 1 tablet (20 mg total) by mouth daily.   MAGNESIUM PO, Take 1 tablet by mouth daily.   methocarbamol (ROBAXIN) 500 MG tablet, Take 1 tablet (500 mg total) by mouth every 6 (six) hours as needed for muscle spasms.   Na Sulfate-K Sulfate-Mg Sulfate concentrate (SUPREP) 17.5-3.13-1.6 GM/177ML SOLN, Take 1 kit (354 mLs total) by mouth once for 1 dose.   omeprazole (PRILOSEC) 20 MG capsule, Take 20 mg by mouth daily.  tamoxifen (NOLVADEX) 10 MG tablet, Take 1 tablet (10 mg total) by mouth daily.  Medical History:  Past Medical History:  Diagnosis Date   ACE inhibitor intolerance    Arthritis    Asthma    outgrew as a child   Breast cancer (HCC)    Cancer (HCC)    right breast   Dysrhythmia    SSS   Early cataract    Endometriosis    GERD (gastroesophageal reflux disease)    Gout    Heart disease    Hypertension    Interstitial lung disease (HCC)    Osteopenia    Pacemaker-MDT    DOI-2002, Generator change 2010   Pneumonia    PVC (premature ventricular contraction)    Sinus node dysfunction (HCC)    Wears glasses    Allergies:  Allergies  Allergen Reactions   Augmentin [Amoxicillin-Pot Clavulanate] Diarrhea   Anastrozole     Other reaction(s): couldn't sleep, Other (See Comments)   Nitrofurantoin Other (See Comments)    Sweating and chills     Pravastatin     Other reaction(s): felt miserable on it.felt hot and sweaty in the night.   Ramipril Hives and Swelling      Other reaction(s): dr. Elysian Callas wanted it changed due to some allergy issues like swelling., Other (See Comments)   Thiazide-Type Diuretics      Other reaction(s): stop due to gout   Uloric [Febuxostat] Other (See Comments)    Other reaction(s): made her itch. urine smelled bad. felt better off of it. but could go back on it if needed she says   Molnupiravir Rash    Other reaction(s): rash     Surgical History:  She  has a past surgical history that includes Coronary angioplasty; Pacemaker insertion; Vaginal hysterectomy; Gallbladder surgery; Tubal ligation; Dilation and curettage of uterus; Hysteroscopy; Wisdom tooth extraction; Tonsillectomy; Breast lumpectomy with radioactive seed localization (Right, 03/27/2020); Eye surgery (Bilateral); and Total knee arthroplasty (Left, 11/08/2023). Family History:  Her family history includes Dupuytren's contracture in her father; Hypertension in her mother; Stomach cancer (age of onset: 110) in her paternal grandfather.  REVIEW OF SYSTEMS  : All other systems reviewed and negative except where noted in the History of Present Illness.  PHYSICAL EXAM: BP 106/60   Pulse 71   Ht 5\' 4"  (1.626 m)   Wt 170 lb (77.1 kg)   BMI 29.18 kg/m  Physical Exam   GENERAL APPEARANCE: Well nourished, in no apparent distress. HEENT: No cervical lymphadenopathy, unremarkable thyroid, sclerae anicteric, conjunctiva pink. RESPIRATORY: Respiratory effort normal, breath sounds equal bilaterally without rales, rhonchi, or wheezing. Lungs clear to auscultation bilaterally. CARDIO: Regular rate and rhythm with no murmurs, rubs, or gallops, peripheral pulses intact. ABDOMEN: Soft, non-distended, active bowel sounds in all four quadrants, no tenderness to palpation, no rebound, no mass appreciated. RECTAL: Declines. MUSCULOSKELETAL: Full range of motion, normal gait, without edema. SKIN: Dry, intact without rashes or lesions. No jaundice. NEURO: Alert, oriented, no focal deficits. PSYCH: Cooperative, normal mood and affect. EXTREMITIES: No edema in extremities.      Doree Albee, PA-C 9:04 AM

## 2024-01-24 NOTE — Progress Notes (Signed)
 Agree with assessment and plan as outlined.

## 2024-01-25 NOTE — Pre-Procedure Instructions (Signed)
Instructed patient on the following items: Arrival time 0830 Nothing to eat or drink after midnight No meds AM of procedure Responsible person to drive you home and stay with you for 24 hrs Wash with special soap night before and morning of procedure  

## 2024-01-26 ENCOUNTER — Encounter (HOSPITAL_COMMUNITY): Payer: Self-pay | Admitting: Internal Medicine

## 2024-01-26 ENCOUNTER — Other Ambulatory Visit: Payer: Self-pay

## 2024-01-26 ENCOUNTER — Ambulatory Visit (HOSPITAL_COMMUNITY)
Admission: RE | Disposition: A | Discharge status: Home / Self Care | Payer: Self-pay | Source: Home / Self Care | Attending: Internal Medicine

## 2024-01-26 ENCOUNTER — Ambulatory Visit (HOSPITAL_COMMUNITY)
Admission: RE | Admit: 2024-01-26 | Discharge: 2024-01-26 | Disposition: A | Discharge status: Home / Self Care | Attending: Internal Medicine | Admitting: Internal Medicine

## 2024-01-26 DIAGNOSIS — I495 Sick sinus syndrome: Secondary | ICD-10-CM | POA: Insufficient documentation

## 2024-01-26 DIAGNOSIS — I10 Essential (primary) hypertension: Secondary | ICD-10-CM | POA: Diagnosis not present

## 2024-01-26 DIAGNOSIS — Z45010 Encounter for checking and testing of cardiac pacemaker pulse generator [battery]: Secondary | ICD-10-CM | POA: Insufficient documentation

## 2024-01-26 HISTORY — PX: PPM GENERATOR CHANGEOUT: EP1233

## 2024-01-26 SURGERY — PPM GENERATOR CHANGEOUT
Anesthesia: LOCAL

## 2024-01-26 MED ORDER — SODIUM CHLORIDE 0.9 % IV SOLN: INTRAVENOUS | Status: DC

## 2024-01-26 MED ORDER — VANCOMYCIN HCL IN DEXTROSE 1-5 GM/200ML-% IV SOLN: 1000.0000 mg | INTRAVENOUS | Status: AC

## 2024-01-26 MED ORDER — ACETAMINOPHEN 325 MG PO TABS: 325.0000 mg | ORAL_TABLET | ORAL | Status: DC | PRN

## 2024-01-26 MED ORDER — POVIDONE-IODINE 10 % EX SWAB
2.0000 | Freq: Once | CUTANEOUS | Status: AC
  Administered 2024-01-26: 2 via TOPICAL

## 2024-01-26 MED ORDER — LIDOCAINE HCL (PF) 1 % IJ SOLN
INTRAMUSCULAR | Status: AC
  Filled 2024-01-26: qty 60

## 2024-01-26 MED ORDER — CHLORHEXIDINE GLUCONATE 4 % EX SOLN: 4.0000 | Freq: Once | CUTANEOUS | Status: DC

## 2024-01-26 MED ORDER — FENTANYL CITRATE (PF) 100 MCG/2ML IJ SOLN
INTRAMUSCULAR | Status: AC
  Filled 2024-01-26: qty 2

## 2024-01-26 MED ORDER — FENTANYL CITRATE (PF) 100 MCG/2ML IJ SOLN
INTRAMUSCULAR | Status: DC | PRN
  Administered 2024-01-26 (×2): 25 ug via INTRAVENOUS

## 2024-01-26 MED ORDER — SODIUM CHLORIDE 0.9 % IV SOLN
INTRAVENOUS | Status: AC
  Administered 2024-01-26: 80 mg
  Filled 2024-01-26: qty 2

## 2024-01-26 MED ORDER — LIDOCAINE HCL (PF) 1 % IJ SOLN
INTRAMUSCULAR | Status: DC | PRN
  Administered 2024-01-26: 40 mL

## 2024-01-26 MED ORDER — VANCOMYCIN HCL IN DEXTROSE 1-5 GM/200ML-% IV SOLN
INTRAVENOUS | Status: AC
  Administered 2024-01-26: 1000 mg via INTRAVENOUS
  Filled 2024-01-26: qty 200

## 2024-01-26 MED ORDER — SODIUM CHLORIDE 0.9 % IV SOLN: 80.0000 mg | INTRAVENOUS | Status: AC

## 2024-01-26 MED ORDER — MIDAZOLAM HCL 5 MG/5ML IJ SOLN
INTRAMUSCULAR | Status: DC | PRN
Start: 2024-01-26 — End: 2024-01-26
  Administered 2024-01-26 (×2): 1 mg via INTRAVENOUS

## 2024-01-26 MED ORDER — MIDAZOLAM HCL 2 MG/2ML IJ SOLN
INTRAMUSCULAR | Status: AC
  Filled 2024-01-26: qty 2

## 2024-01-26 SURGICAL SUPPLY — 10 items
CABLE SURGICAL S-101-97-12 (CABLE) ×1 IMPLANT
DEVICE DISSECT PLASMABLAD 3.0S (MISCELLANEOUS) IMPLANT
ELECT DEFIB PAD ADLT CADENCE (PAD) IMPLANT
IPG PACE AZUR XT DR MRI W1DR01 (Pacemaker) IMPLANT
KIT WRENCH (KITS) IMPLANT
PACE AZURE XT DR MRI W1DR01 (Pacemaker) ×1 IMPLANT
PLASMABLADE 3.0S (MISCELLANEOUS) ×1 IMPLANT
POUCH AIGIS-R ANTIBACT PPM (Mesh General) ×1 IMPLANT
POUCH AIGIS-R ANTIBACT PPM MED (Mesh General) IMPLANT
TRAY PACEMAKER INSERTION (PACKS) ×1 IMPLANT

## 2024-01-26 NOTE — Interval H&P Note (Signed)
 History and Physical Interval Note:  01/26/2024 10:42 AM  Judy Chapman  has presented today for surgery, with the diagnosis of ERi.  The various methods of treatment have been discussed with the patient and family. After consideration of risks, benefits and other options for treatment, the patient has consented to  Procedure(s): PPM GENERATOR CHANGEOUT (N/A) as a surgical intervention.  The patient's history has been reviewed, patient examined, no change in status, stable for surgery.  I have reviewed the patient's chart and labs.  Questions were answered to the patient's satisfaction.     Sherryl Manges  No changes  Questions answered

## 2024-01-26 NOTE — Discharge Instructions (Signed)

## 2024-01-27 MED FILL — Midazolam HCl Inj 2 MG/2ML (Base Equivalent): INTRAMUSCULAR | Qty: 2 | Status: AC

## 2024-02-02 DIAGNOSIS — R9389 Abnormal findings on diagnostic imaging of other specified body structures: Secondary | ICD-10-CM | POA: Diagnosis not present

## 2024-02-02 DIAGNOSIS — R5383 Other fatigue: Secondary | ICD-10-CM | POA: Diagnosis not present

## 2024-02-02 DIAGNOSIS — Z1152 Encounter for screening for COVID-19: Secondary | ICD-10-CM | POA: Diagnosis not present

## 2024-02-02 DIAGNOSIS — J029 Acute pharyngitis, unspecified: Secondary | ICD-10-CM | POA: Diagnosis not present

## 2024-02-02 DIAGNOSIS — R059 Cough, unspecified: Secondary | ICD-10-CM | POA: Diagnosis not present

## 2024-02-02 DIAGNOSIS — I10 Essential (primary) hypertension: Secondary | ICD-10-CM | POA: Diagnosis not present

## 2024-02-02 DIAGNOSIS — N390 Urinary tract infection, site not specified: Secondary | ICD-10-CM | POA: Diagnosis not present

## 2024-02-02 DIAGNOSIS — J0100 Acute maxillary sinusitis, unspecified: Secondary | ICD-10-CM | POA: Diagnosis not present

## 2024-02-02 DIAGNOSIS — R0981 Nasal congestion: Secondary | ICD-10-CM | POA: Diagnosis not present

## 2024-02-07 ENCOUNTER — Ambulatory Visit

## 2024-02-08 ENCOUNTER — Ambulatory Visit: Attending: Cardiology

## 2024-02-08 DIAGNOSIS — I495 Sick sinus syndrome: Secondary | ICD-10-CM | POA: Diagnosis not present

## 2024-02-08 LAB — CUP PACEART INCLINIC DEVICE CHECK
Battery Remaining Longevity: 162 mo
Battery Voltage: 3.23 V
Brady Statistic AP VP Percent: 0.72 %
Brady Statistic AP VS Percent: 96.97 %
Brady Statistic AS VP Percent: 0.01 %
Brady Statistic AS VS Percent: 2.31 %
Brady Statistic RA Percent Paced: 97.72 %
Brady Statistic RV Percent Paced: 0.72 %
Date Time Interrogation Session: 20250415163326
Implantable Lead Connection Status: 753985
Implantable Lead Connection Status: 753985
Implantable Lead Implant Date: 20020701
Implantable Lead Implant Date: 20020701
Implantable Lead Location: 753859
Implantable Lead Location: 753860
Implantable Lead Model: 4469
Implantable Lead Model: 4470
Implantable Lead Serial Number: 313853
Implantable Lead Serial Number: 323286
Implantable Pulse Generator Implant Date: 20250402
Lead Channel Impedance Value: 304 Ohm
Lead Channel Impedance Value: 380 Ohm
Lead Channel Impedance Value: 418 Ohm
Lead Channel Impedance Value: 437 Ohm
Lead Channel Pacing Threshold Amplitude: 0.75 V
Lead Channel Pacing Threshold Amplitude: 0.75 V
Lead Channel Pacing Threshold Pulse Width: 0.4 ms
Lead Channel Pacing Threshold Pulse Width: 0.4 ms
Lead Channel Sensing Intrinsic Amplitude: 1.25 mV
Lead Channel Sensing Intrinsic Amplitude: 1.625 mV
Lead Channel Sensing Intrinsic Amplitude: 8.625 mV
Lead Channel Sensing Intrinsic Amplitude: 9.25 mV
Lead Channel Setting Pacing Amplitude: 1.5 V
Lead Channel Setting Pacing Amplitude: 2.5 V
Lead Channel Setting Pacing Pulse Width: 0.4 ms
Lead Channel Setting Sensing Sensitivity: 1.2 mV
Zone Setting Status: 755011
Zone Setting Status: 755011

## 2024-02-08 NOTE — Patient Instructions (Signed)
   After Your Pacemaker   Monitor your pacemaker site for redness, swelling, and drainage. Call the device clinic at [phone] if you experience these symptoms or fever/chills.  Your incision was closed with Dermabond:  You may shower 1 day after your defibrillator implant and wash your incision with soap and water. Avoid lotions, ointments, or perfumes over your incision until it is well-healed.  You may use a hot tub or a pool after your wound check appointment if the incision is completely closed.  There are no restrictions in arm movement after your wound check appointment.  You may drive, unless driving has been restricted by your healthcare providers.  Remote monitoring is used to monitor your pacemaker from home. This monitoring is scheduled every 91 days by our office. It allows [place] to keep an eye on the functioning of your device to ensure it is working properly. You will routinely see your Electrophysiologist annually (more often if necessary).

## 2024-02-08 NOTE — Progress Notes (Signed)
 Normal dual chamber pacemaker wound check. Presenting rhythm: AP/VS- 69 . Wound well healed. Routine testing performed. Thresholds, sensing, and impedances consistent with previous measurements. Patient programmed at chronic outputs post gen change. No episodes.  Pt enrolled in remote follow-up.

## 2024-02-09 ENCOUNTER — Ambulatory Visit

## 2024-02-10 ENCOUNTER — Ambulatory Visit: Payer: Medicare PPO

## 2024-02-12 ENCOUNTER — Encounter: Payer: Self-pay | Admitting: Internal Medicine

## 2024-02-17 ENCOUNTER — Other Ambulatory Visit: Payer: Self-pay | Admitting: Hematology and Oncology

## 2024-02-17 DIAGNOSIS — Z1231 Encounter for screening mammogram for malignant neoplasm of breast: Secondary | ICD-10-CM

## 2024-02-18 ENCOUNTER — Encounter: Payer: Self-pay | Admitting: Gastroenterology

## 2024-02-25 ENCOUNTER — Ambulatory Visit (AMBULATORY_SURGERY_CENTER): Admitting: Gastroenterology

## 2024-02-25 ENCOUNTER — Encounter: Payer: Self-pay | Admitting: Gastroenterology

## 2024-02-25 VITALS — BP 120/56 | HR 61 | Temp 98.1°F | Resp 20 | Ht 64.0 in | Wt 170.0 lb

## 2024-02-25 DIAGNOSIS — D120 Benign neoplasm of cecum: Secondary | ICD-10-CM

## 2024-02-25 DIAGNOSIS — K635 Polyp of colon: Secondary | ICD-10-CM

## 2024-02-25 DIAGNOSIS — K5730 Diverticulosis of large intestine without perforation or abscess without bleeding: Secondary | ICD-10-CM | POA: Diagnosis not present

## 2024-02-25 DIAGNOSIS — I10 Essential (primary) hypertension: Secondary | ICD-10-CM | POA: Diagnosis not present

## 2024-02-25 DIAGNOSIS — D123 Benign neoplasm of transverse colon: Secondary | ICD-10-CM

## 2024-02-25 DIAGNOSIS — Z1211 Encounter for screening for malignant neoplasm of colon: Secondary | ICD-10-CM | POA: Diagnosis not present

## 2024-02-25 DIAGNOSIS — K648 Other hemorrhoids: Secondary | ICD-10-CM

## 2024-02-25 DIAGNOSIS — I493 Ventricular premature depolarization: Secondary | ICD-10-CM | POA: Diagnosis not present

## 2024-02-25 DIAGNOSIS — R195 Other fecal abnormalities: Secondary | ICD-10-CM | POA: Diagnosis not present

## 2024-02-25 MED ORDER — SODIUM CHLORIDE 0.9 % IV SOLN
500.0000 mL | Freq: Once | INTRAVENOUS | Status: DC
Start: 2024-02-25 — End: 2024-02-25

## 2024-02-25 NOTE — Progress Notes (Signed)
 Called to room to assist during endoscopic procedure.  Patient ID and intended procedure confirmed with present staff. Received instructions for my participation in the procedure from the performing physician.

## 2024-02-25 NOTE — Op Note (Signed)
 [facility] Patient Name: Judy Chapman Procedure Date: 02/25/2024 10:11 AM MRN: [id number] Endoscopist: Landon Pinion P. General Kenner , MD, [id number] Age: 78 Referring MD:  Date of Birth: 1946-06-07 Gender: Female Account #: [id number] Procedure:                Colonoscopy Indications:              Positive Cologuard test, negative colonoscopy in                            2014 Medicines:                Monitored Anesthesia Care Procedure:                Pre-Anesthesia Assessment:                           - Prior to the procedure, a History and Physical                            was performed, and patient medications and                            allergies were reviewed. The patient's tolerance of                            previous anesthesia was also reviewed. The risks                            and benefits of the procedure and the sedation                            options and risks were discussed with the patient.                            All questions were answered, and informed consent                            was obtained. Prior Anticoagulants: The patient has                            taken no anticoagulant or antiplatelet agents. ASA                            Grade Assessment: III - A patient with severe                            systemic disease. After reviewing the risks and                            benefits, the patient was deemed in satisfactory                            condition to undergo the procedure.  After obtaining informed consent, the colonoscope                            was passed under direct vision. Throughout the                            procedure, the patient's blood pressure, pulse, and                            oxygen saturations were monitored continuously. The                            Olympus Scope SN: [id number] was introduced through                            the anus and advanced to the the terminal  ileum,                            with identification of the appendiceal orifice and                            IC valve. The colonoscopy was performed without                            difficulty. The patient tolerated the procedure                            well. The quality of the bowel preparation was                            adequate. The terminal ileum, ileocecal valve,                            appendiceal orifice, and rectum were photographed. Scope In: 10:24:02 AM Scope Out: 10:48:42 AM Scope Withdrawal Time: 0 hours 20 minutes 23 seconds  Total Procedure Duration: 0 hours 24 minutes 40 seconds  Findings:                 The perianal and digital rectal examinations were                            normal.                           The terminal ileum appeared normal.                           A 3 mm polyp was found in the cecum. The polyp was                            sessile. The polyp was removed with a cold snare.                            Resection and retrieval were complete.  Three flat and sessile polyps were found in the                            transverse colon. The polyps were 2 to 3 mm in                            size. These polyps were removed with a cold snare.                            Resection and retrieval were complete.                           A few small-mouthed diverticula were found in the                            sigmoid colon and transverse colon.                           Internal hemorrhoids were found during retroflexion.                           The colon was tortuous.                           The exam was otherwise without abnormality. Complications:            Estimated blood loss: Minimal. Of note, patient had                            an episode of vomiting near the end of exam (during                            withdrawal in the sigmoid colon), suctioned per                            anesthesia, no overt  aspiration noted Estimated Blood Loss:     Estimated blood loss was minimal. Impression:               - The examined portion of the ileum was normal.                           - One 3 mm polyp in the cecum, removed with a cold                            snare. Resected and retrieved.                           - Three 2 to 3 mm polyps in the transverse colon,                            removed with a cold snare. Resected and retrieved.                           -  Diverticulosis in the sigmoid colon and in the                            transverse colon.                           - Internal hemorrhoids.                           - Tortuous colon.                           - The examination was otherwise normal. Recommendation:           - Patient has a contact number available for                            emergencies. The signs and symptoms of potential                            delayed complications were discussed with the                            patient. Return to normal activities tomorrow.                            Written discharge instructions were provided to the                            patient.                           - Resume previous diet.                           - Continue present medications.                           - Await pathology results. No further colonoscopy                            or stool tests are recommended given no high risk                            lesions on this exam and the patient's age Judy Chapman. Judy Vandekamp, MD 02/25/2024 10:57:34 AM This report has been signed electronically.

## 2024-02-25 NOTE — Progress Notes (Signed)
 To pacu, VSS. Report to [email or web address]

## 2024-02-25 NOTE — Progress Notes (Signed)
 Judy Chapman History and Physical   Primary Care Physician:  Judy Hun, MD   Reason for Procedure:   Positive Cologuard  Plan:    colonoscopy     HPI: Judy Chapman is a 78 y.o. female  here for colonoscopy to evaluate positive Cologuard - done in January.   Patient denies any bowel symptoms at this time other than mild constipation since she had a knee replacement. Otherwise feels well without any cardiopulmonary symptoms.   I have discussed risks / benefits of anesthesia and endoscopic procedure with Judy Chapman and they wish to proceed with the exams as outlined today.    Past Medical History:  Diagnosis Date   ACE inhibitor intolerance    Arthritis    Asthma    outgrew as a child   Breast cancer (HCC)    Cancer (HCC)    right breast   Dysrhythmia    SSS   Early cataract    Endometriosis    GERD (gastroesophageal reflux disease)    Gout    Heart disease    Hypertension    Interstitial lung disease (HCC)    Osteopenia    Pacemaker-MDT    DOI-2002, Generator change 2010   Pneumonia    PVC (premature ventricular contraction)    Sinus node dysfunction (HCC)    Wears glasses     Past Surgical History:  Procedure Laterality Date   BREAST LUMPECTOMY WITH RADIOACTIVE SEED LOCALIZATION Right 03/27/2020   Procedure: RIGHT BREAST LUMPECTOMY WITH RADIOACTIVE SEED LOCALIZATION;  Surgeon: Caralyn Chandler, MD;  Location: [facility] OR;  Service: General;  Laterality: Right;   CORONARY ANGIOPLASTY     DILATION AND CURETTAGE OF UTERUS     EYE SURGERY Bilateral    cataract extraction w/ IOL   GALLBLADDER SURGERY     HYSTEROSCOPY     PACEMAKER INSERTION     [employer]onic   PPM GENERATOR CHANGEOUT N/A 01/26/2024   Procedure: PPM GENERATOR CHANGEOUT;  Surgeon: Verona Goodwill, MD;  Location: [facility] INVASIVE CV LAB;  Service: Cardiovascular;  Laterality: N/A;   TONSILLECTOMY     TOTAL KNEE ARTHROPLASTY Left 11/08/2023   Procedure: TOTAL KNEE ARTHROPLASTY;   Surgeon: Liliane Rei, MD;  Location: [facility] ORS;  Service: Orthopedics;  Laterality: Left;   TUBAL LIGATION     VAGINAL HYSTERECTOMY     WISDOM TOOTH EXTRACTION      Prior to Admission medications   Medication Sig Start Date End Date Taking? Authorizing Provider  allopurinol  (ZYLOPRIM ) 100 MG tablet Take 200 mg by mouth daily. 10/11/19  Yes [provider]  b complex vitamins capsule Take 1 capsule by mouth daily.   Yes [provider]  Cholecalciferol (VITAMIN D PO) Take 2,000 Units by mouth daily.    Yes [provider]  citalopram  (CELEXA ) 20 MG tablet Take 1 tablet (20 mg total) by mouth daily. 03/26/11  Yes Ezra Holmes, MD  loratadine  (CLARITIN ) 10 MG tablet Take 10 mg by mouth daily.   Yes [provider]  MAGNESIUM PO Take 1 tablet by mouth daily.   Yes [provider]  methocarbamol  (ROBAXIN ) 500 MG tablet Take 1 tablet (500 mg total) by mouth every 6 (six) hours as needed for muscle spasms. 11/09/23  Yes Perla Bradford, PA  nebivolol  (BYSTOLIC ) 10 MG tablet Take 10 mg by mouth daily.   Yes [provider]  omeprazole (PRILOSEC) 20 MG capsule Take 20 mg by mouth daily.   Yes [provider]  tamoxifen  (NOLVADEX ) 10 MG tablet Take 1 tablet (10 mg total) by mouth daily. 09/07/23  Yes Gudena, Vinay, MD  celecoxib  (CELEBREX ) 200 MG capsule Take 200 mg by mouth daily.    [provider]    Current Outpatient Medications  Medication Sig Dispense Refill   allopurinol  (ZYLOPRIM ) 100 MG tablet Take 200 mg by mouth daily.     b complex vitamins capsule Take 1 capsule by mouth daily.     Cholecalciferol (VITAMIN D PO) Take 2,000 Units by mouth daily.      citalopram  (CELEXA ) 20 MG tablet Take 1 tablet (20 mg total) by mouth daily. 90 tablet 2   loratadine  (CLARITIN ) 10 MG tablet Take 10 mg by mouth daily.     MAGNESIUM PO Take 1 tablet by mouth daily.     methocarbamol  (ROBAXIN ) 500 MG tablet Take 1 tablet (500 mg  total) by mouth every 6 (six) hours as needed for muscle spasms. 40 tablet 0   nebivolol  (BYSTOLIC ) 10 MG tablet Take 10 mg by mouth daily.     omeprazole (PRILOSEC) 20 MG capsule Take 20 mg by mouth daily.     tamoxifen  (NOLVADEX ) 10 MG tablet Take 1 tablet (10 mg total) by mouth daily. 90 tablet 3   celecoxib  (CELEBREX ) 200 MG capsule Take 200 mg by mouth daily.     Current Facility-Administered Medications  Medication Dose Route Frequency Provider Last Rate Last Admin   0.9 %  sodium chloride  infusion  500 mL Intravenous Once Ace Holder, MD        Allergies as of 02/25/2024 - Review Complete 02/25/2024  Allergen Reaction Noted   Augmentin [amoxicillin-pot clavulanate] Diarrhea 02/11/2012   Nitrofurantoin Other (See Comments) 03/20/2020   Ramipril  Hives and Swelling 09/05/2012   Thiazide-type diuretics Other (See Comments) 06/18/2021   Uloric [febuxostat] Other (See Comments) 06/18/2021   Anastrozole  Other (See Comments) 06/18/2021   Molnupiravir Rash 06/18/2021   Pravastatin Other (See Comments) 06/18/2021    Family History  Problem Relation Age of Onset   Hypertension Mother    Dupuytren's contracture Father    Stomach cancer Paternal Grandfather 24   Arrhythmia Neg Hx    Clotting disorder Neg Hx    Heart attack Neg Hx    Fainting Neg Hx    Anemia Neg Hx    Heart disease Neg Hx    Heart failure Neg Hx    Hyperlipidemia Neg Hx    Colon cancer Neg Hx    Rectal cancer Neg Hx     Social History   Socioeconomic History   Marital status: Married    Spouse name: Not on file   Number of children: Not on file   Years of education: Not on file   Highest education level: Not on file  Occupational History   Not on file  Tobacco Use   Smoking status: Former    Current packs/day: 0.00    Average packs/day: 0.3 packs/day for 10.0 years (3.0 ttl pk-yrs)    Types: Cigarettes    Start date: 10/27/1995    Quit date: 10/26/2005    Years since quitting: 18.3    Passive  exposure: Never   Smokeless tobacco: Never  Vaping Use   Vaping status: Never Used  Substance and Sexual Activity   Alcohol  use: Yes    Alcohol /week: 12.0 standard drinks of alcohol     Types: 12 Glasses of wine per week    Comment: 2 glasses of wine daily   Drug  use: No   Sexual activity: Yes    Birth control/protection: Surgical    Comment: INTERCOURSE AGE 21 , LESS THAN 5 SEXUAL PARTNERS  Other Topics Concern   Not on file  Social History Narrative   Not on file   Social Drivers of Health   Financial Resource Strain: Not on file  Food Insecurity: No Food Insecurity (11/08/2023)   Hunger Vital Sign    Worried About Running Out of Food in the Last Year: Never true    Ran Out of Food in the Last Year: Never true  Transportation Needs: No Transportation Needs (11/08/2023)   PRAPARE - Administrator, Civil Service (Medical): No    Lack of Transportation (Non-Medical): No  Physical Activity: Not on file  Stress: Not on file  Social Connections: Socially Integrated (11/08/2023)   Social Connection and Isolation Panel [NHANES]    Frequency of Communication with Friends and Family: Twice a week    Frequency of Social Gatherings with Friends and Family: Twice a week    Attends Religious Services: 1 to 4 times per year    Active Member of [employer] or Organizations: Yes    Attends Engineer, structural: More than 4 times per year    Marital Status: Married  Catering manager Violence: Not At Risk (11/08/2023)   Humiliation, Afraid, Rape, and Kick questionnaire    Fear of Current or Ex-Partner: No    Emotionally Abused: No    Physically Abused: No    Sexually Abused: No    Review of Systems: All other review of systems negative except as mentioned in the HPI.  Physical Exam: Vital signs BP 104/60   Pulse 70   Temp 98.1 F (36.7 C)   Ht 5\' 4"  (1.626 m)   Wt 170 lb (77.1 kg)   SpO2 95%   BMI 29.18 kg/m   General:   Alert,  Well-developed, pleasant and  cooperative in NAD Lungs:  Clear throughout to auscultation.   Heart:  Regular rate and rhythm Abdomen:  Soft, nontender and nondistended.   Neuro/Psych:  Alert and cooperative. Normal mood and affect. A and O x 3  Christi Coward, MD [facility]

## 2024-02-25 NOTE — Patient Instructions (Signed)
 Resume previous diet Continue present medications Await pathology results. No further colonoscopy or stool tests are recommended given low risk findings See handouts for polyps, diverticulosis and hemorrhoids YOU HAD AN ENDOSCOPIC PROCEDURE TODAY AT THE [facility]:   Refer to the procedure report that was given to you for any specific questions about what was found during the examination.  If the procedure report does not answer your questions, please call your gastroenterologist to clarify.  If you requested that your care partner not be given the details of your procedure findings, then the procedure report has been included in a sealed envelope for you to review at your convenience later.  YOU SHOULD EXPECT: Some feelings of bloating in the abdomen. Passage of more gas than usual.  Walking can help get rid of the air that was put into your GI tract during the procedure and reduce the bloating. If you had a lower endoscopy (such as a colonoscopy or flexible sigmoidoscopy) you may notice spotting of blood in your stool or on the toilet paper. If you underwent a bowel prep for your procedure, you may not have a normal bowel movement for a few days.  Please Note:  You might notice some irritation and congestion in your nose or some drainage.  This is from the oxygen used during your procedure.  There is no need for concern and it should clear up in a day or so.  SYMPTOMS TO REPORT IMMEDIATELY:  Following lower endoscopy (colonoscopy or flexible sigmoidoscopy):  Excessive amounts of blood in the stool  Significant tenderness or worsening of abdominal pains  Swelling of the abdomen that is new, acute  Fever of 100F or higher  For urgent or emergent issues, a gastroenterologist can be reached at any hour by calling [phone]. Do not use MyChart messaging for urgent concerns.   DIET:  We do recommend a small meal at first, but then you may proceed to your regular diet.  Drink  plenty of fluids but you should avoid alcoholic beverages for 24 hours.  ACTIVITY:  You should plan to take it easy for the rest of today and you should NOT DRIVE or use heavy machinery until tomorrow (because of the sedation medicines used during the test).    FOLLOW UP: Our staff will call the number listed on your records the next business day following your procedure.  We will call around 7:15- 8:00 am to check on you and address any questions or concerns that you may have regarding the information given to you following your procedure. If we do not reach you, we will leave a message.     If any biopsies were taken you will be contacted by phone or by letter within the next 1-3 weeks.  Please call us  at [phone] if you have not heard about the biopsies in 3 weeks.   SIGNATURES/CONFIDENTIALITY: You and/or your care partner have signed paperwork which will be entered into your electronic medical record.  These signatures attest to the fact that that the information above on your After Visit Summary has been reviewed and is understood.  Full responsibility of the confidentiality of this discharge information lies with you and/or your care-partner.

## 2024-02-28 ENCOUNTER — Telehealth: Payer: Self-pay

## 2024-02-28 NOTE — Telephone Encounter (Signed)
 Left message

## 2024-02-29 DIAGNOSIS — I10 Essential (primary) hypertension: Secondary | ICD-10-CM | POA: Diagnosis not present

## 2024-02-29 DIAGNOSIS — R059 Cough, unspecified: Secondary | ICD-10-CM | POA: Diagnosis not present

## 2024-02-29 DIAGNOSIS — R5383 Other fatigue: Secondary | ICD-10-CM | POA: Diagnosis not present

## 2024-02-29 DIAGNOSIS — J0100 Acute maxillary sinusitis, unspecified: Secondary | ICD-10-CM | POA: Diagnosis not present

## 2024-02-29 DIAGNOSIS — R0981 Nasal congestion: Secondary | ICD-10-CM | POA: Diagnosis not present

## 2024-02-29 DIAGNOSIS — R9389 Abnormal findings on diagnostic imaging of other specified body structures: Secondary | ICD-10-CM | POA: Diagnosis not present

## 2024-02-29 DIAGNOSIS — J189 Pneumonia, unspecified organism: Secondary | ICD-10-CM | POA: Diagnosis not present

## 2024-02-29 DIAGNOSIS — Z1152 Encounter for screening for COVID-19: Secondary | ICD-10-CM | POA: Diagnosis not present

## 2024-03-01 LAB — SURGICAL PATHOLOGY

## 2024-03-02 ENCOUNTER — Encounter: Payer: Self-pay | Admitting: Gastroenterology

## 2024-03-07 NOTE — Addendum Note (Signed)
 Addended by: Edra Govern D on: 03/07/2024 04:02 PM   Modules accepted: Orders, Level of Service

## 2024-03-07 NOTE — Progress Notes (Signed)
 Remote pacemaker transmission.

## 2024-03-13 ENCOUNTER — Ambulatory Visit (INDEPENDENT_AMBULATORY_CARE_PROVIDER_SITE_OTHER)

## 2024-03-13 DIAGNOSIS — I495 Sick sinus syndrome: Secondary | ICD-10-CM

## 2024-03-15 LAB — CUP PACEART REMOTE DEVICE CHECK
Battery Remaining Longevity: 161 mo
Battery Voltage: 3.22 V
Brady Statistic AP VP Percent: 0.34 %
Brady Statistic AP VS Percent: 95.87 %
Brady Statistic AS VP Percent: 0 %
Brady Statistic AS VS Percent: 3.79 %
Brady Statistic RA Percent Paced: 96.29 %
Brady Statistic RV Percent Paced: 0.34 %
Date Time Interrogation Session: 20250521125137
Implantable Lead Connection Status: 753985
Implantable Lead Connection Status: 753985
Implantable Lead Implant Date: 20020701
Implantable Lead Implant Date: 20020701
Implantable Lead Location: 753859
Implantable Lead Location: 753860
Implantable Lead Model: 4469
Implantable Lead Model: 4470
Implantable Lead Serial Number: 313853
Implantable Lead Serial Number: 323286
Implantable Pulse Generator Implant Date: 20250402
Lead Channel Impedance Value: 304 Ohm
Lead Channel Impedance Value: 361 Ohm
Lead Channel Impedance Value: 399 Ohm
Lead Channel Impedance Value: 418 Ohm
Lead Channel Pacing Threshold Amplitude: 0.75 V
Lead Channel Pacing Threshold Amplitude: 0.75 V
Lead Channel Pacing Threshold Pulse Width: 0.4 ms
Lead Channel Pacing Threshold Pulse Width: 0.4 ms
Lead Channel Sensing Intrinsic Amplitude: 1 mV
Lead Channel Sensing Intrinsic Amplitude: 1 mV
Lead Channel Sensing Intrinsic Amplitude: 6.625 mV
Lead Channel Sensing Intrinsic Amplitude: 6.625 mV
Lead Channel Setting Pacing Amplitude: 1.5 V
Lead Channel Setting Pacing Amplitude: 2.5 V
Lead Channel Setting Pacing Pulse Width: 0.4 ms
Lead Channel Setting Sensing Sensitivity: 1.2 mV
Zone Setting Status: 755011
Zone Setting Status: 755011

## 2024-03-19 ENCOUNTER — Ambulatory Visit: Payer: Self-pay | Admitting: Cardiology

## 2024-03-21 DIAGNOSIS — R9389 Abnormal findings on diagnostic imaging of other specified body structures: Secondary | ICD-10-CM | POA: Diagnosis not present

## 2024-03-21 DIAGNOSIS — J189 Pneumonia, unspecified organism: Secondary | ICD-10-CM | POA: Diagnosis not present

## 2024-03-21 DIAGNOSIS — I10 Essential (primary) hypertension: Secondary | ICD-10-CM | POA: Diagnosis not present

## 2024-04-04 ENCOUNTER — Ambulatory Visit
Admission: RE | Admit: 2024-04-04 | Discharge: 2024-04-04 | Disposition: A | Discharge status: Home / Self Care | Source: Ambulatory Visit | Attending: Hematology and Oncology | Admitting: Hematology and Oncology

## 2024-04-04 DIAGNOSIS — Z1231 Encounter for screening mammogram for malignant neoplasm of breast: Secondary | ICD-10-CM | POA: Diagnosis not present

## 2024-04-11 DIAGNOSIS — M7062 Trochanteric bursitis, left hip: Secondary | ICD-10-CM | POA: Diagnosis not present

## 2024-04-11 DIAGNOSIS — Z950 Presence of cardiac pacemaker: Secondary | ICD-10-CM | POA: Diagnosis not present

## 2024-04-11 DIAGNOSIS — I10 Essential (primary) hypertension: Secondary | ICD-10-CM | POA: Diagnosis not present

## 2024-04-11 DIAGNOSIS — M1990 Unspecified osteoarthritis, unspecified site: Secondary | ICD-10-CM | POA: Diagnosis not present

## 2024-04-11 DIAGNOSIS — M8580 Other specified disorders of bone density and structure, unspecified site: Secondary | ICD-10-CM | POA: Diagnosis not present

## 2024-04-11 DIAGNOSIS — D7589 Other specified diseases of blood and blood-forming organs: Secondary | ICD-10-CM | POA: Diagnosis not present

## 2024-04-11 DIAGNOSIS — J479 Bronchiectasis, uncomplicated: Secondary | ICD-10-CM | POA: Diagnosis not present

## 2024-04-11 DIAGNOSIS — E785 Hyperlipidemia, unspecified: Secondary | ICD-10-CM | POA: Diagnosis not present

## 2024-04-11 DIAGNOSIS — D649 Anemia, unspecified: Secondary | ICD-10-CM | POA: Diagnosis not present

## 2024-04-12 DIAGNOSIS — D643 Other sideroblastic anemias: Secondary | ICD-10-CM | POA: Diagnosis not present

## 2024-04-25 NOTE — Progress Notes (Signed)
 Cardiology Office Note:  .   Date:  04/25/2024  ID:  Judy Chapman, Judy Chapman 29-Sep-1946, MRN [id number] PCP: Shayne Anes, MD  [facility] Providers Cardiologist:  None Electrophysiologist:  Elspeth Sage, MD >> Dr. Kennyth   History of Present Illness: .   Judy Chapman is a 78 y.o. female w/PMHx of  Breast Ca (treated surgically/and hormonal tx) HTN, gout Sinus arrest s/p PPM  She saw Dr. Sage 06/30/23, knee trouble limited her. He noted SCAF Aslo reported non-cardiac dyspnea, followed by pulm  She saw Jodie 12/31/23, at that time, recently had a + cologuard, so is awaiting GI follow up, no cardiac concerns She was ERI reverted to VVI 65, planned for gen change (No atrial data available)  Gen change on 01/26/24  Wound check 02/08/24, well healed, no episodes, chronic outputs noted  Today's visit is scheduled as her 90 post gen change visit  ROS:   Feeling a little tired of late, her PMD found her anemic, with w/u into that underway Did have her colonoscopy done with reports of precancerous polyps removed Also had a knee replacement.  Cardiac-wise No CP, palpitations or cardiac awareness No SOB, DOE< he knee limits her some, but does well No dizzy spells, near syncope or syncope   Device information MDT dual chamber PPM implanted 04/25/2001, gen change 2010, and 01/26/24  Arrhythmia/AAD hx SCAF noted via device  Studies Reviewed: SABRA    EKG not done today  DEVICE interrogation done today and reviewed by myself Battery and lead measurements are good No arrhythmias  04/03/22: TTE 1. Left ventricular ejection fraction, by estimation, is 55 to 60%. The  left ventricle has normal function. The left ventricle has no regional  wall motion abnormalities. There is mild left ventricular hypertrophy.  Left ventricular diastolic parameters  are consistent with Grade I diastolic dysfunction (impaired relaxation).   2. Right ventricular systolic function is normal. The  right ventricular  size is normal. Tricuspid regurgitation signal is inadequate for assessing  PA pressure.   3. The mitral valve is grossly normal. Mild to moderate mitral valve  regurgitation. No evidence of mitral stenosis.   4. Tricuspid valve regurgitation is mild to moderate.   5. The aortic valve is tricuspid. Aortic valve regurgitation is moderate,  PHT 392 ms, vena contracta 0.46 cm. Central jet of AI. No aortic stenosis  is present.   6. The inferior vena cava is normal in size with greater than 50%  respiratory variability, suggesting right atrial pressure of 3 mmHg.     Risk Assessment/Calculations:    Physical Exam:   VS:  There were no vitals taken for this visit.   Wt Readings from Last 3 Encounters:  02/25/24 170 lb (77.1 kg)  01/26/24 170 lb (77.1 kg)  01/24/24 170 lb (77.1 kg)    GEN: Well nourished, well developed in no acute distress NECK: No JVD; No carotid bruits CARDIAC: RRR, no murmurs, rubs, gallops RESPIRATORY:  CTA b/l without rales, wheezing or rhonchi  ABDOMEN: Soft, non-tender, non-distended EXTREMITIES: No edema; No deformity   PPM site: is stable, no thinning, fluctuation, tethering  ASSESSMENT AND PLAN: .    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 Kennyth being her EP MD,  he cares for her husband, they both  like him very much    Signed, Charlies Macario Arthur, PA-C

## 2024-05-01 ENCOUNTER — Encounter: Payer: Self-pay | Admitting: Physician Assistant

## 2024-05-01 ENCOUNTER — Ambulatory Visit: Attending: Physician Assistant | Admitting: Physician Assistant

## 2024-05-01 ENCOUNTER — Ambulatory Visit: Payer: Self-pay | Admitting: Cardiology

## 2024-05-01 VITALS — BP 118/76 | HR 65 | Ht 64.0 in | Wt 159.8 lb

## 2024-05-01 DIAGNOSIS — I480 Paroxysmal atrial fibrillation: Secondary | ICD-10-CM | POA: Diagnosis not present

## 2024-05-01 DIAGNOSIS — I10 Essential (primary) hypertension: Secondary | ICD-10-CM | POA: Diagnosis not present

## 2024-05-01 DIAGNOSIS — Z950 Presence of cardiac pacemaker: Secondary | ICD-10-CM

## 2024-05-01 LAB — CUP PACEART INCLINIC DEVICE CHECK
Battery Remaining Longevity: 160 mo
Battery Voltage: 3.21 V
Brady Statistic AP VP Percent: 0.21 %
Brady Statistic AP VS Percent: 97.03 %
Brady Statistic AS VP Percent: 0 %
Brady Statistic AS VS Percent: 2.76 %
Brady Statistic RA Percent Paced: 97.3 %
Brady Statistic RV Percent Paced: 0.21 %
Date Time Interrogation Session: 20250707110947
Implantable Lead Connection Status: 753985
Implantable Lead Connection Status: 753985
Implantable Lead Implant Date: 20020701
Implantable Lead Implant Date: 20020701
Implantable Lead Location: 753859
Implantable Lead Location: 753860
Implantable Lead Model: 4469
Implantable Lead Model: 4470
Implantable Lead Serial Number: 313853
Implantable Lead Serial Number: 323286
Implantable Pulse Generator Implant Date: 20250402
Lead Channel Impedance Value: 304 Ohm
Lead Channel Impedance Value: 380 Ohm
Lead Channel Impedance Value: 418 Ohm
Lead Channel Impedance Value: 437 Ohm
Lead Channel Pacing Threshold Amplitude: 0.75 V
Lead Channel Pacing Threshold Amplitude: 0.875 V
Lead Channel Pacing Threshold Pulse Width: 0.4 ms
Lead Channel Pacing Threshold Pulse Width: 0.4 ms
Lead Channel Sensing Intrinsic Amplitude: 1 mV
Lead Channel Sensing Intrinsic Amplitude: 1.75 mV
Lead Channel Sensing Intrinsic Amplitude: 5.75 mV
Lead Channel Sensing Intrinsic Amplitude: 6.375 mV
Lead Channel Setting Pacing Amplitude: 1.5 V
Lead Channel Setting Pacing Amplitude: 2.5 V
Lead Channel Setting Pacing Pulse Width: 0.4 ms
Lead Channel Setting Sensing Sensitivity: 1.2 mV
Zone Setting Status: 755011
Zone Setting Status: 755011

## 2024-05-01 NOTE — Patient Instructions (Signed)
 Medication Instructions:   Your physician recommends that you continue on your current medications as directed. Please refer to the Current Medication list given to you today.  *If you need a refill on your cardiac medications before your next appointment, please call your pharmacy*  Lab Work: NONE ORDERED  TODAY    If you have labs (blood work) drawn today and your tests are completely normal, you will receive your results only by: MyChart Message (if you have MyChart) OR A paper copy in the mail If you have any lab test that is abnormal or we need to change your treatment, we will call you to review the results.  Testing/Procedures:  NONE ORDERED  TODAY    Follow-Up: At [facility], you and your health needs are our priority.  As part of our continuing mission to provide you with exceptional heart care, our providers are all part of one team.  This team includes your primary Cardiologist (physician) and Advanced Practice Providers or APPs (Physician Assistants and Nurse Practitioners) who all work together to provide you with the care you need, when you need it.  Your next appointment:   1 year(s)  Provider:   Fonda Kitty, MD     We recommend signing up for the patient portal called MyChart.  Sign up information is provided on this After Visit Summary.  MyChart is used to connect with patients for Virtual Visits (Telemedicine).  Patients are able to view lab/test results, encounter notes, upcoming appointments, etc.  Non-urgent messages can be sent to your provider as well.   To learn more about what you can do with MyChart, go to [email or web address].   Other Instructions

## 2024-05-03 DIAGNOSIS — Z961 Presence of intraocular lens: Secondary | ICD-10-CM | POA: Diagnosis not present

## 2024-05-03 DIAGNOSIS — H26493 Other secondary cataract, bilateral: Secondary | ICD-10-CM | POA: Diagnosis not present

## 2024-05-03 DIAGNOSIS — Z9842 Cataract extraction status, left eye: Secondary | ICD-10-CM | POA: Diagnosis not present

## 2024-05-03 DIAGNOSIS — Z9841 Cataract extraction status, right eye: Secondary | ICD-10-CM | POA: Diagnosis not present

## 2024-05-05 NOTE — Addendum Note (Signed)
 Addended by: TAWNI DRILLING D on: 05/05/2024 11:41 AM   Modules accepted: Orders

## 2024-05-05 NOTE — Progress Notes (Signed)
 Remote pacemaker transmission.

## 2024-05-24 DIAGNOSIS — M1711 Unilateral primary osteoarthritis, right knee: Secondary | ICD-10-CM | POA: Diagnosis not present

## 2024-05-30 DIAGNOSIS — D649 Anemia, unspecified: Secondary | ICD-10-CM | POA: Diagnosis not present

## 2024-05-30 DIAGNOSIS — I10 Essential (primary) hypertension: Secondary | ICD-10-CM | POA: Diagnosis not present

## 2024-05-31 ENCOUNTER — Encounter: Payer: Self-pay | Admitting: Emergency Medicine

## 2024-05-31 ENCOUNTER — Ambulatory Visit: Admitting: Emergency Medicine

## 2024-05-31 VITALS — BP 108/68 | HR 81 | Temp 98.0°F | Ht 64.0 in | Wt 161.0 lb

## 2024-05-31 DIAGNOSIS — J849 Interstitial pulmonary disease, unspecified: Secondary | ICD-10-CM | POA: Diagnosis not present

## 2024-05-31 NOTE — Patient Instructions (Signed)
 I am glad that you are doing well. We will follow-up in 1 year talk about how your breathing is doing.  At that time we will decide whether you need pulmonary function testing and or a repeat CT scan of your chest.  We will hold off on those test for now.  Please let us  know if your breathing changes in any way.  If so we may decide to perform testing at that time.

## 2024-05-31 NOTE — Progress Notes (Signed)
 Subjective:    Patient ID: Judy Chapman, female    DOB: 03-11-1946, 78 y.o.   MRN: [id number]  HPI  ROV 06/23/22 --follow-up visit for 78 year old woman with history of childhood asthma.  I saw her a month ago in the setting of some progressive shortness of breath.  She had had a reassuring cardiology evaluation, noted her pacemaker was working appropriately.  Chest x-ray showed a possible interstitial prominence.  She described her symptoms as cough, upper airway irritation, globus sensation.  She had been started empirically on Breo, I stopped it to see if there would be an improvement in her throat symptoms.  I also performed a CT scan of the chest to evaluate her interstitial prominence as below. She has not missed the [facility], feels that her throat sx and SOB are both better. She had COVID x 1, over a year ago.   High-resolution CT scan of the chest 06/15/2022 reviewed by me, shows very mild peripheral predominant groundglass attenuation and septal thickening with some subpleural reticulation and mild cylindrical bronchiectatic change.  No frank honeycomb.  Suggestive of early UIP pattern   ROV 06/18/2023 --Judy Chapman is 66 with a history of childhood asthma.  She was having some progressive exertional dyspnea which prompted us  to ultimately perform a high-resolution CT chest.  There was some very mild peripheral predominant groundglass attenuation and septal thickening without frank honeycomb change, question early UIP pattern.  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  Based on this I asked her to see rheumatology.  We talked about possible PFTs but decided to defer. Currently off Breo.  She does have some intermittent upper airway irritation and cough.  She is about to restart loratadine .  Repeat high-resolution CT chest as below. She has recovered some function, breathing is better.   CT scan of the chest high-resolution 06/11/2023 reviewed by me, shows overall  stability with some very subtle groundglass and septal thickening peripherally, especially at the bases.  May be slightly improved compared with 06/15/2022.  ROV 05/31/2024 --78 year old woman with history of childhood asthma, exertional shortness of breath.  She has some mild interstitial disease on high-resolution CT scan of the chest, question early UIP pattern.  Autoimmune panel reassuring with the exception of RF 32.  Rheumatology evaluation was reassuring.  She has some upper airway irritation and cough.  She reports today that she had flaring allergies and fatigue in April - was treated for CAP after CXR. She was also foubd to be anemic, which was likely contributing to her fatigue.  She has some throat hoarseness, denies any current PND. She is active, breathing is ok. No current cough. Better energy.    Review of Systems As per HPI  Past Medical History:  Diagnosis Date   ACE inhibitor intolerance    Arthritis    Asthma    outgrew as a child   Breast cancer (HCC)    Cancer (HCC)    right breast   Dysrhythmia    SSS   Early cataract    Endometriosis    GERD (gastroesophageal reflux disease)    Gout    Heart disease    Hypertension    Interstitial lung disease (HCC)    Osteopenia    Pacemaker-MDT    DOI-2002, Generator change 2010   Pneumonia    PVC (premature ventricular contraction)    Sinus node dysfunction (HCC)    Wears glasses      Family History  Problem  Relation Age of Onset   Hypertension Mother    Dupuytren's contracture Father    Stomach cancer Paternal Grandfather 47   Arrhythmia Neg Hx    Clotting disorder Neg Hx    Heart attack Neg Hx    Fainting Neg Hx    Anemia Neg Hx    Heart disease Neg Hx    Heart failure Neg Hx    Hyperlipidemia Neg Hx    Colon cancer Neg Hx    Rectal cancer Neg Hx      Social History   Socioeconomic History   Marital status: Married    Spouse name: Not on file   Number of children: Not on file   Years of education:  Not on file   Highest education level: Not on file  Occupational History   Not on file  Tobacco Use   Smoking status: Former    Current packs/day: 0.00    Average packs/day: 0.3 packs/day for 10.0 years (3.0 ttl pk-yrs)    Types: Cigarettes    Start date: 10/27/1995    Quit date: 10/26/2005    Years since quitting: 18.6    Passive exposure: Never   Smokeless tobacco: Never  Vaping Use   Vaping status: Never Used  Substance and Sexual Activity   Alcohol  use: Yes    Alcohol /week: 12.0 standard drinks of alcohol     Types: 12 Glasses of wine per week    Comment: 2 glasses of wine daily   Drug use: No   Sexual activity: Yes    Birth control/protection: Surgical    Comment: INTERCOURSE AGE 16 , LESS THAN 5 SEXUAL PARTNERS  Other Topics Concern   Not on file  Social History Narrative   Not on file   Social Drivers of Health   Financial Resource Strain: Not on file  Food Insecurity: No Food Insecurity (11/08/2023)   Hunger Vital Sign    Worried About Running Out of Food in the Last Year: Never true    Ran Out of Food in the Last Year: Never true  Transportation Needs: No Transportation Needs (11/08/2023)   PRAPARE - Administrator, Civil Service (Medical): No    Lack of Transportation (Non-Medical): No  Physical Activity: Not on file  Stress: Not on file  Social Connections: Socially Integrated (11/08/2023)   Social Connection and Isolation Panel    Frequency of Communication with Friends and Family: Twice a week    Frequency of Social Gatherings with Friends and Family: Twice a week    Attends Religious Services: 1 to 4 times per year    Active Member of [employer] or Organizations: Yes    Attends Engineer, structural: More than 4 times per year    Marital Status: Married  Catering manager Violence: Not At Risk (11/08/2023)   Humiliation, Afraid, Rape, and Kick questionnaire    Fear of Current or Ex-Partner: No    Emotionally Abused: No    Physically Abused: No     Sexually Abused: No     Allergies  Allergen Reactions   Augmentin [Amoxicillin-Pot Clavulanate] Diarrhea   Nitrofurantoin Other (See Comments)    Sweating and chills     Ramipril  Hives and Swelling      Other reaction(s): dr. Frutoso wanted it changed due to some allergy issues like swelling., Other (See Comments)   Thiazide-Type Diuretics Other (See Comments)    Other reaction(s): stop due to gout   Uloric [Febuxostat] Other (See Comments)  Other reaction(s): made her itch. urine smelled bad. felt better off of it. but could go back on it if needed she says   Anastrozole  Other (See Comments)    Other reaction(s): couldn't sleep, Other (See Comments)   Molnupiravir Rash    Other reaction(s): rash   Pravastatin Other (See Comments)    Other reaction(s): felt miserable on it.felt hot and sweaty in the night.     Outpatient Medications Prior to Visit  Medication Sig Dispense Refill   allopurinol  (ZYLOPRIM ) 100 MG tablet Take 200 mg by mouth daily.     Ascorbic Acid (VITAMIN C PO) Take by mouth daily.     b complex vitamins capsule Take 1 capsule by mouth daily.     celecoxib  (CELEBREX ) 200 MG capsule Take 200 mg by mouth daily.     Cholecalciferol (VITAMIN D PO) Take 2,000 Units by mouth daily.      citalopram  (CELEXA ) 20 MG tablet Take 1 tablet (20 mg total) by mouth daily. 90 tablet 2   loratadine  (CLARITIN ) 10 MG tablet Take 10 mg by mouth daily.     MAGNESIUM PO Take 1 tablet by mouth daily.     methocarbamol  (ROBAXIN ) 500 MG tablet Take 1 tablet (500 mg total) by mouth every 6 (six) hours as needed for muscle spasms. 40 tablet 0   nebivolol  (BYSTOLIC ) 10 MG tablet Take 10 mg by mouth daily.     omeprazole (PRILOSEC) 20 MG capsule Take 20 mg by mouth daily.     tamoxifen  (NOLVADEX ) 10 MG tablet Take 1 tablet (10 mg total) by mouth daily. 90 tablet 3   No facility-administered medications prior to visit.         Objective:   Physical Exam Vitals:   05/31/24 1119   BP: 108/68  Pulse: 81  Temp: 98 F (36.7 C)  SpO2: 98%  Weight: 161 lb (73 kg)  Height: 5' 4 (1.626 m)   Gen: Pleasant, well-nourished, in no distress,  normal affect  ENT: No lesions,  mouth clear,  oropharynx clear, slightly hoarse voice  Neck: No JVD, no stridor  Lungs: No use of accessory muscles, right basilar inspiratory crackles, otherwise clear  Cardiovascular: RRR, heart sounds normal, no murmur or gallops, no peripheral edema  Musculoskeletal: No deformities, no cyanosis or clubbing  Neuro: alert, awake, non focal  Skin: Warm, she has a rigid circumferential rash with areas of dryness on the posterior neck, no overt scaling      Assessment & Plan:  ILD (interstitial lung disease) (HCC) I am glad that you are doing well. We will follow-up in 1 year talk about how your breathing is doing.  At that time we will decide whether you need pulmonary function testing and or a repeat CT scan of your chest.  We will hold off on those test for now.  Please let us  know if your breathing changes in any way.  If so we may decide to perform testing at that time.     Lamar Chris, MD, PhD 05/31/2024, 5:07 PM [facility] [phone] or if no answer before 7:00PM call [phone] For any issues after 7:00PM please call eLink [phone]

## 2024-05-31 NOTE — Assessment & Plan Note (Signed)
 I am glad that you are doing well. We will follow-up in 1 year talk about how your breathing is doing.  At that time we will decide whether you need pulmonary function testing and or a repeat CT scan of your chest.  We will hold off on those test for now.  Please let us  know if your breathing changes in any way.  If so we may decide to perform testing at that time.

## 2024-06-06 ENCOUNTER — Telehealth: Payer: Self-pay | Admitting: *Deleted

## 2024-06-06 NOTE — Telephone Encounter (Signed)
 Error

## 2024-06-07 NOTE — Assessment & Plan Note (Signed)
 Dr. 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  Currently on 10 mg daily. Tamoxifen  toxicities: Mild intermittent hot flashes Initial plan is to treat her for 5 years.   Breast cancer surveillance: 1.  Breast exam 06/08/24: Benign 2. mammogram 04/06/24 Benign breast density category C 3.  06/21/2023: High-resolution CT chest without contrast: Findings compatible with interstitial lung disease and hepatic steatosis, unchanged   Her husband had prostate cancer and is doing well. Return to clinic in 1 year for follow-up

## 2024-06-08 ENCOUNTER — Inpatient Hospital Stay: Attending: Hematology and Oncology | Admitting: Hematology and Oncology

## 2024-06-08 VITALS — BP 111/62 | HR 74 | Temp 98.3°F | Resp 18 | Ht 64.0 in | Wt 166.0 lb

## 2024-06-08 DIAGNOSIS — C50211 Malignant neoplasm of upper-inner quadrant of right female breast: Secondary | ICD-10-CM | POA: Insufficient documentation

## 2024-06-08 DIAGNOSIS — R232 Flushing: Secondary | ICD-10-CM | POA: Diagnosis not present

## 2024-06-08 DIAGNOSIS — Z79899 Other long term (current) drug therapy: Secondary | ICD-10-CM | POA: Diagnosis not present

## 2024-06-08 DIAGNOSIS — D649 Anemia, unspecified: Secondary | ICD-10-CM | POA: Diagnosis not present

## 2024-06-08 DIAGNOSIS — Z170 Estrogen receptor positive status [ER+]: Secondary | ICD-10-CM | POA: Insufficient documentation

## 2024-06-08 DIAGNOSIS — Z880 Allergy status to penicillin: Secondary | ICD-10-CM | POA: Diagnosis not present

## 2024-06-08 DIAGNOSIS — Z1721 Progesterone receptor positive status: Secondary | ICD-10-CM | POA: Diagnosis not present

## 2024-06-08 DIAGNOSIS — Z881 Allergy status to other antibiotic agents status: Secondary | ICD-10-CM | POA: Insufficient documentation

## 2024-06-08 DIAGNOSIS — Z1732 Human epidermal growth factor receptor 2 negative status: Secondary | ICD-10-CM | POA: Diagnosis not present

## 2024-06-08 NOTE — Progress Notes (Signed)
 Just yet this test Kappa  Patient Care Team: Shayne Anes, MD as PCP - General (Internal Medicine) Fernande Elspeth BROCKS, MD as PCP - Electrophysiology (Cardiology) Curvin Deward MOULD, MD as Consulting Physician (General Surgery) Izell Domino, MD as Attending Physician (Radiation Oncology) Melodi Lerner, MD as Consulting Physician (Orthopedic Surgery) Odean Potts, MD as Consulting Physician (Hematology and Oncology)  DIAGNOSIS:  Encounter Diagnosis  Name Primary?   Malignant neoplasm of upper-inner quadrant of right breast in female, estrogen receptor positive (HCC) Yes    SUMMARY OF ONCOLOGIC HISTORY: Oncology History  Malignant neoplasm of upper-inner quadrant of right breast in female, estrogen receptor positive (HCC)  03/14/2020 Initial Diagnosis   Malignant neoplasm of upper-inner quadrant of right breast in female, estrogen receptor positive (HCC)   03/27/2020 Cancer Staging   Staging form: Breast, AJCC 8th Edition - Pathologic stage from 03/27/2020: Stage IA (pT1b, pN0, cM0, G2, ER+, PR+, HER2-) - Signed by Crawford Morna Pickle, NP on 04/10/2020     CHIEF COMPLIANT: History of breast cancer, follow-up of anemia  HISTORY OF PRESENT ILLNESS:   History of Present Illness Judy Chapman is an 78 year old female who presents for evaluation of mild anemia.  She experienced a drop in hemoglobin to 10.3 in January 2025, which normalized by March 2025. Recent blood work from May 30, 2024, shows a hemoglobin level of 11.4, slightly lower than previous levels of 11.8 in June 2025 and 11.7 in November 2024. Anemia was initially noted during a colonoscopy in March 2025. She is currently taking a B12 supplement at a dose of 1000 mcg and has been incorporating more plant-based iron sources into her diet, such as spinach.     ALLERGIES:  is allergic to augmentin [amoxicillin-pot clavulanate], nitrofurantoin, ramipril , thiazide-type diuretics, uloric [febuxostat], anastrozole ,  molnupiravir, and pravastatin.  MEDICATIONS:  Current Outpatient Medications  Medication Sig Dispense Refill   allopurinol  (ZYLOPRIM ) 100 MG tablet Take 200 mg by mouth daily.     Ascorbic Acid (VITAMIN C PO) Take by mouth daily.     b complex vitamins capsule Take 1 capsule by mouth daily.     celecoxib  (CELEBREX ) 200 MG capsule Take 200 mg by mouth daily.     Cholecalciferol (VITAMIN D PO) Take 2,000 Units by mouth daily.      citalopram  (CELEXA ) 20 MG tablet Take 1 tablet (20 mg total) by mouth daily. 90 tablet 2   loratadine  (CLARITIN ) 10 MG tablet Take 10 mg by mouth daily.     MAGNESIUM PO Take 1 tablet by mouth daily.     methocarbamol  (ROBAXIN ) 500 MG tablet Take 1 tablet (500 mg total) by mouth every 6 (six) hours as needed for muscle spasms. 40 tablet 0   nebivolol  (BYSTOLIC ) 10 MG tablet Take 10 mg by mouth daily.     omeprazole (PRILOSEC) 20 MG capsule Take 20 mg by mouth daily.     tamoxifen  (NOLVADEX ) 10 MG tablet Take 1 tablet (10 mg total) by mouth daily. 90 tablet 3   No current facility-administered medications for this visit.    PHYSICAL EXAMINATION: ECOG PERFORMANCE STATUS: 1 - Symptomatic but completely ambulatory  There were no vitals filed for this visit. There were no vitals filed for this visit.  Physical Exam   (exam performed in the presence of a chaperone)  LABORATORY DATA:  I have reviewed the data as listed    Latest Ref Rng & Units 12/31/2023    1:30 PM 11/09/2023    3:44 AM  11/03/2023   11:25 AM  CMP  Glucose 70 - 99 mg/dL 93  881  95   BUN 8 - 27 mg/dL 22  18  25    Creatinine 0.57 - 1.00 mg/dL 9.03  9.27  9.08   Sodium 134 - 144 mmol/L 143  134  142   Potassium 3.5 - 5.2 mmol/L 4.7  3.7  3.8   Chloride 96 - 106 mmol/L 107  103  109   CO2 20 - 29 mmol/L 21  22  26    Calcium 8.7 - 10.3 mg/dL 9.1  7.6  8.5   Total Protein 6.5 - 8.1 g/dL   6.5   Total Bilirubin 0.0 - 1.2 mg/dL   0.7   Alkaline Phos 38 - 126 U/L   39   AST 15 - 41 U/L   27    ALT 0 - 44 U/L   21     Lab Results  Component Value Date   WBC 6.2 12/31/2023   HGB 11.7 12/31/2023   HCT 36.9 12/31/2023   MCV 100 (H) 12/31/2023   PLT 187 12/31/2023   NEUTROABS 3.6 08/04/2022    ASSESSMENT & PLAN:  Malignant neoplasm of upper-inner quadrant of right breast in female, estrogen receptor positive (HCC) Dr. 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  Currently on 10 mg daily. Tamoxifen  toxicities: Mild intermittent hot flashes Initial plan is to treat her for 5 years.   Breast cancer surveillance: 1.  Breast exam 06/08/24: Benign 2. mammogram 04/06/24 Benign breast density category C 3.  06/21/2023: High-resolution CT chest without contrast: Findings compatible with interstitial lung disease and hepatic steatosis, unchanged   Normocytic anemia: Lab review: 05/30/2024: Hemoglobin 11.4, MCV 97, RDW 14.8, SPEP: No M protein, B12 1558, iron saturation 39%, ferritin 212, CMP: Normal I discussed with the patient that she has a very mild normocytic anemia and although the differential diagnosis is vast I do not think there is any significant/serious bone marrow disorder. I encouraged her to continue with her B12 supplements and eat more plant-based iron containing foods.  She will come back to see us  annually.  If her hemoglobin drops below 10 then she will need extensive/additional workup.  Patient understands this and is agreeable.  Her husband had prostate cancer and is doing well. Return to clinic in 1 year for follow-up   Assessment & Plan Mild normocytic anemia Mild normocytic anemia with hemoglobin 11.4-11.8. Normal RBC size excludes iron and B12 deficiencies. Unlikely chronic illness, inflammation, kidney issues, hypothyroidism, hemolysis, or myeloma due to  normal kidney and thyroid function. Bone marrow disorders unlikely with normal WBC and platelet counts. Likely normal fluctuation. - Encouraged dietary adjustments to include more plant-based iron-containing foods. - Continue current B12 supplementation with sublingual form once current supply is finished. - Monitor hemoglobin levels; if hemoglobin drops below 10, initiate further workup. - Annual follow-up unless hemoglobin drops below 10.      No orders of the defined types were placed in this encounter.  The patient has a good understanding of the overall plan. she agrees with it. she will call with any problems that may develop before the next visit here. Total time spent: 30 mins including face to face time and time spent for planning, charting and co-ordination of care   Naomi MARLA Chad, MD 06/08/24

## 2024-06-12 ENCOUNTER — Ambulatory Visit (INDEPENDENT_AMBULATORY_CARE_PROVIDER_SITE_OTHER)

## 2024-06-12 DIAGNOSIS — I495 Sick sinus syndrome: Secondary | ICD-10-CM | POA: Diagnosis not present

## 2024-06-13 DIAGNOSIS — M8589 Other specified disorders of bone density and structure, multiple sites: Secondary | ICD-10-CM | POA: Diagnosis not present

## 2024-06-13 LAB — CUP PACEART REMOTE DEVICE CHECK
Battery Remaining Longevity: 157 mo
Battery Voltage: 3.2 V
Brady Statistic AP VP Percent: 0.05 %
Brady Statistic AP VS Percent: 95.79 %
Brady Statistic AS VP Percent: 0 %
Brady Statistic AS VS Percent: 4.16 %
Brady Statistic RA Percent Paced: 95.9 %
Brady Statistic RV Percent Paced: 0.05 %
Date Time Interrogation Session: 20250818060149
Implantable Lead Connection Status: 753985
Implantable Lead Connection Status: 753985
Implantable Lead Implant Date: 20020701
Implantable Lead Implant Date: 20020701
Implantable Lead Location: 753859
Implantable Lead Location: 753860
Implantable Lead Model: 4469
Implantable Lead Model: 4470
Implantable Lead Serial Number: 313853
Implantable Lead Serial Number: 323286
Implantable Pulse Generator Implant Date: 20250402
Lead Channel Impedance Value: 304 Ohm
Lead Channel Impedance Value: 380 Ohm
Lead Channel Impedance Value: 399 Ohm
Lead Channel Impedance Value: 399 Ohm
Lead Channel Pacing Threshold Amplitude: 0.75 V
Lead Channel Pacing Threshold Amplitude: 0.875 V
Lead Channel Pacing Threshold Pulse Width: 0.4 ms
Lead Channel Pacing Threshold Pulse Width: 0.4 ms
Lead Channel Sensing Intrinsic Amplitude: 0.875 mV
Lead Channel Sensing Intrinsic Amplitude: 0.875 mV
Lead Channel Sensing Intrinsic Amplitude: 4.5 mV
Lead Channel Sensing Intrinsic Amplitude: 4.5 mV
Lead Channel Setting Pacing Amplitude: 1.5 V
Lead Channel Setting Pacing Amplitude: 2.5 V
Lead Channel Setting Pacing Pulse Width: 0.4 ms
Lead Channel Setting Sensing Sensitivity: 1.2 mV
Zone Setting Status: 755011
Zone Setting Status: 755011

## 2024-06-19 ENCOUNTER — Ambulatory Visit: Payer: Self-pay | Admitting: Cardiology

## 2024-07-19 NOTE — Progress Notes (Signed)
 Remote PPM Transmission

## 2024-08-03 ENCOUNTER — Telehealth: Payer: Self-pay

## 2024-08-03 ENCOUNTER — Telehealth: Payer: Self-pay | Admitting: *Deleted

## 2024-08-03 DIAGNOSIS — M1711 Unilateral primary osteoarthritis, right knee: Secondary | ICD-10-CM | POA: Diagnosis not present

## 2024-08-03 NOTE — Telephone Encounter (Signed)
 Med Rec and Consent done     Patient Consent for Virtual Visit        Judy Chapman has provided verbal consent on 08/03/2024 for a virtual visit (video or telephone).   CONSENT FOR VIRTUAL VISIT FOR:  Judy Chapman  By participating in this virtual visit I agree to the following:  I hereby voluntarily request, consent and authorize [facility] and its employed or contracted physicians, physician assistants, nurse practitioners or other licensed health care professionals (the Practitioner), to provide me with telemedicine health care services (the "Services) as deemed necessary by the treating Practitioner. I acknowledge and consent to receive the Services by the Practitioner via telemedicine. I understand that the telemedicine visit will involve communicating with the Practitioner through live audiovisual communication technology and the disclosure of certain medical information by electronic transmission. I acknowledge that I have been given the opportunity to request an in-person assessment or other available alternative prior to the telemedicine visit and am voluntarily participating in the telemedicine visit.  I understand that I have the right to withhold or withdraw my consent to the use of telemedicine in the course of my care at any time, without affecting my right to future care or treatment, and that the Practitioner or I may terminate the telemedicine visit at any time. I understand that I have the right to inspect all information obtained and/or recorded in the course of the telemedicine visit and may receive copies of available information for a reasonable fee.  I understand that some of the potential risks of receiving the Services via telemedicine include:  Delay or interruption in medical evaluation due to technological equipment failure or disruption; Information transmitted may not be sufficient (e.g. poor resolution of images) to allow for appropriate  medical decision making by the Practitioner; and/or  In rare instances, security protocols could fail, causing a breach of personal health information.  Furthermore, I acknowledge that it is my responsibility to provide information about my medical history, conditions and care that is complete and accurate to the best of my ability. I acknowledge that Practitioner's advice, recommendations, and/or decision may be based on factors not within their control, such as incomplete or inaccurate data provided by me or distortions of diagnostic images or specimens that may result from electronic transmissions. I understand that the practice of medicine is not an exact science and that Practitioner makes no warranties or guarantees regarding treatment outcomes. I acknowledge that a copy of this consent can be made available to me via my patient portal [facility] MyChart), or I can request a printed copy by calling the office of [facility].    I understand that my insurance will be billed for this visit.   I have read or had this consent read to me. I understand the contents of this consent, which adequately explains the benefits and risks of the Services being provided via telemedicine.  I have been provided ample opportunity to ask questions regarding this consent and the Services and have had my questions answered to my satisfaction. I give my informed consent for the services to be provided through the use of telemedicine in my medical care

## 2024-08-03 NOTE — Telephone Encounter (Signed)
 S/W pt and scheduled TELE preop appt for 10/06/24. Med Rec and Consent done   Will update the surgeons office.

## 2024-08-03 NOTE — Telephone Encounter (Signed)
   Pre-operative Risk Assessment    Patient Name: Judy Chapman  DOB: 14-Sep-1946 MRN: [id number]   Date of last office visit: 05/01/2024 Date of next office visit: N/A   Request for Surgical Clearance    Procedure:  RIGHT TOTAL KNEE ARTHROPLASTY  Date of Surgery:  Clearance 10/30/24                                Surgeon:  DR. DEMPSEY MOAN Surgeon's Group or Practice Name:  [facility] Phone number:  [phone] Fax number:  [phone]   Type of Clearance Requested:   - Medical    Type of Anesthesia:  CHOICE   Additional requests/questions:    Signed, Lonette Stevison   08/03/2024, 2:26 PM

## 2024-08-03 NOTE — Telephone Encounter (Signed)
   Name: Judy Chapman  DOB: Feb 23, 1946  MRN: [id number]  Primary Cardiologist: None   Preoperative team, please contact this patient and set up a phone call appointment for further preoperative risk assessment. Please obtain consent and complete medication review. Thank you for your help.  I confirm that guidance regarding antiplatelet and oral anticoagulation therapy has been completed and, if necessary, noted below.  None requested.  I also confirmed the patient resides in the state of [place] . As per [facility] Medical Board telemedicine laws, the patient must reside in the state in which the provider is licensed.   Josefa CHRISTELLA Beauvais, NP 08/03/2024, 3:37 PM [facility]

## 2024-08-10 DIAGNOSIS — M1711 Unilateral primary osteoarthritis, right knee: Secondary | ICD-10-CM | POA: Diagnosis not present

## 2024-09-07 ENCOUNTER — Ambulatory Visit: Payer: Medicare PPO | Admitting: Hematology and Oncology

## 2024-09-11 ENCOUNTER — Ambulatory Visit

## 2024-09-11 DIAGNOSIS — I495 Sick sinus syndrome: Secondary | ICD-10-CM

## 2024-09-12 LAB — CUP PACEART REMOTE DEVICE CHECK
Battery Remaining Longevity: 154 mo
Battery Voltage: 3.16 V
Brady Statistic AP VP Percent: 0.07 %
Brady Statistic AP VS Percent: 98.03 %
Brady Statistic AS VP Percent: 0 %
Brady Statistic AS VS Percent: 1.91 %
Brady Statistic RA Percent Paced: 98.13 %
Brady Statistic RV Percent Paced: 0.07 %
Date Time Interrogation Session: 20251116225717
Implantable Lead Connection Status: 753985
Implantable Lead Connection Status: 753985
Implantable Lead Implant Date: 20020701
Implantable Lead Implant Date: 20020701
Implantable Lead Location: 753859
Implantable Lead Location: 753860
Implantable Lead Model: 4469
Implantable Lead Model: 4470
Implantable Lead Serial Number: 313853
Implantable Lead Serial Number: 323286
Implantable Pulse Generator Implant Date: 20250402
Lead Channel Impedance Value: 304 Ohm
Lead Channel Impedance Value: 380 Ohm
Lead Channel Impedance Value: 399 Ohm
Lead Channel Impedance Value: 437 Ohm
Lead Channel Pacing Threshold Amplitude: 0.625 V
Lead Channel Pacing Threshold Amplitude: 0.75 V
Lead Channel Pacing Threshold Pulse Width: 0.4 ms
Lead Channel Pacing Threshold Pulse Width: 0.4 ms
Lead Channel Sensing Intrinsic Amplitude: 1.25 mV
Lead Channel Sensing Intrinsic Amplitude: 1.25 mV
Lead Channel Sensing Intrinsic Amplitude: 6.375 mV
Lead Channel Sensing Intrinsic Amplitude: 6.375 mV
Lead Channel Setting Pacing Amplitude: 1.5 V
Lead Channel Setting Pacing Amplitude: 2.5 V
Lead Channel Setting Pacing Pulse Width: 0.4 ms
Lead Channel Setting Sensing Sensitivity: 1.2 mV
Zone Setting Status: 755011
Zone Setting Status: 755011

## 2024-09-13 ENCOUNTER — Ambulatory Visit: Payer: Self-pay | Admitting: Cardiology

## 2024-09-13 NOTE — Progress Notes (Signed)
 Remote PPM Transmission

## 2024-10-06 ENCOUNTER — Ambulatory Visit: Attending: Cardiology | Admitting: Emergency Medicine

## 2024-10-06 DIAGNOSIS — Z01810 Encounter for preprocedural cardiovascular examination: Secondary | ICD-10-CM

## 2024-10-06 NOTE — Progress Notes (Signed)
 Virtual Visit via Telephone Note   Because of Anitria Andon co-morbid illnesses, she is at least at moderate risk for complications without adequate follow up.  This format is felt to be most appropriate for this patient at this time.  Due to technical limitations with video connection (technology), today's appointment will be conducted as an audio only telehealth visit, and Judy Chapman verbally agreed to proceed in this manner.   All issues noted in this document were discussed and addressed.  No physical exam could be performed with this format.  Evaluation Performed:  Preoperative cardiovascular risk assessment _____________   Date:  10/06/2024   Patient ID:  Judy Chapman, Judy Chapman [birth year], MRN [id number] Patient Location:  Home Provider location:   Office  Primary Care Provider:  Shayne Anes, MD Primary Cardiologist:  None  Chief Complaint / Patient Profile   78 y.o. y/o female with a h/o sinus node dysfunction PPM, hypertension, subclinical AF, breast cancer, gout who is pending right total knee arthroplasty on 10/30/2024 with [facility] and presents today for telephonic preoperative cardiovascular risk assessment.  History of Present Illness    Judy Chapman is a 78 y.o. female who presents via audio/video conferencing for a telehealth visit today.  Pt was last seen in cardiology clinic on 05/01/2024 by Charlies, PA.  At that time Judy Chapman was doing well.  The patient is now pending procedure as outlined above. Since her last visit, she denies chest pain, shortness of breath, lower extremity edema, fatigue, palpitations, melena, hematuria, hemoptysis, diaphoresis, weakness, presyncope, syncope, orthopnea, and PND.  Today patient is doing well without acute cardiovascular concerns or complaints.  She denies any symptoms concerning for atrial fibrillation.  She denies any palpitations or tachycardia.  She does stay relatively active around the  house without exertional symptoms.  Her activity level is somewhat limited due to her right knee pain.  She is without any symptoms concerning for active angina.  She is able to complete greater than 4 METS.  Past Medical History    Past Medical History:  Diagnosis Date   ACE inhibitor intolerance    Arthritis    Asthma    outgrew as a child   Breast cancer (HCC)    Cancer (HCC)    right breast   Dysrhythmia    SSS   Early cataract    Endometriosis    GERD (gastroesophageal reflux disease)    Gout    Heart disease    Hypertension    Interstitial lung disease (HCC)    Osteopenia    Pacemaker-MDT    DOI-2002, Generator change 2010   Pneumonia    PVC (premature ventricular contraction)    Sinus node dysfunction (HCC)    Wears glasses    Past Surgical History:  Procedure Laterality Date   BREAST LUMPECTOMY Right 2021   BREAST LUMPECTOMY WITH RADIOACTIVE SEED LOCALIZATION Right 03/27/2020   Procedure: RIGHT BREAST LUMPECTOMY WITH RADIOACTIVE SEED LOCALIZATION;  Surgeon: Curvin Deward MOULD, MD;  Location: [facility] OR;  Service: General;  Laterality: Right;   CORONARY ANGIOPLASTY     DILATION AND CURETTAGE OF UTERUS     EYE SURGERY Bilateral    cataract extraction w/ IOL   GALLBLADDER SURGERY     HYSTEROSCOPY     PACEMAKER INSERTION     Medtronic   PPM GENERATOR CHANGEOUT N/A 01/26/2024   Procedure: PPM GENERATOR CHANGEOUT;  Surgeon: Fernande Elspeth BROCKS, MD;  Location: [facility] INVASIVE CV LAB;  Service:  Cardiovascular;  Laterality: N/A;   REPLACEMENT TOTAL KNEE Left 11/08/2023   TONSILLECTOMY     TOTAL KNEE ARTHROPLASTY Left 11/08/2023   Procedure: TOTAL KNEE ARTHROPLASTY;  Surgeon: Melodi Lerner, MD;  Location: [facility] ORS;  Service: Orthopedics;  Laterality: Left;   TUBAL LIGATION     VAGINAL HYSTERECTOMY     WISDOM TOOTH EXTRACTION      Allergies  Allergies[1]  Home Medications    Prior to Admission medications  Medication Sig Start Date End Date Taking? Authorizing Provider   allopurinol  (ZYLOPRIM ) 100 MG tablet Take 200 mg by mouth daily. 10/11/19   [provider]  Ascorbic Acid (VITAMIN C PO) Take by mouth daily.    [provider]  b complex vitamins capsule Take 1 capsule by mouth daily.    [provider]  celecoxib  (CELEBREX ) 200 MG capsule Take 200 mg by mouth daily.    [provider]  Cholecalciferol (VITAMIN D PO) Take 2,000 Units by mouth daily.     [provider]  citalopram  (CELEXA ) 20 MG tablet Take 1 tablet (20 mg total) by mouth daily. 03/26/11   Tisa Bars, MD  loratadine  (CLARITIN ) 10 MG tablet Take 10 mg by mouth daily.    [provider]  MAGNESIUM PO Take 1 tablet by mouth daily.    [provider]  methocarbamol  (ROBAXIN ) 500 MG tablet Take 1 tablet (500 mg total) by mouth every 6 (six) hours as needed for muscle spasms. 11/09/23   Kristian Stabs, PA  nebivolol  (BYSTOLIC ) 10 MG tablet Take 10 mg by mouth daily.    [provider]  omeprazole (PRILOSEC) 20 MG capsule Take 20 mg by mouth daily.    [provider]  tamoxifen  (NOLVADEX ) 10 MG tablet Take 1 tablet (10 mg total) by mouth daily. 09/07/23   Odean Potts, MD    Physical Exam    Vital Signs:  Judy Chapman does not have vital signs available for review today.  Given telephonic nature of communication, physical exam is limited. AAOx3. NAD. Normal affect.  Speech and respirations are unlabored.  Accessory Clinical Findings    None  Assessment & Plan    1.  Preoperative Cardiovascular Risk Assessment: According to the Revised Cardiac Risk Index (RCRI), her Perioperative Risk of Major Cardiac Event is (%): 0.4. Her Functional Capacity in METs is: 5.07 according to the [facility] Activity Status Index (DASI).  Therefore, based on ACC/AHA guidelines, patient would be at acceptable risk for the planned procedure without further cardiovascular testing. I will route this recommendation to the  requesting party via Epic fax function.  The patient was advised that if she develops new symptoms prior to surgery to contact our office to arrange for a follow-up visit, and she verbalized understanding.  A copy of this note will be routed to requesting surgeon.  Time:   Today, I have spent 8 minutes with the patient with telehealth technology discussing medical history, symptoms, and management plan.     Lum LITTIE Louis, NP  10/06/2024, 10:29 AM     [1]  Allergies Allergen Reactions   Augmentin [Amoxicillin-Pot Clavulanate] Diarrhea   Nitrofurantoin Other (See Comments)    Sweating and chills     Ramipril  Hives and Swelling      Other reaction(s): dr. Frutoso wanted it changed due to some allergy issues like swelling., Other (See Comments)   Thiazide-Type Diuretics Other (See Comments)    Other reaction(s): stop due to gout   Uloric [Febuxostat]  Other (See Comments)    Other reaction(s): made her itch. urine smelled bad. felt better off of it. but could go back on it if needed she says   Anastrozole  Other (See Comments)    Other reaction(s): couldn't sleep, Other (See Comments)   Molnupiravir Rash    Other reaction(s): rash   Pravastatin Other (See Comments)    Other reaction(s): felt miserable on it.felt hot and sweaty in the night.

## 2024-10-12 ENCOUNTER — Other Ambulatory Visit: Payer: Self-pay | Admitting: Hematology and Oncology

## 2024-10-13 ENCOUNTER — Other Ambulatory Visit: Payer: Self-pay

## 2024-10-16 NOTE — H&P (Signed)
 TOTAL KNEE ADMISSION H&P  Patient is being admitted for right total knee arthroplasty.  Subjective:  Chief Complaint: Right knee pain.  HPI: Judy Chapman, 78 y.o. female has a history of pain and functional disability in the right knee due to arthritis and has failed non-surgical conservative treatments for greater than 12 weeks to include NSAID's and/or analgesics, corticosteriod injections, and activity modification. Onset of symptoms was gradual, starting several years ago with gradually worsening course since that time. The patient noted no past surgery on the right knee.  Patient currently rates pain in the right knee at 8 out of 10 with activity. Patient has worsening of pain with activity and weight bearing and pain that interferes with activities of daily living. Patient has evidence of bone-on-bone degenerative changes in the medial and patellofemoral compartments, near bone-on-bone changes laterally by imaging studies. There is no active infection.  Patient Active Problem List   Diagnosis Date Noted   OA (osteoarthritis) of knee 11/08/2023   Osteoarthritis of left knee 11/08/2023   Rheumatoid factor positive 09/25/2022   Generalized osteoarthritis of multiple sites 09/25/2022   ILD (interstitial lung disease) (HCC) 06/04/2022   Malignant neoplasm of upper-inner quadrant of right breast in female, estrogen receptor positive (HCC) 03/14/2020   Sinoatrial node dysfunction (HCC) 02/08/2013   Allergic rhinitis 02/11/2012   Lung mass 02/11/2012   Pacemaker-MDT    Cough 01/21/2012   Endometriosis    Osteopenia    Hypertension 03/26/2011    Past Medical History:  Diagnosis Date   ACE inhibitor intolerance    Arthritis    Asthma    outgrew as a child   Breast cancer (HCC)    Cancer (HCC)    right breast   Dysrhythmia    SSS   Early cataract    Endometriosis    GERD (gastroesophageal reflux disease)    Gout    Heart disease    Hypertension    Interstitial lung  disease (HCC)    Osteopenia    Pacemaker-MDT    DOI-2002, Generator change 2010   Pneumonia    PVC (premature ventricular contraction)    Sinus node dysfunction (HCC)    Wears glasses     Past Surgical History:  Procedure Laterality Date   BREAST LUMPECTOMY Right 2021   BREAST LUMPECTOMY WITH RADIOACTIVE SEED LOCALIZATION Right 03/27/2020   Procedure: RIGHT BREAST LUMPECTOMY WITH RADIOACTIVE SEED LOCALIZATION;  Surgeon: Curvin Deward MOULD, MD;  Location: [facility] OR;  Service: General;  Laterality: Right;   CORONARY ANGIOPLASTY     DILATION AND CURETTAGE OF UTERUS     EYE SURGERY Bilateral    cataract extraction w/ IOL   GALLBLADDER SURGERY     HYSTEROSCOPY     PACEMAKER INSERTION     Medtronic   PPM GENERATOR CHANGEOUT N/A 01/26/2024   Procedure: PPM GENERATOR CHANGEOUT;  Surgeon: Fernande Elspeth BROCKS, MD;  Location: [facility] INVASIVE CV LAB;  Service: Cardiovascular;  Laterality: N/A;   REPLACEMENT TOTAL KNEE Left 11/08/2023   TONSILLECTOMY     TOTAL KNEE ARTHROPLASTY Left 11/08/2023   Procedure: TOTAL KNEE ARTHROPLASTY;  Surgeon: Melodi Lerner, MD;  Location: [facility] ORS;  Service: Orthopedics;  Laterality: Left;   TUBAL LIGATION     VAGINAL HYSTERECTOMY     WISDOM TOOTH EXTRACTION      Prior to Admission medications  Medication Sig Start Date End Date Taking? Authorizing Provider  allopurinol  (ZYLOPRIM ) 100 MG tablet Take 200 mg by mouth daily. 10/11/19  Yes [provider]  celecoxib  (CELEBREX ) 200 MG capsule Take 200 mg by mouth daily.   Yes [provider]  Cholecalciferol (VITAMIN D PO) Take 2,000 Units by mouth daily.    Yes [provider]  citalopram  (CELEXA ) 20 MG tablet Take 1 tablet (20 mg total) by mouth daily. 03/26/11  Yes Tisa Bars, MD  cyanocobalamin (VITAMIN B12) 1000 MCG tablet Take 1,000 mcg by mouth daily.   Yes [provider]  loratadine  (CLARITIN ) 10 MG tablet Take 10 mg by mouth daily.   Yes [provider]  MAGNESIUM PO  Take 2 tablets by mouth at bedtime.   Yes [provider]  nebivolol  (BYSTOLIC ) 10 MG tablet Take 10 mg by mouth daily.   Yes [provider]  omeprazole (PRILOSEC) 20 MG capsule Take 20 mg by mouth daily.   Yes [provider]  Probiotic Product (PROBIOTIC PO) Take 1 capsule by mouth daily.   Yes [provider]  tamoxifen  (NOLVADEX ) 10 MG tablet Take 1 tablet (10 mg total) by mouth daily. 10/13/24  Yes Odean Potts, MD    Allergies[1]  Social History   Socioeconomic History   Marital status: Married    Spouse name: Not on file   Number of children: Not on file   Years of education: Not on file   Highest education level: Not on file  Occupational History   Not on file  Tobacco Use   Smoking status: Former    Current packs/day: 0.00    Average packs/day: 0.3 packs/day for 10.0 years (3.0 ttl pk-yrs)    Types: Cigarettes    Start date: 10/27/1995    Quit date: 10/26/2005    Years since quitting: 18.9    Passive exposure: Never   Smokeless tobacco: Never  Vaping Use   Vaping status: Never Used  Substance and Sexual Activity   Alcohol  use: Yes    Alcohol /week: 12.0 standard drinks of alcohol     Types: 12 Glasses of wine per week    Comment: 2 glasses of wine daily   Drug use: No   Sexual activity: Yes    Birth control/protection: Surgical    Comment: INTERCOURSE AGE 62 , LESS THAN 5 SEXUAL PARTNERS  Other Topics Concern   Not on file  Social History Narrative   Not on file   Social Drivers of Health   Tobacco Use: Medium Risk (10/06/2024)   Patient History    Smoking Tobacco Use: Former    Smokeless Tobacco Use: Never    Passive Exposure: Never  Physicist, Medical Strain: Not on file  Food Insecurity: No Food Insecurity (06/08/2024)   Epic    Worried About Programme Researcher, Broadcasting/film/video in the Last Year: Never true    Ran Out of Food in the Last Year: Never true  Transportation Needs: No Transportation Needs (06/08/2024)   Epic    Lack of  Transportation (Medical): No    Lack of Transportation (Non-Medical): No  Physical Activity: Not on file  Stress: Not on file  Social Connections: Socially Integrated (11/08/2023)   Social Connection and Isolation Panel    Frequency of Communication with Friends and Family: Twice a week    Frequency of Social Gatherings with Friends and Family: Twice a week    Attends Religious Services: 1 to 4 times per year    Active Member of [employer] or Organizations: Yes    Attends Engineer, Structural: More than 4 times per year    Marital Status: Married  Intimate Partner Violence: Not At Risk (06/08/2024)   Epic    Fear of Current or Ex-Partner: No    Emotionally Abused: No    Physically Abused: No    Sexually Abused: No  Depression (PHQ2-9): Low Risk (06/08/2024)   Depression (PHQ2-9)    PHQ-2 Score: 0  Alcohol  Screen: Not on file  Housing: Unknown (06/08/2024)   Epic    Unable to Pay for Housing in the Last Year: No    Number of Times Moved in the Last Year: Not on file    Homeless in the Last Year: No  Utilities: Not At Risk (06/08/2024)   Epic    Threatened with loss of utilities: No  Health Literacy: Not on file    Tobacco Use: Medium Risk (10/06/2024)   Patient History    Smoking Tobacco Use: Former    Smokeless Tobacco Use: Never    Passive Exposure: Never   Social History   Substance and Sexual Activity  Alcohol  Use Yes   Alcohol /week: 12.0 standard drinks of alcohol    Types: 12 Glasses of wine per week   Comment: 2 glasses of wine daily    Family History  Problem Relation Age of Onset   Hypertension Mother    Dupuytren's contracture Father    Stomach cancer Paternal Grandfather 71   Arrhythmia Neg Hx    Clotting disorder Neg Hx    Heart attack Neg Hx    Fainting Neg Hx    Anemia Neg Hx    Heart disease Neg Hx    Heart failure Neg Hx    Hyperlipidemia Neg Hx    Colon cancer Neg Hx    Rectal cancer Neg Hx     ROS  Objective:  Physical Exam: -  Well-developed female, alert, oriented, no apparent distress.   - Right knee:  No effusion, range of motion 0-125 degrees, slight varus, no tenderness or instability.  Left Knee:  No swelling. No erythema or warmth.  Incision is healing well. Dry and clean with no drainage or tenderness.  AROM 6-113  PROM 3-117  No lateral instability with the knee in 30 degrees of flexion or full extension.  Minimal AP play.  Sensation and motor function intact in LE. Distal pulses 2+. Calves soft and nontender.  Imaging: Weightbearing AP bilateral knees and lateral of the right knee reveals bone-on-bone degenerative changes in the medial and patellofemoral compartments, near bone-on-bone changes laterally. Periarticular osteophytes are noted. Left total knee arthroplasty appears to be in good alignment on AP view.  Assessment/Plan:  End stage arthritis, right knee   The patient history, physical examination, clinical judgment of the provider and imaging studies are consistent with end stage degenerative joint disease of the right knee and total knee arthroplasty is deemed medically necessary. The treatment options including medical management, injection therapy arthroscopy and arthroplasty were discussed at length. The risks and benefits of total knee arthroplasty were presented and reviewed. The risks due to aseptic loosening, infection, stiffness, patella tracking problems, thromboembolic complications and other imponderables were discussed. The patient acknowledged the explanation, agreed to proceed with the plan and consent was signed. Patient is being admitted for inpatient treatment for surgery, pain control, PT, OT, prophylactic antibiotics, VTE prophylaxis, progressive ambulation and ADLs and discharge planning. The patient is planning to be discharged home.   Patient's anticipated LOS is less than 2 midnights, meeting these requirements: - Lives within 1 hour of care - Has a competent adult at  home to recover  with post-op recover - NO history of  - Chronic pain requiring opiods  - Diabetes  - Heart failure  - Heart attack  - Stroke  - DVT/VTE  - Respiratory Failure/COPD  - Renal failure  - Anemia  - Advanced Liver disease   Therapy Plans: EO with Tricia Disposition: Home with husband Planned DVT Prophylaxis: Xarelto 10mg  QD (hx breast cancer and AF) DME Needed: None PCP: Oneil Neth, MD (clearance received) Cardiologist: Sidra Kitty, MD (clearance received) TXA: IV Allergies: ramipril  (swelling) Anesthesia Concerns: None BMI: 29.1 Last HgbA1c: Not diabetic Pain Regimen: Oxycodone  Pharmacy: [facility]  Other: -Currently taking abx for dental infection. Dr Kip at [facility]. Will request clearance from her dentist.  - Patient was instructed on what medications to stop prior to surgery. - Follow-up visit in 2 weeks with Dr. Melodi - Begin physical therapy following surgery - Pre-operative lab work as pre-surgical testing - Prescriptions will be provided in hospital at time of discharge  Corean Sender, PA-C Orthopedic Surgery [facility] [place]       [1]  Allergies Allergen Reactions   Augmentin [Amoxicillin-Pot Clavulanate] Diarrhea   Nitrofurantoin Other (See Comments)    Sweating and chills     Ramipril  Hives and Swelling      Other reaction(s): dr. Frutoso wanted it changed due to some allergy issues like swelling., Other (See Comments)   Thiazide-Type Diuretics Other (See Comments)    Other reaction(s): stop due to gout   Uloric [Febuxostat] Other (See Comments)    Other reaction(s): made her itch. urine smelled bad. felt better off of it. but could go back on it if needed she says   Anastrozole  Other (See Comments)    Other reaction(s): couldn't sleep, Other (See Comments)   Molnupiravir Rash    Other reaction(s): rash   Pravastatin Other (See Comments)    Other reaction(s): felt miserable on it.felt hot and  sweaty in the night.

## 2024-10-23 NOTE — Progress Notes (Signed)
 Anesthesia Review:  PCP: Cardiologist : Lum Louis LVO 10/06/24 - telephone visit   PPM/ ICD: Device Orders:   09/12/24- device check  Rep Notified:  Chest x-ray : CT Chest- 06/21/23  EKG : 12/31/23  Echo : 2023  Stress test: Cardiac Cath :   Activity level:  Sleep Study/ CPAP : Fasting Blood Sugar :      / Checks Blood Sugar -- times a day:    Blood Thinner/ Instructions /Last Dose: ASA / Instructions/ Last Dose :

## 2024-10-23 NOTE — Patient Instructions (Addendum)
 SURGICAL WAITING ROOM VISITATION  Patients having surgery or a procedure may have no more than 2 support people in the waiting area - these visitors may rotate.    Children ages 29 and under will not be able to visit patients in [facility] under most circumstances.   Visitors with respiratory illnesses are discouraged from visiting and should remain at home.  If the patient needs to stay at the hospital during part of their recovery, the visitor guidelines for inpatient rooms apply. Pre-op nurse will coordinate an appropriate time for 1 support person to accompany patient in pre-op.  This support person may not rotate.    Please refer to the [facility] website for the visitor guidelines for Inpatients (after your surgery is over and you are in a regular room).       Your procedure is scheduled on:  10/30/2024    Report to [facility] Main Entrance    Report to admitting at   1110AM   Call this number if you have problems the morning of surgery [phone]   Do not eat food :After Midnight.   After Midnight you may have the following liquids until __ 1030____ AM DAY OF SURGERY  Water Non-Citrus Juices (without pulp, NO RED-Apple, White grape, White cranberry) Black Coffee (NO MILK/CREAM OR CREAMERS, sugar ok)  Clear Tea (NO MILK/CREAM OR CREAMERS, sugar ok) regular and decaf                             Plain Jell-O (NO RED)                                           Fruit ices (not with fruit pulp, NO RED)                                     Popsicles (NO RED)                                                               Sports drinks like Gatorade (NO RED)                   The day of surgery:  Drink ONE (1) Pre-Surgery Clear Ensure or G2 at  1030AM the morning of surgery. Drink in one sitting. Do not sip.  This drink was given to you during your hospital  pre-op appointment visit. Nothing else to drink after completing the  Pre-Surgery Clear Ensure or  G2.          If you have questions, please contact your surgeons office.       Oral Hygiene is also important to reduce your risk of infection.                                    Remember - BRUSH YOUR TEETH THE MORNING OF SURGERY WITH YOUR REGULAR TOOTHPASTE  DENTURES WILL BE REMOVED PRIOR TO SURGERY PLEASE DO NOT APPLY Poly grip  OR ADHESIVES!!!   Do NOT smoke after Midnight   Stop all vitamins and herbal supplements 7 days before surgery.   Take these medicines the morning of surgery with A SIP OF WATER:  allopurinol , celexa , claritin , nebivolol , omeprazole   DO NOT TAKE ANY ORAL DIABETIC MEDICATIONS DAY OF YOUR SURGERY  Bring CPAP mask and tubing day of surgery.                              You may not have any metal on your body including hair pins, jewelry, and body piercing             Do not wear make-up, lotions, powders, perfumes/cologne, or deodorant  Do not wear nail polish including gel and S&S, artificial/acrylic nails, or any other type of covering on natural nails including finger and toenails. If you have artificial nails, gel coating, etc. that needs to be removed by a nail salon please have this removed prior to surgery or surgery may need to be canceled/ delayed if the surgeon/ anesthesia feels like they are unable to be safely monitored.   Do not shave  48 hours prior to surgery.               Men may shave face and neck.   Do not bring valuables to the hospital.  IS NOT             RESPONSIBLE   FOR VALUABLES.   Contacts, glasses, dentures or bridgework may not be worn into surgery.   Bring small overnight bag day of surgery.   DO NOT BRING YOUR HOME MEDICATIONS TO THE HOSPITAL. PHARMACY WILL DISPENSE MEDICATIONS LISTED ON YOUR MEDICATION LIST TO YOU DURING YOUR ADMISSION IN THE HOSPITAL!    Patients discharged on the day of surgery will not be allowed to drive home.  Someone NEEDS to stay with you for the first 24 hours after  anesthesia.   Special Instructions: Bring a copy of your healthcare power of attorney and living will documents the day of surgery if you haven't scanned them before.              Please read over the following fact sheets you were given: IF YOU HAVE QUESTIONS ABOUT YOUR PRE-OP INSTRUCTIONS PLEASE CALL [phone].   If you received a COVID test during your pre-op visit  it is requested that you wear a mask when out in public, stay away from anyone that may not be feeling well and notify your surgeon if you develop symptoms. If you test positive for Covid or have been in contact with anyone that has tested positive in the last 10 days please notify you surgeon.      Pre-operative 4 CHG Bath Instructions   You can play a key role in reducing the risk of infection after surgery. Your skin needs to be as free of germs as possible. You can reduce the number of germs on your skin by washing with CHG (chlorhexidine  gluconate) soap before surgery. CHG is an antiseptic soap that kills germs and continues to kill germs even after washing.   DO NOT use if you have an allergy to chlorhexidine /CHG or antibacterial soaps. If your skin becomes reddened or irritated, stop using the CHG and notify one of our RNs at [phone].   Please shower with the CHG soap starting 4 days before surgery using the following schedule:     Please  keep in mind the following:  DO NOT shave, including legs and underarms, starting the day of your first shower.   You may shave your face at any point before/day of surgery.  Place clean sheets on your bed the day you start using CHG soap. Use a clean washcloth (not used since being washed) for each shower. DO NOT sleep with pets once you start using the CHG.   CHG Shower Instructions:  If you choose to wash your hair and private area, wash first with your normal shampoo/soap.  After you use shampoo/soap, rinse your hair and body thoroughly to remove shampoo/soap residue.   Turn the water OFF and apply about 3 tablespoons (45 ml) of CHG soap to a CLEAN washcloth.  Apply CHG soap ONLY FROM YOUR NECK DOWN TO YOUR TOES (washing for 3-5 minutes)  DO NOT use CHG soap on face, private areas, open wounds, or sores.  Pay special attention to the area where your surgery is being performed.  If you are having back surgery, having someone wash your back for you may be helpful. Wait 2 minutes after CHG soap is applied, then you may rinse off the CHG soap.  Pat dry with a clean towel  Put on clean clothes/pajamas   If you choose to wear lotion, please use ONLY the CHG-compatible lotions on the back of this paper.     Additional instructions for the day of surgery: DO NOT APPLY any lotions, deodorants, cologne, or perfumes.   Put on clean/comfortable clothes.  Brush your teeth.  Ask your nurse before applying any prescription medications to the skin.      CHG Compatible Lotions   Aveeno Moisturizing lotion  Cetaphil Moisturizing Cream  Cetaphil Moisturizing Lotion  Clairol Herbal Essence Moisturizing Lotion, Dry Skin  Clairol Herbal Essence Moisturizing Lotion, Extra Dry Skin  Clairol Herbal Essence Moisturizing Lotion, Normal Skin  Curel Age Defying Therapeutic Moisturizing Lotion with Alpha Hydroxy  Curel Extreme Care Body Lotion  Curel Soothing Hands Moisturizing Hand Lotion  Curel Therapeutic Moisturizing Cream, Fragrance-Free  Curel Therapeutic Moisturizing Lotion, Fragrance-Free  Curel Therapeutic Moisturizing Lotion, Original Formula  Eucerin Daily Replenishing Lotion  Eucerin Dry Skin Therapy Plus Alpha Hydroxy Crme  Eucerin Dry Skin Therapy Plus Alpha Hydroxy Lotion  Eucerin Original Crme  Eucerin Original Lotion  Eucerin Plus Crme Eucerin Plus Lotion  Eucerin TriLipid Replenishing Lotion  Keri Anti-Bacterial Hand Lotion  Keri Deep Conditioning Original Lotion Dry Skin Formula Softly Scented  Keri Deep Conditioning Original Lotion, Fragrance  Free Sensitive Skin Formula  Keri Lotion Fast Absorbing Fragrance Free Sensitive Skin Formula  Keri Lotion Fast Absorbing Softly Scented Dry Skin Formula  Keri Original Lotion  Keri Skin Renewal Lotion Keri Silky Smooth Lotion  Keri Silky Smooth Sensitive Skin Lotion  Nivea Body Creamy Conditioning Oil  Nivea Body Extra Enriched Teacher, Adult Education Moisturizing Lotion Nivea Crme  Nivea Skin Firming Lotion  NutraDerm 30 Skin Lotion  NutraDerm Skin Lotion  NutraDerm Therapeutic Skin Cream  NutraDerm Therapeutic Skin Lotion  ProShield Protective Hand Cream  Provon moisturizing lotion

## 2024-10-24 ENCOUNTER — Other Ambulatory Visit: Payer: Self-pay

## 2024-10-24 ENCOUNTER — Encounter: Payer: Self-pay | Admitting: Cardiology

## 2024-10-24 ENCOUNTER — Encounter (HOSPITAL_COMMUNITY): Payer: Self-pay

## 2024-10-24 ENCOUNTER — Encounter (HOSPITAL_COMMUNITY)
Admission: RE | Admit: 2024-10-24 | Discharge: 2024-10-24 | Disposition: A | Discharge status: Home / Self Care | Source: Ambulatory Visit | Attending: Orthopedic Surgery | Admitting: Orthopedic Surgery

## 2024-10-24 VITALS — BP 100/58 | HR 66 | Temp 98.7°F | Resp 16 | Ht 63.0 in | Wt 162.0 lb

## 2024-10-24 DIAGNOSIS — Z950 Presence of cardiac pacemaker: Secondary | ICD-10-CM | POA: Insufficient documentation

## 2024-10-24 DIAGNOSIS — Z853 Personal history of malignant neoplasm of breast: Secondary | ICD-10-CM | POA: Diagnosis not present

## 2024-10-24 DIAGNOSIS — M1711 Unilateral primary osteoarthritis, right knee: Secondary | ICD-10-CM | POA: Insufficient documentation

## 2024-10-24 DIAGNOSIS — I495 Sick sinus syndrome: Secondary | ICD-10-CM | POA: Diagnosis not present

## 2024-10-24 DIAGNOSIS — Z87891 Personal history of nicotine dependence: Secondary | ICD-10-CM | POA: Insufficient documentation

## 2024-10-24 DIAGNOSIS — R0609 Other forms of dyspnea: Secondary | ICD-10-CM | POA: Diagnosis not present

## 2024-10-24 DIAGNOSIS — Z96652 Presence of left artificial knee joint: Secondary | ICD-10-CM | POA: Diagnosis not present

## 2024-10-24 DIAGNOSIS — I081 Rheumatic disorders of both mitral and tricuspid valves: Secondary | ICD-10-CM | POA: Diagnosis not present

## 2024-10-24 DIAGNOSIS — K760 Fatty (change of) liver, not elsewhere classified: Secondary | ICD-10-CM | POA: Insufficient documentation

## 2024-10-24 DIAGNOSIS — I10 Essential (primary) hypertension: Secondary | ICD-10-CM | POA: Insufficient documentation

## 2024-10-24 DIAGNOSIS — Z01812 Encounter for preprocedural laboratory examination: Secondary | ICD-10-CM | POA: Diagnosis present

## 2024-10-24 DIAGNOSIS — Z01818 Encounter for other preprocedural examination: Secondary | ICD-10-CM

## 2024-10-24 DIAGNOSIS — I700 Atherosclerosis of aorta: Secondary | ICD-10-CM | POA: Insufficient documentation

## 2024-10-24 HISTORY — DX: Anemia, unspecified: D64.9

## 2024-10-24 LAB — BASIC METABOLIC PANEL WITH GFR
Anion gap: 11 (ref 5–15)
BUN: 33 mg/dL — ABNORMAL HIGH (ref 8–23)
CO2: 23 mmol/L (ref 22–32)
Calcium: 9.1 mg/dL (ref 8.9–10.3)
Chloride: 109 mmol/L (ref 98–111)
Creatinine, Ser: 1.1 mg/dL — ABNORMAL HIGH (ref 0.44–1.00)
GFR, Estimated: 51 mL/min — ABNORMAL LOW
Glucose, Bld: 98 mg/dL (ref 70–99)
Potassium: 4.3 mmol/L (ref 3.5–5.1)
Sodium: 142 mmol/L (ref 135–145)

## 2024-10-24 LAB — CBC
HCT: 35.4 % — ABNORMAL LOW (ref 36.0–46.0)
Hemoglobin: 11.5 g/dL — ABNORMAL LOW (ref 12.0–15.0)
MCH: 33 pg (ref 26.0–34.0)
MCHC: 32.5 g/dL (ref 30.0–36.0)
MCV: 101.7 fL — ABNORMAL HIGH (ref 80.0–100.0)
Platelets: 202 K/uL (ref 150–400)
RBC: 3.48 MIL/uL — ABNORMAL LOW (ref 3.87–5.11)
RDW: 14.6 % (ref 11.5–15.5)
WBC: 6.9 K/uL (ref 4.0–10.5)
nRBC: 0 % (ref 0.0–0.2)

## 2024-10-24 LAB — SURGICAL PCR SCREEN
MRSA, PCR: NEGATIVE
Staphylococcus aureus: NEGATIVE

## 2024-10-24 NOTE — Progress Notes (Signed)
 PERIOPERATIVE PRESCRIPTION FOR IMPLANTED CARDIAC DEVICE PROGRAMMING  Patient Information: Name:  Judy Chapman  DOB:  1946/08/12  MRN:  [id number]    Planned Procedure: right total knee arthroplasty  Surgeon:  Dr  Dempsey Moan  Date of Procedure:  10/30/2024  Cautery will be used.  Position during surgery:   Please send documentation back to:  Darryle Law [phone] # [phone])  Device Information:  Clinic EP Physician:  Dr. Fonda Kitty   Device Type:  Pacemaker Manufacturer and Phone #:  Medtronic: [id number] Pacemaker Dependent?:  Yes.   Date of Last Device Check:  09/10/2024 Normal Device Function?:  Yes.    Electrophysiologist's Recommendations:  Have magnet available. Provide continuous ECG monitoring when magnet is used or reprogramming is to be performed.  Procedure should not interfere with device function.  No device programming or magnet placement needed.  Per Device Clinic Standing Orders, [name], RN  1:59 PM 10/24/2024

## 2024-10-25 NOTE — Anesthesia Preprocedure Evaluation (Addendum)
"                                    Anesthesia Evaluation  Patient identified by MRN, date of birth, ID band Patient awake    Reviewed: Allergy & Precautions, NPO status , Patient's Chart, lab work & pertinent test results, reviewed documented beta blocker date and time   History of Anesthesia Complications Negative for: history of anesthetic complications  Airway Mallampati: III  TM Distance: >3 FB     Dental no notable dental hx.    Pulmonary asthma , pneumonia, former smoker Mild ILD   breath sounds clear to auscultation       Cardiovascular hypertension, (-) angina (-) CAD, (-) Past MI and (-) Cardiac Stents + dysrhythmias Atrial Fibrillation + pacemaker + Valvular Problems/Murmurs MR and AI  Rhythm:Regular Rate:Normal  IMPRESSIONS     1. Left ventricular ejection fraction, by estimation, is 55 to 60%. The  left ventricle has normal function. The left ventricle has no regional  wall motion abnormalities. There is mild left ventricular hypertrophy.  Left ventricular diastolic parameters  are consistent with Grade I diastolic dysfunction (impaired relaxation).   2. Right ventricular systolic function is normal. The right ventricular  size is normal. Tricuspid regurgitation signal is inadequate for assessing  PA pressure.   3. The mitral valve is grossly normal. Mild to moderate mitral valve  regurgitation. No evidence of mitral stenosis.   4. Tricuspid valve regurgitation is mild to moderate.   5. The aortic valve is tricuspid. Aortic valve regurgitation is moderate,  PHT 392 ms, vena contracta 0.46 cm. Central jet of AI. No aortic stenosis  is present.   6. The inferior vena cava is normal in size with greater than 50%  respiratory variability, suggesting right atrial pressure of 3 mmHg.      Neuro/Psych neg Seizures    GI/Hepatic ,neg GERD  ,,(+) neg Cirrhosis        Endo/Other    Renal/GU Renal InsufficiencyRenal disease      Musculoskeletal  (+) Arthritis , Osteoarthritis,    Abdominal   Peds  Hematology  (+) Blood dyscrasia, anemia   Anesthesia Other Findings   Reproductive/Obstetrics                              Anesthesia Physical Anesthesia Plan  ASA: 3  Anesthesia Plan: Spinal and MAC   Post-op Pain Management: Regional block*   Induction: Intravenous  PONV Risk Score and Plan: 2 and Ondansetron  and Propofol  infusion  Airway Management Planned: Natural Airway and Simple Face Mask  Additional Equipment:   Intra-op Plan:   Post-operative Plan:   Informed Consent: I have reviewed the patients History and Physical, chart, labs and discussed the procedure including the risks, benefits and alternatives for the proposed anesthesia with the patient or authorized representative who has indicated his/her understanding and acceptance.     Dental advisory given  Plan Discussed with: CRNA  Anesthesia Plan Comments: (See PAT note from 12/30)         Anesthesia Quick Evaluation  "

## 2024-10-25 NOTE — Progress Notes (Signed)
 " Case: [id number] Date/Time: 10/30/24 1325   Procedure: ARTHROPLASTY, KNEE, TOTAL (Right: Knee)   Anesthesia type: Choice   Pre-op diagnosis: Right knee osteoarthritis   Location: [facility] ROOM 09 / [facility]   Surgeons: Melodi Lerner, MD       DISCUSSION: Judy Chapman is an 78 yo female with PMH of former smoking, HTN, SSS s/p PPM, PAF not on anticoagulation, mild-mod MR, mod AI, asthma, ILD, hx of breast cancer, anemia, arthritis.  Patient follows with Pulmonology for hx of mild ILD. Has chronic DOE. On inhalers. Last seen on 05/31/24 by Dr. Shelah. Advised 1 year f/u.  Patient with hx of SSS s/p Medtronic PPM in 2002. Had generator change out in 01/2024. Also with hx of subclinical A.fib but not anticoagulated. Follows with Cardiology. Last seen in clinic on 05/01/24. Noted to be doing well and advised f/u in 1 year. She had a cardiac clearance tele visit on 10/06/24 and was cleared:  Preoperative Cardiovascular Risk Assessment: According to the Revised Cardiac Risk Index (RCRI), her Perioperative Risk of Major Cardiac Event is (%): 0.4. Her Functional Capacity in METs is: 5.07 according to the Duke Activity Status Index (DASI). Therefore, based on ACC/AHA guidelines, patient would be at acceptable risk for the planned procedure without further cardiovascular testing. I will route this recommendation to the requesting party via Epic fax function.   Device orders:   Device Type:  Pacemaker Manufacturer and Phone #:  Medtronic: [phone] Pacemaker Dependent?:  Yes.   Date of Last Device Check:  09/10/2024 Normal Device Function?:  Yes.     Electrophysiologist's Recommendations:   Have magnet available. Provide continuous ECG monitoring when magnet is used or reprogramming is to be performed.  Procedure should not interfere with device function.  No device programming or magnet placement needed.     VS: BP (!) 100/58   Pulse 66   Temp 37.1 C (Oral)   Resp 16   Ht 5' 3 (1.6 m)   Wt  73.5 kg   SpO2 100%   BMI 28.70 kg/m   PROVIDERS: Shayne Anes, MD   LABS: Labs reviewed: Acceptable for surgery. (all labs ordered are listed, but only abnormal results are displayed)  Labs Reviewed  CBC - Abnormal; Notable for the following components:      Result Value   RBC 3.48 (*)    Hemoglobin 11.5 (*)    HCT 35.4 (*)    MCV 101.7 (*)    All other components within normal limits  BASIC METABOLIC PANEL WITH GFR - Abnormal; Notable for the following components:   BUN 33 (*)    Creatinine, Ser 1.10 (*)    GFR, Estimated 51 (*)    All other components within normal limits  SURGICAL PCR SCREEN     CT Chest 06/11/23:  IMPRESSION: 1. Mild basilar predominant subpleural reticular densities, ground-glass and traction bronchiolectasis, unchanged from 06/15/2022. Findings are categorized as probable UIP per consensus guidelines: Diagnosis of Idiopathic Pulmonary Fibrosis: An Official ATS/ERS/JRS/ALAT Clinical Practice Guideline. Am JINNY Honey Crit Care Med Vol 198, Iss 5, [id number], Jun 26 2017. 2. Hepatic steatosis. 3.  Aortic atherosclerosis (ICD10-I70.0).      EKG 12/31/23:  Ventricular paced rhythm   04/03/22: TTE 1. Left ventricular ejection fraction, by estimation, is 55 to 60%. The  left ventricle has normal function. The left ventricle has no regional  wall motion abnormalities. There is mild left ventricular hypertrophy.  Left ventricular diastolic parameters  are consistent with Grade I  diastolic dysfunction (impaired relaxation).   2. Right ventricular systolic function is normal. The right ventricular  size is normal. Tricuspid regurgitation signal is inadequate for assessing  PA pressure.   3. The mitral valve is grossly normal. Mild to moderate mitral valve  regurgitation. No evidence of mitral stenosis.   4. Tricuspid valve regurgitation is mild to moderate.   5. The aortic valve is tricuspid. Aortic valve regurgitation is moderate,  PHT 392 ms, vena  contracta 0.46 cm. Central jet of AI. No aortic stenosis  is present.   6. The inferior vena cava is normal in size with greater than 50%  respiratory variability, suggesting right atrial pressure of 3 mmHg.   Past Medical History:  Diagnosis Date   ACE inhibitor intolerance    Anemia    Arthritis    Asthma    outgrew as a child   Breast cancer (HCC)    Cancer (HCC)    right breast   Dysrhythmia    SSS   Early cataract    Endometriosis    Gout    Heart disease    Hypertension    Interstitial lung disease (HCC)    Osteopenia    Pacemaker-MDT    DOI-2002, Generator change 2010   Pneumonia    PVC (premature ventricular contraction)    Sinus node dysfunction (HCC)    Wears glasses     Past Surgical History:  Procedure Laterality Date   BREAST LUMPECTOMY Right 2021   BREAST LUMPECTOMY WITH RADIOACTIVE SEED LOCALIZATION Right 03/27/2020   Procedure: RIGHT BREAST LUMPECTOMY WITH RADIOACTIVE SEED LOCALIZATION;  Surgeon: Curvin Deward MOULD, MD;  Location: [facility];  Service: General;  Laterality: Right;   CORONARY ANGIOPLASTY     DILATION AND CURETTAGE OF UTERUS     EYE SURGERY Bilateral    cataract extraction w/ IOL   GALLBLADDER SURGERY     HYSTEROSCOPY     PACEMAKER INSERTION     Medtronic   PPM GENERATOR CHANGEOUT N/A 01/26/2024   Procedure: PPM GENERATOR CHANGEOUT;  Surgeon: Fernande Elspeth BROCKS, MD;  Location: [facility] INVASIVE CV LAB;  Service: Cardiovascular;  Laterality: N/A;   REPLACEMENT TOTAL KNEE Left 11/08/2023   TONSILLECTOMY     TOTAL KNEE ARTHROPLASTY Left 11/08/2023   Procedure: TOTAL KNEE ARTHROPLASTY;  Surgeon: Melodi Lerner, MD;  Location: [facility];  Service: Orthopedics;  Laterality: Left;   TUBAL LIGATION     VAGINAL HYSTERECTOMY     WISDOM TOOTH EXTRACTION      MEDICATIONS:  allopurinol  (ZYLOPRIM ) 100 MG tablet   celecoxib  (CELEBREX ) 200 MG capsule   Cholecalciferol (VITAMIN D PO)   citalopram  (CELEXA ) 20 MG tablet   cyanocobalamin (VITAMIN B12) 1000 MCG  tablet   loratadine  (CLARITIN ) 10 MG tablet   MAGNESIUM PO   nebivolol  (BYSTOLIC ) 10 MG tablet   omeprazole (PRILOSEC) 20 MG capsule   Probiotic Product (PROBIOTIC PO)   tamoxifen  (NOLVADEX ) 10 MG tablet   No current facility-administered medications for this encounter.   Burnard CHRISTELLA Odis DEVONNA [facility] Short Stay/Anesthesiology [facility] Phone [phone] 10/25/2024 9:15 AM       "

## 2024-10-30 ENCOUNTER — Ambulatory Visit (HOSPITAL_COMMUNITY): Admitting: Anesthesiology

## 2024-10-30 ENCOUNTER — Observation Stay (HOSPITAL_COMMUNITY)
Admission: RE | Admit: 2024-10-30 | Discharge: 2024-10-31 | Disposition: A | Discharge status: Home / Self Care | Source: Ambulatory Visit | Attending: Orthopedic Surgery | Admitting: Orthopedic Surgery

## 2024-10-30 ENCOUNTER — Encounter (HOSPITAL_COMMUNITY): Payer: Self-pay | Admitting: Medical

## 2024-10-30 ENCOUNTER — Encounter (HOSPITAL_COMMUNITY): Admission: RE | Disposition: A | Payer: Self-pay | Source: Ambulatory Visit | Attending: Orthopedic Surgery

## 2024-10-30 ENCOUNTER — Encounter (HOSPITAL_COMMUNITY): Payer: Self-pay | Admitting: Orthopedic Surgery

## 2024-10-30 ENCOUNTER — Other Ambulatory Visit: Payer: Self-pay

## 2024-10-30 DIAGNOSIS — M1711 Unilateral primary osteoarthritis, right knee: Secondary | ICD-10-CM | POA: Diagnosis not present

## 2024-10-30 DIAGNOSIS — J45909 Unspecified asthma, uncomplicated: Secondary | ICD-10-CM

## 2024-10-30 DIAGNOSIS — I10 Essential (primary) hypertension: Secondary | ICD-10-CM | POA: Insufficient documentation

## 2024-10-30 DIAGNOSIS — Z853 Personal history of malignant neoplasm of breast: Secondary | ICD-10-CM | POA: Insufficient documentation

## 2024-10-30 DIAGNOSIS — Z87891 Personal history of nicotine dependence: Secondary | ICD-10-CM | POA: Diagnosis not present

## 2024-10-30 DIAGNOSIS — Z96652 Presence of left artificial knee joint: Secondary | ICD-10-CM | POA: Insufficient documentation

## 2024-10-30 DIAGNOSIS — Z01818 Encounter for other preprocedural examination: Secondary | ICD-10-CM

## 2024-10-30 DIAGNOSIS — M179 Osteoarthritis of knee, unspecified: Principal | ICD-10-CM | POA: Diagnosis present

## 2024-10-30 HISTORY — PX: TOTAL KNEE ARTHROPLASTY: SHX125

## 2024-10-30 SURGERY — ARTHROPLASTY, KNEE, TOTAL
Anesthesia: Monitor Anesthesia Care | Site: Knee | Laterality: Right

## 2024-10-30 MED ORDER — OXYCODONE HCL 5 MG/5ML PO SOLN: 5.0000 mg | Freq: Once | ORAL | Status: AC | PRN

## 2024-10-30 MED ORDER — ONDANSETRON HCL 4 MG PO TABS: 4.0000 mg | ORAL_TABLET | Freq: Four times a day (QID) | ORAL | Status: DC | PRN

## 2024-10-30 MED ORDER — SODIUM CHLORIDE 0.9 % IV SOLN
INTRAVENOUS | Status: DC | PRN
  Administered 2024-10-30: 80 mL

## 2024-10-30 MED ORDER — CEFAZOLIN SODIUM-DEXTROSE 2-4 GM/100ML-% IV SOLN
2.0000 g | Freq: Four times a day (QID) | INTRAVENOUS | Status: AC
  Administered 2024-10-30 – 2024-10-31 (×2): 2 g via INTRAVENOUS
  Filled 2024-10-30 (×2): qty 100

## 2024-10-30 MED ORDER — DEXAMETHASONE SOD PHOSPHATE PF 10 MG/ML IJ SOLN
10.0000 mg | Freq: Once | INTRAMUSCULAR | Status: AC
  Administered 2024-10-31: 10 mg via INTRAVENOUS

## 2024-10-30 MED ORDER — METHOCARBAMOL 1000 MG/10ML IJ SOLN: 500.0000 mg | Freq: Four times a day (QID) | INTRAMUSCULAR | Status: DC | PRN

## 2024-10-30 MED ORDER — MENTHOL 3 MG MT LOZG: 1.0000 | LOZENGE | OROMUCOSAL | Status: DC | PRN

## 2024-10-30 MED ORDER — PHENOL 1.4 % MT LIQD: 1.0000 | OROMUCOSAL | Status: DC | PRN

## 2024-10-30 MED ORDER — SODIUM CHLORIDE 0.9 % IV SOLN: INTRAVENOUS | Status: DC

## 2024-10-30 MED ORDER — OXYCODONE HCL 5 MG PO TABS: 10.0000 mg | ORAL_TABLET | ORAL | Status: DC | PRN

## 2024-10-30 MED ORDER — ALLOPURINOL 100 MG PO TABS
200.0000 mg | ORAL_TABLET | Freq: Every day | ORAL | Status: DC
  Administered 2024-10-31: 200 mg via ORAL
  Filled 2024-10-30: qty 2

## 2024-10-30 MED ORDER — DOCUSATE SODIUM 100 MG PO CAPS
100.0000 mg | ORAL_CAPSULE | Freq: Two times a day (BID) | ORAL | Status: DC
  Administered 2024-10-30 – 2024-10-31 (×2): 100 mg via ORAL
  Filled 2024-10-30 (×2): qty 1

## 2024-10-30 MED ORDER — PHENYLEPHRINE 80 MCG/ML (10ML) SYRINGE FOR IV PUSH (FOR BLOOD PRESSURE SUPPORT)
PREFILLED_SYRINGE | INTRAVENOUS | Status: AC
  Filled 2024-10-30: qty 10

## 2024-10-30 MED ORDER — OXYCODONE HCL 5 MG PO TABS
5.0000 mg | ORAL_TABLET | Freq: Once | ORAL | Status: AC | PRN
  Administered 2024-10-30: 5 mg via ORAL

## 2024-10-30 MED ORDER — PROPOFOL 1000 MG/100ML IV EMUL
INTRAVENOUS | Status: AC
  Filled 2024-10-30: qty 100

## 2024-10-30 MED ORDER — FLEET ENEMA RE ENEM: 1.0000 | ENEMA | Freq: Once | RECTAL | Status: DC | PRN

## 2024-10-30 MED ORDER — METOCLOPRAMIDE HCL 5 MG/ML IJ SOLN: 5.0000 mg | Freq: Three times a day (TID) | INTRAMUSCULAR | Status: DC | PRN

## 2024-10-30 MED ORDER — SODIUM CHLORIDE 0.9 % IR SOLN
Status: DC | PRN
  Administered 2024-10-30: 1000 mL

## 2024-10-30 MED ORDER — HYDROMORPHONE HCL 1 MG/ML IJ SOLN: 0.5000 mg | INTRAMUSCULAR | Status: DC | PRN

## 2024-10-30 MED ORDER — CEFAZOLIN SODIUM-DEXTROSE 2-4 GM/100ML-% IV SOLN
2.0000 g | INTRAVENOUS | Status: AC
  Administered 2024-10-30: 2 g via INTRAVENOUS
  Filled 2024-10-30: qty 100

## 2024-10-30 MED ORDER — OXYCODONE HCL 5 MG PO TABS
5.0000 mg | ORAL_TABLET | ORAL | Status: DC | PRN
  Administered 2024-10-30 – 2024-10-31 (×2): 5 mg via ORAL
  Administered 2024-10-31: 10 mg via ORAL
  Administered 2024-10-31 (×2): 5 mg via ORAL
  Filled 2024-10-30: qty 2
  Filled 2024-10-30 (×4): qty 1

## 2024-10-30 MED ORDER — MEPIVACAINE HCL (PF) 2 % IJ SOLN
INTRAMUSCULAR | Status: DC | PRN
  Administered 2024-10-30: 3 mL via INTRATHECAL

## 2024-10-30 MED ORDER — PROPOFOL 10 MG/ML IV BOLUS
INTRAVENOUS | Status: AC
  Filled 2024-10-30: qty 20

## 2024-10-30 MED ORDER — POVIDONE-IODINE 10 % EX SWAB: 2.0000 | Freq: Once | CUTANEOUS | Status: DC

## 2024-10-30 MED ORDER — ORAL CARE MOUTH RINSE: 15.0000 mL | Freq: Once | OROMUCOSAL | Status: AC

## 2024-10-30 MED ORDER — TRANEXAMIC ACID-NACL 1000-0.7 MG/100ML-% IV SOLN
1000.0000 mg | INTRAVENOUS | Status: AC
  Administered 2024-10-30: 1000 mg via INTRAVENOUS
  Filled 2024-10-30: qty 100

## 2024-10-30 MED ORDER — DIPHENHYDRAMINE HCL 12.5 MG/5ML PO ELIX: 12.5000 mg | ORAL_SOLUTION | ORAL | Status: DC | PRN

## 2024-10-30 MED ORDER — ACETAMINOPHEN 325 MG PO TABS: 325.0000 mg | ORAL_TABLET | Freq: Four times a day (QID) | ORAL | Status: DC | PRN

## 2024-10-30 MED ORDER — FENTANYL CITRATE (PF) 50 MCG/ML IJ SOSY
50.0000 ug | PREFILLED_SYRINGE | INTRAMUSCULAR | Status: DC
  Administered 2024-10-30: 50 ug via INTRAVENOUS
  Filled 2024-10-30: qty 2

## 2024-10-30 MED ORDER — LACTATED RINGERS IV SOLN: INTRAVENOUS | Status: DC

## 2024-10-30 MED ORDER — ACETAMINOPHEN 10 MG/ML IV SOLN
1000.0000 mg | Freq: Four times a day (QID) | INTRAVENOUS | Status: DC
  Administered 2024-10-30: 1000 mg via INTRAVENOUS
  Filled 2024-10-30: qty 100

## 2024-10-30 MED ORDER — ONDANSETRON HCL 4 MG/2ML IJ SOLN
INTRAMUSCULAR | Status: AC
  Filled 2024-10-30: qty 2

## 2024-10-30 MED ORDER — ROPIVACAINE HCL 5 MG/ML IJ SOLN
INTRAMUSCULAR | Status: DC | PRN
  Administered 2024-10-30: 20 mL via PERINEURAL

## 2024-10-30 MED ORDER — ALBUMIN HUMAN 5 % IV SOLN
12.5000 g | Freq: Once | INTRAVENOUS | Status: AC
  Administered 2024-10-30: 12.5 g via INTRAVENOUS

## 2024-10-30 MED ORDER — ACETAMINOPHEN 500 MG PO TABS
1000.0000 mg | ORAL_TABLET | Freq: Four times a day (QID) | ORAL | Status: DC
  Administered 2024-10-30 – 2024-10-31 (×3): 1000 mg via ORAL
  Filled 2024-10-30 (×3): qty 2

## 2024-10-30 MED ORDER — LORATADINE 10 MG PO TABS
10.0000 mg | ORAL_TABLET | Freq: Every day | ORAL | Status: DC
  Administered 2024-10-31: 10 mg via ORAL
  Filled 2024-10-30: qty 1

## 2024-10-30 MED ORDER — BUPIVACAINE LIPOSOME 1.3 % IJ SUSP
INTRAMUSCULAR | Status: AC
  Filled 2024-10-30: qty 20

## 2024-10-30 MED ORDER — BISACODYL 10 MG RE SUPP: 10.0000 mg | Freq: Every day | RECTAL | Status: DC | PRN

## 2024-10-30 MED ORDER — DEXAMETHASONE SOD PHOSPHATE PF 10 MG/ML IJ SOLN
INTRAMUSCULAR | Status: DC | PRN
  Administered 2024-10-30: 10 mg via PERINEURAL

## 2024-10-30 MED ORDER — PANTOPRAZOLE SODIUM 40 MG PO TBEC
40.0000 mg | DELAYED_RELEASE_TABLET | Freq: Every day | ORAL | Status: DC
  Administered 2024-10-31: 40 mg via ORAL
  Filled 2024-10-30: qty 1

## 2024-10-30 MED ORDER — 0.9 % SODIUM CHLORIDE (POUR BTL) OPTIME
TOPICAL | Status: DC | PRN
  Administered 2024-10-30: 1000 mL

## 2024-10-30 MED ORDER — OXYCODONE HCL 5 MG PO TABS
ORAL_TABLET | ORAL | Status: AC
  Filled 2024-10-30: qty 1

## 2024-10-30 MED ORDER — ALBUMIN HUMAN 5 % IV SOLN
INTRAVENOUS | Status: AC
  Filled 2024-10-30: qty 250

## 2024-10-30 MED ORDER — METHOCARBAMOL 500 MG PO TABS
500.0000 mg | ORAL_TABLET | Freq: Four times a day (QID) | ORAL | Status: DC | PRN
  Administered 2024-10-30 – 2024-10-31 (×3): 500 mg via ORAL
  Filled 2024-10-30 (×3): qty 1

## 2024-10-30 MED ORDER — SODIUM CHLORIDE (PF) 0.9 % IJ SOLN
INTRAMUSCULAR | Status: AC
  Filled 2024-10-30: qty 10

## 2024-10-30 MED ORDER — ONDANSETRON HCL 4 MG/2ML IJ SOLN
INTRAMUSCULAR | Status: DC | PRN
  Administered 2024-10-30: 4 mg via INTRAVENOUS

## 2024-10-30 MED ORDER — PROPOFOL 500 MG/50ML IV EMUL
INTRAVENOUS | Status: DC | PRN
  Administered 2024-10-30: 20 mg via INTRAVENOUS
  Administered 2024-10-30: 100 ug/kg/min via INTRAVENOUS
  Administered 2024-10-30 (×2): 30 mg via INTRAVENOUS

## 2024-10-30 MED ORDER — POLYETHYLENE GLYCOL 3350 17 G PO PACK: 17.0000 g | PACK | Freq: Every day | ORAL | Status: DC | PRN

## 2024-10-30 MED ORDER — ORAL CARE MOUTH RINSE: 15.0000 mL | OROMUCOSAL | Status: DC | PRN

## 2024-10-30 MED ORDER — CITALOPRAM HYDROBROMIDE 20 MG PO TABS
20.0000 mg | ORAL_TABLET | Freq: Every day | ORAL | Status: DC
  Administered 2024-10-31: 20 mg via ORAL
  Filled 2024-10-30: qty 1

## 2024-10-30 MED ORDER — BUPIVACAINE LIPOSOME 1.3 % IJ SUSP: 20.0000 mL | Freq: Once | INTRAMUSCULAR | Status: AC

## 2024-10-30 MED ORDER — FENTANYL CITRATE (PF) 50 MCG/ML IJ SOSY: 25.0000 ug | PREFILLED_SYRINGE | INTRAMUSCULAR | Status: DC | PRN

## 2024-10-30 MED ORDER — SODIUM CHLORIDE (PF) 0.9 % IJ SOLN
INTRAMUSCULAR | Status: AC
  Filled 2024-10-30: qty 50

## 2024-10-30 MED ORDER — PHENYLEPHRINE 80 MCG/ML (10ML) SYRINGE FOR IV PUSH (FOR BLOOD PRESSURE SUPPORT)
PREFILLED_SYRINGE | INTRAVENOUS | Status: DC | PRN
  Administered 2024-10-30 (×4): 160 ug via INTRAVENOUS

## 2024-10-30 MED ORDER — NEBIVOLOL HCL 10 MG PO TABS
10.0000 mg | ORAL_TABLET | Freq: Every day | ORAL | Status: DC
  Filled 2024-10-30: qty 1

## 2024-10-30 MED ORDER — ONDANSETRON HCL 4 MG/2ML IJ SOLN: 4.0000 mg | Freq: Once | INTRAMUSCULAR | Status: DC | PRN

## 2024-10-30 MED ORDER — METOCLOPRAMIDE HCL 5 MG PO TABS: 5.0000 mg | ORAL_TABLET | Freq: Three times a day (TID) | ORAL | Status: DC | PRN

## 2024-10-30 MED ORDER — ONDANSETRON HCL 4 MG/2ML IJ SOLN: 4.0000 mg | Freq: Four times a day (QID) | INTRAMUSCULAR | Status: DC | PRN

## 2024-10-30 MED ORDER — RIVAROXABAN 10 MG PO TABS
10.0000 mg | ORAL_TABLET | Freq: Every day | ORAL | Status: DC
  Administered 2024-10-31: 10 mg via ORAL
  Filled 2024-10-30: qty 1

## 2024-10-30 MED ORDER — EPHEDRINE 5 MG/ML INJ
INTRAVENOUS | Status: AC
  Filled 2024-10-30: qty 5

## 2024-10-30 MED ORDER — DEXAMETHASONE SOD PHOSPHATE PF 10 MG/ML IJ SOLN: 8.0000 mg | Freq: Once | INTRAMUSCULAR | Status: AC

## 2024-10-30 MED ORDER — CHLORHEXIDINE GLUCONATE 0.12 % MT SOLN
15.0000 mL | Freq: Once | OROMUCOSAL | Status: AC
  Administered 2024-10-30: 15 mL via OROMUCOSAL

## 2024-10-30 MED ORDER — ACETAMINOPHEN 10 MG/ML IV SOLN: 1000.0000 mg | Freq: Once | INTRAVENOUS | Status: DC | PRN

## 2024-10-30 SURGICAL SUPPLY — 43 items
ATTUNE PSFEM RTSZ6 NARCEM KNEE (Femur) IMPLANT
ATTUNE PSRP INSR SZ6 8 KNEE (Insert) IMPLANT
BAG COUNTER SPONGE SURGICOUNT (BAG) IMPLANT
BAG ZIPLOCK 12X15 (MISCELLANEOUS) ×1 IMPLANT
BASE TIBIAL ROT PLAT SZ 5 KNEE (Knees) IMPLANT
BLADE SAG 18X100X1.27 (BLADE) ×1 IMPLANT
BLADE SAW SGTL 11.0X1.19X90.0M (BLADE) ×1 IMPLANT
BNDG ELASTIC 6INX 5YD STR LF (GAUZE/BANDAGES/DRESSINGS) ×1 IMPLANT
BNDG ELASTIC 6X10 VLCR STRL LF (GAUZE/BANDAGES/DRESSINGS) IMPLANT
BOWL SMART MIX CTS (DISPOSABLE) ×1 IMPLANT
CEMENT HV SMART SET (Cement) ×2 IMPLANT
COVER SURGICAL LIGHT HANDLE (MISCELLANEOUS) ×1 IMPLANT
CUFF TRNQT CYL 34X4.125X (TOURNIQUET CUFF) ×1 IMPLANT
DERMABOND ADVANCED .7 DNX12 (GAUZE/BANDAGES/DRESSINGS) ×1 IMPLANT
DRAPE U-SHAPE 47X51 STRL (DRAPES) ×1 IMPLANT
DRSG AQUACEL AG ADV 3.5X10 (GAUZE/BANDAGES/DRESSINGS) ×1 IMPLANT
DURAPREP 26ML APPLICATOR (WOUND CARE) ×1 IMPLANT
ELECT REM PT RETURN 15FT ADLT (MISCELLANEOUS) ×1 IMPLANT
GLOVE BIO SURGEON STRL SZ 6.5 (GLOVE) IMPLANT
GLOVE BIO SURGEON STRL SZ8 (GLOVE) ×1 IMPLANT
GLOVE BIOGEL PI IND STRL 7.0 (GLOVE) ×1 IMPLANT
GLOVE BIOGEL PI IND STRL 8 (GLOVE) ×1 IMPLANT
GOWN STRL REUS W/ TWL LRG LVL3 (GOWN DISPOSABLE) ×1 IMPLANT
HOLDER FOLEY CATH W/STRAP (MISCELLANEOUS) ×1 IMPLANT
IMMOBILIZER KNEE 20 THIGH 36 (SOFTGOODS) ×1 IMPLANT
KIT TURNOVER KIT A (KITS) ×1 IMPLANT
MANIFOLD NEPTUNE II (INSTRUMENTS) ×1 IMPLANT
NS IRRIG 1000ML POUR BTL (IV SOLUTION) ×1 IMPLANT
PACK TOTAL KNEE CUSTOM (KITS) ×1 IMPLANT
PADDING CAST COTTON 6X4 STRL (CAST SUPPLIES) ×2 IMPLANT
PATELLA MEDIAL ATTUN 35MM KNEE (Knees) IMPLANT
PENCIL SMOKE EVACUATOR (MISCELLANEOUS) ×1 IMPLANT
PIN STEINMAN FIXATION KNEE (PIN) IMPLANT
PROTECTOR NERVE ULNAR (MISCELLANEOUS) ×1 IMPLANT
SET HNDPC FAN SPRY TIP SCT (DISPOSABLE) ×1 IMPLANT
SUT MNCRL AB 4-0 PS2 18 (SUTURE) ×1 IMPLANT
SUT VIC AB 2-0 CT1 TAPERPNT 27 (SUTURE) ×3 IMPLANT
SUTURE STRATFX 0 PDS 27 VIOLET (SUTURE) ×1 IMPLANT
TOWEL GREEN STERILE FF (TOWEL DISPOSABLE) ×1 IMPLANT
TRAY FOLEY MTR SLVR 14FR STAT (SET/KITS/TRAYS/PACK) IMPLANT
TRAY FOLEY MTR SLVR 16FR STAT (SET/KITS/TRAYS/PACK) ×1 IMPLANT
TUBE SUCTION HIGH CAP CLEAR NV (SUCTIONS) ×1 IMPLANT
WRAP KNEE MAXI GEL POST OP (GAUZE/BANDAGES/DRESSINGS) ×1 IMPLANT

## 2024-10-30 NOTE — Interval H&P Note (Signed)
 History and Physical Interval Note:  10/30/2024 11:30 AM  Judy Chapman  has presented today for surgery, with the diagnosis of Right knee osteoarthritis.  The various methods of treatment have been discussed with the patient and family. After consideration of risks, benefits and other options for treatment, the patient has consented to  Procedures: ARTHROPLASTY, KNEE, TOTAL (Right) as a surgical intervention.  The patient's history has been reviewed, patient examined, no change in status, stable for surgery.  I have reviewed the patient's chart and labs.  Questions were answered to the patient's satisfaction.     Judy Chapman

## 2024-10-30 NOTE — Anesthesia Procedure Notes (Addendum)
 Procedure Name: MAC Date/Time: 10/30/2024 1:21 PM  Performed by: Nada Corean CROME, CRNAPre-anesthesia Checklist: Patient identified, Emergency Drugs available, Suction available, Patient being monitored and Timeout performed Patient Re-evaluated:Patient Re-evaluated prior to induction Oxygen Delivery Method: Simple face mask Preoxygenation: Pre-oxygenation with 100% oxygen Induction Type: IV induction Ventilation: Nasal airway inserted- appropriate to patient size Placement Confirmation: positive ETCO2 Dental Injury: Teeth and Oropharynx as per pre-operative assessment

## 2024-10-30 NOTE — Care Plan (Signed)
 Ortho Bundle Case Management Note  Patient Details  Name: Judy Chapman MRN: [id number] Date of Birth: 1946/08/27  R TKA on 10/30/24  DCP: Home with husband  DME: No needs  PT: EO                DME Arranged:  N/A DME Agency:  NA  HH Arranged:    HH Agency:     Additional Comments: Please contact me with any questions of if this plan should need to change.  Lyle Pepper, CCM [facility] [phone]  Ext. [phone]   10/30/2024, 12:18 PM

## 2024-10-30 NOTE — Op Note (Signed)
 "    OPERATIVE REPORT-TOTAL KNEE ARTHROPLASTY   Pre-operative diagnosis- Osteoarthritis  Right knee(s)  Post-operative diagnosis- Osteoarthritis Right knee(s)  Procedure-  Right  Total Knee Arthroplasty  Surgeon- Dempsey GAILS. Ikhlas Albo, MD  Assistant- Waddell Sor, PA-C   Anesthesia-  Adductor canal block and spinal  EBL-25 ml   Drains None  Tourniquet time-  Total Tourniquet Time Documented: Thigh (Right) - 30 minutes Total: Thigh (Right) - 30 minutes     Complications- None  Condition-PACU - hemodynamically stable.   Brief Clinical Note  Judy Chapman is a 79 y.o. year old female with end stage OA of her right knee with progressively worsening pain and dysfunction. She has constant pain, with activity and at rest and significant functional deficits with difficulties even with ADLs. She has had extensive non-op management including analgesics, injections of cortisone and viscosupplements, and home exercise program, but remains in significant pain with significant dysfunction.Radiographs show bone on bone arthritis all 3 compartments. She presents now for right Total Knee Arthroplasty.     Procedure in detail---   The patient is brought into the operating room and positioned supine on the operating table. After successful administration of  adductor canal block and spinal,   a tourniquet is placed high on the  Right thigh(s) and the lower extremity is prepped and draped in the usual sterile fashion. Time out is performed by the operating team and then the  Right lower extremity is wrapped in Esmarch, knee flexed and the tourniquet inflated to 300 mmHg.       A midline incision is made with a ten blade through the subcutaneous tissue to the level of the extensor mechanism. A fresh blade is used to make a medial parapatellar arthrotomy. Soft tissue over the proximal medial tibia is subperiosteally elevated to the joint line with a knife and into the semimembranosus bursa with a Cobb  elevator. Soft tissue over the proximal lateral tibia is elevated with attention being paid to avoiding the patellar tendon on the tibial tubercle. The patella is everted, knee flexed 90 degrees and the ACL and PCL are removed. Findings are bone on bone all 3 compartments with large global osteophytes        The drill is used to create a starting hole in the distal femur and the canal is thoroughly irrigated with sterile saline to remove the fatty contents. The 5 degree Right  valgus alignment guide is placed into the femoral canal and the distal femoral cutting block is pinned to remove 10 mm off the distal femur. Resection is made with an oscillating saw.      The tibia is subluxed forward and the menisci are removed. The extramedullary alignment guide is placed referencing proximally at the medial aspect of the tibial tubercle and distally along the second metatarsal axis and tibial crest. The block is pinned to remove 2mm off the more deficient medial  side. Resection is made with an oscillating saw. Size 5is the most appropriate size for the tibia and the proximal tibia is prepared with the modular drill and keel punch for that size.      The femoral sizing guide is placed and size 6 is most appropriate. Rotation is marked off the epicondylar axis and confirmed by creating a rectangular flexion gap at 90 degrees. The size 6 cutting block is pinned in this rotation and the anterior, posterior and chamfer cuts are made with the oscillating saw. The intercondylar block is then placed and that  cut is made.      Trial size 5 tibial component, trial size 6 narrow posterior stabilized femur and a 8  mm posterior stabilized rotating platform insert trial is placed. Full extension is achieved with excellent varus/valgus and anterior/posterior balance throughout full range of motion. The patella is everted and thickness measured to be 22  mm. Free hand resection is taken to 12 mm, a 35 template is placed, lug holes  are drilled, trial patella is placed, and it tracks normally. Osteophytes are removed off the posterior femur with the trial in place. All trials are removed and the cut bone surfaces prepared with pulsatile lavage. Cement is mixed and once ready for implantation, the size 5 tibial implant, size  6 narrow posterior stabilized femoral component, and the size 35 patella are cemented in place and the patella is held with the clamp. The trial insert is placed and the knee held in full extension. The Exparel  (20 ml mixed with 60 ml saline) is injected into the extensor mechanism, posterior capsule, medial and lateral gutters and subcutaneous tissues.  All extruded cement is removed and once the cement is hard the permanent 8 mm posterior stabilized rotating platform insert is placed into the tibial tray.      The wound is copiously irrigated with saline solution and the extensor mechanism closed with # 0 Stratofix suture. The tourniquet is released for a total tourniquet time of 30  minutes. Flexion against gravity is 140 degrees and the patella tracks normally. Subcutaneous tissue is closed with 2.0 vicryl and subcuticular with running 4.0 Monocryl. The incision is cleaned and dried and steri-strips and a bulky sterile dressing are applied. The limb is placed into a knee immobilizer and the patient is awakened and transported to recovery in stable condition.      Please note that a surgical assistant was a medical necessity for this procedure in order to perform it in a safe and expeditious manner. Surgical assistant was necessary to retract the ligaments and vital neurovascular structures to prevent injury to them and also necessary for proper positioning of the limb to allow for anatomic placement of the prosthesis.   Dempsey ROCKFORD Serenitie Vinton, MD    10/30/2024, 2:23 PM   "

## 2024-10-30 NOTE — Discharge Instructions (Addendum)
 Frank Aluisio, MD Total Joint Specialist [facility] [address] [place], [place] [address] [phone]  TOTAL KNEE REPLACEMENT POSTOPERATIVE DIRECTIONS    Knee Rehabilitation, Guidelines Following Surgery  Results after knee surgery are often greatly improved when you follow the exercise, range of motion and muscle strengthening exercises prescribed by your doctor. Safety measures are also important to protect the knee from further injury. If any of these exercises cause you to have increased pain or swelling in your knee joint, decrease the amount until you are comfortable again and slowly increase them. If you have problems or questions, call your caregiver or physical therapist for advice.   BLOOD CLOT PREVENTION Take a 10 mg Xarelto once a day for three weeks following surgery. Then take an 81 mg Aspirin once a day for three weeks. Then discontinue Aspirin. You may resume your vitamins/supplements once you have discontinued the Xarelto. Do not take any NSAIDs (Advil, Aleve, Ibuprofen, Meloxicam, etc.) until you have discontinued the Xarelto.   HOME CARE INSTRUCTIONS  Remove items at home which could result in a fall. This includes throw rugs or furniture in walking pathways.  ICE to the affected knee as much as tolerated. Icing helps control swelling. If the swelling is well controlled you will be more comfortable and rehab easier. Continue to use ice on the knee for pain and swelling from surgery. You may notice swelling that will progress down to the foot and ankle. This is normal after surgery. Elevate the leg when you are not up walking on it.    Continue to use the breathing machine which will help keep your temperature down. It is common for your temperature to cycle up and down following surgery, especially at night when you are not up moving around and exerting yourself. The breathing machine keeps your lungs expanded and your temperature  down. Do not place pillow under the operative knee, focus on keeping the knee straight while resting  DIET You may resume your previous home diet once you are discharged from the hospital.  DRESSING / WOUND CARE / SHOWERING Keep your bulky bandage on for 2 days. On the third post-operative day you may remove the Ace bandage and gauze. There is a waterproof adhesive bandage on your skin which will stay in place until your first follow-up appointment. Once you remove this you will not need to place another bandage You may begin showering 3 days following surgery, but do not submerge the incision under water.  ACTIVITY For the first 5 days, the key is rest and control of pain and swelling Do your home exercises twice a day starting on post-operative day 3. On the days you go to physical therapy, just do the home exercises once that day. You should rest, ice and elevate the leg for 50 minutes out of every hour. Get up and walk/stretch for 10 minutes per hour. After 5 days you can increase your activity slowly as tolerated. Walk with your walker as instructed. Use the walker until you are comfortable transitioning to a cane. Walk with the cane in the opposite hand of the operative leg. You may discontinue the cane once you are comfortable and walking steadily. Avoid periods of inactivity such as sitting longer than an hour when not asleep. This helps prevent blood clots.  You may discontinue the knee immobilizer once you are able to perform a straight leg raise while lying down. You may resume a sexual relationship in one month or   when given the OK by your doctor.  You may return to work once you are cleared by your doctor.  Do not drive a car for 6 weeks or until released by your surgeon.  Do not drive while taking narcotics.  TED HOSE STOCKINGS Wear the elastic stockings on both legs for three weeks following surgery during the day. You may remove them at night for sleeping.  WEIGHT  BEARING Weight bearing as tolerated with assist device (walker, cane, etc) as directed, use it as long as suggested by your surgeon or therapist, typically at least 4-6 weeks.  POSTOPERATIVE CONSTIPATION PROTOCOL Constipation - defined medically as fewer than three stools per week and severe constipation as less than one stool per week.  One of the most common issues patients have following surgery is constipation.  Even if you have a regular bowel pattern at home, your normal regimen is likely to be disrupted due to multiple reasons following surgery.  Combination of anesthesia, postoperative narcotics, change in appetite and fluid intake all can affect your bowels.  In order to avoid complications following surgery, here are some recommendations in order to help you during your recovery period.  Colace (docusate) - Pick up an over-the-counter form of Colace or another stool softener and take twice a day as long as you are requiring postoperative pain medications.  Take with a full glass of water daily.  If you experience loose stools or diarrhea, hold the colace until you stool forms back up. If your symptoms do not get better within 1 week or if they get worse, check with your doctor. Dulcolax (bisacodyl) - Pick up over-the-counter and take as directed by the product packaging as needed to assist with the movement of your bowels.  Take with a full glass of water.  Use this product as needed if not relieved by Colace only.  MiraLax (polyethylene glycol) - Pick up over-the-counter to have on hand. MiraLax is a solution that will increase the amount of water in your bowels to assist with bowel movements.  Take as directed and can mix with a glass of water, juice, soda, coffee, or tea. Take if you go more than two days without a movement. Do not use MiraLax more than once per day. Call your doctor if you are still constipated or irregular after using this medication for 7 days in a row.  If you continue  to have problems with postoperative constipation, please contact the office for further assistance and recommendations.  If you experience "the worst abdominal pain ever" or develop nausea or vomiting, please contact the office immediatly for further recommendations for treatment.  ITCHING If you experience itching with your medications, try taking only a single pain pill, or even half a pain pill at a time.  You can also use Benadryl over the counter for itching or also to help with sleep.   MEDICATIONS See your medication summary on the "After Visit Summary" that the nursing staff will review with you prior to discharge.  You may have some home medications which will be placed on hold until you complete the course of blood thinner medication.  It is important for you to complete the blood thinner medication as prescribed by your surgeon.  Continue your approved medications as instructed at time of discharge.  PRECAUTIONS If you experience chest pain or shortness of breath - call 911 immediately for transfer to the hospital emergency department.  If you develop a fever greater that 101 F, purulent   drainage from wound, increased redness or drainage from wound, foul odor from the wound/dressing, or calf pain - CONTACT YOUR SURGEON.                                                   FOLLOW-UP APPOINTMENTS Make sure you keep all of your appointments after your operation with your surgeon and caregivers. You should call the office at the above phone number and make an appointment for approximately two weeks after the date of your surgery or on the date instructed by your surgeon outlined in the "After Visit Summary".  RANGE OF MOTION AND STRENGTHENING EXERCISES  Rehabilitation of the knee is important following a knee injury or an operation. After just a few days of immobilization, the muscles of the thigh which control the knee become weakened and shrink (atrophy). Knee exercises are designed to build up  the tone and strength of the thigh muscles and to improve knee motion. Often times heat used for twenty to thirty minutes before working out will loosen up your tissues and help with improving the range of motion but do not use heat for the first two weeks following surgery. These exercises can be done on a training (exercise) mat, on the floor, on a table or on a bed. Use what ever works the best and is most comfortable for you Knee exercises include:  Leg Lifts - While your knee is still immobilized in a splint or cast, you can do straight leg raises. Lift the leg to 60 degrees, hold for 3 sec, and slowly lower the leg. Repeat 10-20 times 2-3 times daily. Perform this exercise against resistance later as your knee gets better.  Quad and Hamstring Sets - Tighten up the muscle on the front of the thigh (Quad) and hold for 5-10 sec. Repeat this 10-20 times hourly. Hamstring sets are done by pushing the foot backward against an object and holding for 5-10 sec. Repeat as with quad sets.  Leg Slides: Lying on your back, slowly slide your foot toward your buttocks, bending your knee up off the floor (only go as far as is comfortable). Then slowly slide your foot back down until your leg is flat on the floor again. Angel Wings: Lying on your back spread your legs to the side as far apart as you can without causing discomfort.  A rehabilitation program following serious knee injuries can speed recovery and prevent re-injury in the future due to weakened muscles. Contact your doctor or a physical therapist for more information on knee rehabilitation.   POST-OPERATIVE OPIOID TAPER INSTRUCTIONS: It is important to wean off of your opioid medication as soon as possible. If you do not need pain medication after your surgery it is ok to stop day one. Opioids include: Codeine, Hydrocodone(Norco, Vicodin), Oxycodone(Percocet, oxycontin) and hydromorphone amongst others.  Long term and even short term use of opiods can  cause: Increased pain response Dependence Constipation Depression Respiratory depression And more.  Withdrawal symptoms can include Flu like symptoms Nausea, vomiting And more Techniques to manage these symptoms Hydrate well Eat regular healthy meals Stay active Use relaxation techniques(deep breathing, meditating, yoga) Do Not substitute Alcohol to help with tapering If you have been on opioids for less than two weeks and do not have pain than it is ok to stop all together.  Plan to   wean off of opioids This plan should start within one week post op of your joint replacement. Maintain the same interval or time between taking each dose and first decrease the dose.  Cut the total daily intake of opioids by one tablet each day Next start to increase the time between doses. The last dose that should be eliminated is the evening dose.   IF YOU ARE TRANSFERRED TO A SKILLED REHAB FACILITY If the patient is transferred to a skilled rehab facility following release from the hospital, a list of the current medications will be sent to the facility for the patient to continue.  When discharged from the skilled rehab facility, please have the facility set up the patient's Home Health Physical Therapy prior to being released. Also, the skilled facility will be responsible for providing the patient with their medications at time of release from the facility to include their pain medication, the muscle relaxants, and their blood thinner medication. If the patient is still at the rehab facility at time of the two week follow up appointment, the skilled rehab facility will also need to assist the patient in arranging follow up appointment in our office and any transportation needs.  MAKE SURE YOU:  Understand these instructions.  Get help right away if you are not doing well or get worse.   DENTAL ANTIBIOTICS:  In most cases prophylactic antibiotics for Dental procdeures after total joint surgery are  not necessary.  Exceptions are as follows:  1. History of prior total joint infection  2. Severely immunocompromised (Organ Transplant, cancer chemotherapy, Rheumatoid biologic meds such as Humera)  3. Poorly controlled diabetes (A1C &gt; 8.0, blood glucose over 200)  If you have one of these conditions, contact your surgeon for an antibiotic prescription, prior to your dental procedure.    Pick up stool softner and laxative for home use following surgery while on pain medications. Do not submerge incision under water. Please use good hand washing techniques while changing dressing each day. May shower starting three days after surgery. Please use a clean towel to pat the incision dry following showers. Continue to use ice for pain and swelling after surgery. Do not use any lotions or creams on the incision until instructed by your surgeon.  Information on my medicine - XARELTO (Rivaroxaban)  Why was Xarelto prescribed for you? Xarelto was prescribed for you to reduce the risk of blood clots forming after orthopedic surgery. The medical term for these abnormal blood clots is venous thromboembolism (VTE).  What do you need to know about xarelto ? Take your Xarelto ONCE DAILY at the same time every day. You may take it either with or without food.  If you have difficulty swallowing the tablet whole, you may crush it and mix in applesauce just prior to taking your dose.  Take Xarelto exactly as prescribed by your doctor and DO NOT stop taking Xarelto without talking to the doctor who prescribed the medication.  Stopping without other VTE prevention medication to take the place of Xarelto may increase your risk of developing a clot.  After discharge, you should have regular check-up appointments with your healthcare provider that is prescribing your Xarelto.    What do you do if you miss a dose? If you miss a dose, take it as soon as you remember on the same day then  continue your regularly scheduled once daily regimen the next day. Do not take two doses of Xarelto on the same day.   Important   Safety Information A possible side effect of Xarelto is bleeding. You should call your healthcare provider right away if you experience any of the following: Bleeding from an injury or your nose that does not stop. Unusual colored urine (red or dark brown) or unusual colored stools (red or black). Unusual bruising for unknown reasons. A serious fall or if you hit your head (even if there is no bleeding).  Some medicines may interact with Xarelto and might increase your risk of bleeding while on Xarelto. To help avoid this, consult your healthcare provider or pharmacist prior to using any new prescription or non-prescription medications, including herbals, vitamins, non-steroidal anti-inflammatory drugs (NSAIDs) and supplements.  This website has more information on Xarelto: www.xarelto.com.    

## 2024-10-30 NOTE — Transfer of Care (Signed)
 Immediate Anesthesia Transfer of Care Note  Patient: Judy Chapman  Procedure(s) Performed: ARTHROPLASTY, KNEE, TOTAL (Right: Knee)  Patient Location: PACU  Anesthesia Type:MAC, Regional, and Spinal  Level of Consciousness: awake, alert , oriented, and patient cooperative  Airway & Oxygen Therapy: Patient Spontanous Breathing and Patient connected to face mask oxygen  Post-op Assessment: Report given to RN and Post -op Vital signs reviewed and stable  Post vital signs: Reviewed and stable  Last Vitals:  Vitals Value Taken Time  BP 123/65 10/30/24 14:45  Temp    Pulse 61 10/30/24 14:46  Resp 23 10/30/24 14:46  SpO2 100 % 10/30/24 14:46  Vitals shown include unfiled device data.  Last Pain:  Vitals:   10/30/24 1210  TempSrc:   PainSc: 0-No pain         Complications: No notable events documented.

## 2024-10-30 NOTE — Anesthesia Procedure Notes (Signed)
 Anesthesia Regional Block: Adductor canal block   Pre-Anesthetic Checklist: , timeout performed,  Correct Patient, Correct Site, Correct Laterality,  Correct Procedure, Correct Position, site marked,  Risks and benefits discussed,  Surgical consent,  Pre-op  evaluation,  At surgeon's request and post-op pain management  Laterality: Right  Prep: Maximum Sterile Barrier Precautions used, chloraprep       Needles:  Injection technique: Single-shot  Needle Type: Echogenic Needle      Needle Gauge: 20     Additional Needles:   Procedures:,,,, ultrasound used (permanent image in chart),,    Narrative:  Start time: 10/30/2024 12:15 PM End time: 10/30/2024 12:20 PM Injection made incrementally with aspirations every 5 mL.  Performed by: Personally  Anesthesiologist: Keneth Lynwood POUR, MD

## 2024-10-30 NOTE — Progress Notes (Signed)
 Orthopedic Tech Progress Note Patient Details:  Judy Chapman 1945/12/02 [id number] Applied CPM per order. Will remove at 7:03 pm.  CPM Right Knee CPM Right Knee: On Right Knee Flexion (Degrees): 40 Right Knee Extension (Degrees): 10  Post Interventions Patient Tolerated: Well Instructions Provided: Adjustment of device, Care of device, Poper ambulation with device Ortho Devices Type of Ortho Device: CPM padding Ortho Device/Splint Location: RLE Ortho Device/Splint Interventions: Ordered, Application, Adjustment   Post Interventions Patient Tolerated: Well Instructions Provided: Adjustment of device, Care of device, Poper ambulation with device  Morna Pink 10/30/2024, 3:03 PM

## 2024-10-30 NOTE — Anesthesia Postprocedure Evaluation (Signed)
"   Anesthesia Post Note  Patient: Judy Chapman  Procedure(s) Performed: ARTHROPLASTY, KNEE, TOTAL (Right: Knee)     Patient location during evaluation: PACU Anesthesia Type: MAC and Spinal Level of consciousness: awake and alert Pain management: pain level controlled Vital Signs Assessment: post-procedure vital signs reviewed and stable Respiratory status: spontaneous breathing, nonlabored ventilation, respiratory function stable and patient connected to nasal cannula oxygen Cardiovascular status: blood pressure returned to baseline and stable Postop Assessment: no apparent nausea or vomiting Anesthetic complications: no   No notable events documented.  Last Vitals:  Vitals:   10/30/24 1515 10/30/24 1530  BP: (!) 100/55 (!) 102/59  Pulse: 62 60  Resp: (!) 21 17  Temp: 36.7 C   SpO2: 93% 91%    Last Pain:  Vitals:   10/30/24 1530  TempSrc:   PainSc: 0-No pain                 [name]      "

## 2024-10-30 NOTE — Anesthesia Procedure Notes (Signed)
 Spinal  Patient location during procedure: OR Start time: 10/30/2024 1:25 PM End time: 10/30/2024 1:28 PM Reason for block: surgical anesthesia  Staffing Performed: anesthesiologist  Authorized by: Keneth Lynwood POUR, MD   Performed by: Keneth Lynwood POUR, MD  Preanesthetic Checklist Completed: patient identified, IV checked, site marked, risks and benefits discussed, surgical consent, monitors and equipment checked, pre-op  evaluation and timeout performed Spinal Block Patient position: sitting Prep: DuraPrep Patient monitoring: heart rate, cardiac monitor, continuous pulse ox and blood pressure Approach: midline Location: L3-4 Injection technique: single-shot Needle Needle type: Sprotte  Needle gauge: 24 G Needle length: 9 cm Assessment Sensory level: T4 Events: CSF return

## 2024-10-31 ENCOUNTER — Other Ambulatory Visit (HOSPITAL_COMMUNITY): Payer: Self-pay

## 2024-10-31 ENCOUNTER — Telehealth (HOSPITAL_COMMUNITY): Payer: Self-pay | Admitting: Pharmacy Technician

## 2024-10-31 DIAGNOSIS — M1711 Unilateral primary osteoarthritis, right knee: Secondary | ICD-10-CM | POA: Diagnosis not present

## 2024-10-31 LAB — BASIC METABOLIC PANEL WITH GFR
Anion gap: 9 (ref 5–15)
BUN: 26 mg/dL — ABNORMAL HIGH (ref 8–23)
CO2: 21 mmol/L — ABNORMAL LOW (ref 22–32)
Calcium: 7.9 mg/dL — ABNORMAL LOW (ref 8.9–10.3)
Chloride: 109 mmol/L (ref 98–111)
Creatinine, Ser: 1.03 mg/dL — ABNORMAL HIGH (ref 0.44–1.00)
GFR, Estimated: 55 mL/min — ABNORMAL LOW
Glucose, Bld: 142 mg/dL — ABNORMAL HIGH (ref 70–99)
Potassium: 3.9 mmol/L (ref 3.5–5.1)
Sodium: 139 mmol/L (ref 135–145)

## 2024-10-31 LAB — CBC
HCT: 28.4 % — ABNORMAL LOW (ref 36.0–46.0)
Hemoglobin: 9.3 g/dL — ABNORMAL LOW (ref 12.0–15.0)
MCH: 32.5 pg (ref 26.0–34.0)
MCHC: 32.7 g/dL (ref 30.0–36.0)
MCV: 99.3 fL (ref 80.0–100.0)
Platelets: 139 K/uL — ABNORMAL LOW (ref 150–400)
RBC: 2.86 MIL/uL — ABNORMAL LOW (ref 3.87–5.11)
RDW: 13.9 % (ref 11.5–15.5)
WBC: 12.5 K/uL — ABNORMAL HIGH (ref 4.0–10.5)
nRBC: 0 % (ref 0.0–0.2)

## 2024-10-31 MED ORDER — SODIUM CHLORIDE 0.9 % IV BOLUS
250.0000 mL | Freq: Once | INTRAVENOUS | Status: AC
  Administered 2024-10-31: 250 mL via INTRAVENOUS

## 2024-10-31 MED ORDER — ONDANSETRON HCL 4 MG PO TABS: 4.0000 mg | ORAL_TABLET | Freq: Four times a day (QID) | ORAL | 0 refills | Status: AC | PRN

## 2024-10-31 MED ORDER — OXYCODONE HCL 5 MG PO TABS: 5.0000 mg | ORAL_TABLET | ORAL | 0 refills | Status: AC | PRN

## 2024-10-31 MED ORDER — METHOCARBAMOL 500 MG PO TABS: 500.0000 mg | ORAL_TABLET | Freq: Four times a day (QID) | ORAL | 0 refills | Status: AC | PRN

## 2024-10-31 MED ORDER — RIVAROXABAN 10 MG PO TABS: 10.0000 mg | ORAL_TABLET | Freq: Every day | ORAL | 0 refills | Status: AC

## 2024-10-31 NOTE — Progress Notes (Signed)
 Physical Therapy Treatment Patient Details Name: Judy Chapman MRN: [id number] DOB: 11-05-1945 Today's Date: 10/31/2024   History of Present Illness 79 yo female presents to therapy s/p R TKA on 10/30/2024 due to failure of conservative measures. PMH RA positive factor, ILD, R Ba Ca, sinoatrial node dysfunction, pacemaker, cough, endometriosis, OP, HTN, and gout.    PT Comments  Seen for second session for curb step practice for safe entry/exit to home and to kitchen. Completed curb step x2 with CGA with RW, good balance noted. Reviewed HEP and car transfer. Pt prepared for discharge home with support from spouse.     If plan is discharge home, recommend the following: A little help with walking and/or transfers;A little help with bathing/dressing/bathroom;Assist for transportation;Help with stairs or ramp for entrance   Can travel by private vehicle        Equipment Recommendations       Recommendations for Other Services       Precautions / Restrictions Precautions Precautions: Fall Recall of Precautions/Restrictions: Intact Restrictions Weight Bearing Restrictions Per Provider Order: No Other Position/Activity Restrictions: WBAT RLE     Mobility  Bed Mobility Overal bed mobility: Needs Assistance Bed Mobility: Supine to Sit     Supine to sit: Min assist     General bed mobility comments: seated in recliner upon PT arrival    Transfers Overall transfer level: Needs assistance Equipment used: Rolling walker (2 wheels) Transfers: Sit to/from Stand Sit to Stand: Supervision           General transfer comment: continues to require VC for hand placement and to bring RW as she backs up to sit    Ambulation/Gait Ambulation/Gait assistance: Contact guard assist Gait Distance (Feet): 50 Feet Assistive device: Rolling walker (2 wheels) Gait Pattern/deviations: Step-through pattern Gait velocity: decreased     General Gait Details: decreased stance time noted  on RLE, mild hip hiking during R swing phase which pt is able to correct with VC   Stairs Stairs: Yes Stairs assistance: Contact guard assist Stair Management: With walker Number of Stairs: 1 (x2) General stair comments: completed curb step with RW x2 at [facility] with VC for step sequencing, goood balance noted while completing   Wheelchair Mobility     Tilt Bed    Modified Rankin (Stroke Patients Only)       Balance Overall balance assessment: Needs assistance Sitting-balance support: No upper extremity supported, Feet supported Sitting balance-Leahy Scale: Good     Standing balance support: During functional activity, Reliant on assistive device for balance Standing balance-Leahy Scale: Fair                              Hotel Manager: No apparent difficulties  Cognition Arousal: Alert Behavior During Therapy: WFL for tasks assessed/performed   PT - Cognitive impairments: No apparent impairments                         Following commands: Intact      Cueing Cueing Techniques: Verbal cues  Exercises Total Joint Exercises Ankle Circles/Pumps: AROM, 10 reps, Both Quad Sets: AROM, Right, 5 reps Heel Slides: AAROM, Right, 5 reps Hip ABduction/ADduction: AAROM, Right, 5 reps Straight Leg Raises: AAROM, Right, 5 reps    General Comments        Pertinent Vitals/Pain Pain Assessment Pain Assessment: Faces Pain Score: 4  Faces Pain Scale: Hurts little more Pain  Location: R hip, incision site Pain Descriptors / Indicators: Aching, Discomfort, Dull, Grimacing Pain Intervention(s): Limited activity within patient's tolerance, Monitored during session, Premedicated before session, Repositioned    Home Living                          Prior Function            PT Goals (current goals can now be found in the care plan section) Acute Rehab PT Goals Patient Stated Goal: get back home PT Goal Formulation:  With patient Time For Goal Achievement: 11/14/24 Potential to Achieve Goals: Good Progress towards PT goals: Progressing toward goals    Frequency    7X/week      PT Plan      Co-evaluation              AM-PAC PT 6 Clicks Mobility   Outcome Measure  Help needed turning from your back to your side while in a flat bed without using bedrails?: A Little Help needed moving from lying on your back to sitting on the side of a flat bed without using bedrails?: A Little Help needed moving to and from a bed to a chair (including a wheelchair)?: A Little Help needed standing up from a chair using your arms (e.g., wheelchair or bedside chair)?: A Little Help needed to walk in hospital room?: A Little Help needed climbing 3-5 steps with a railing? : A Little 6 Click Score: 18    End of Session Equipment Utilized During Treatment: Gait belt Activity Tolerance: Patient tolerated treatment well;No increased pain Patient left: in chair;with call bell/phone within reach;with chair alarm set Nurse Communication: Mobility status PT Visit Diagnosis: Unsteadiness on feet (R26.81);Muscle weakness (generalized) (M62.81);Pain Pain - Right/Left: Right Pain - part of body: Knee     Time: 8667-8656 PT Time Calculation (min) (ACUTE ONLY): 11 min  Charges:    $Gait Training: 8-22 mins                       Isaiah DEL. Jesyca Weisenburger, PT, DPT   [employer] 10/31/2024, 2:22 PM

## 2024-10-31 NOTE — Progress Notes (Signed)
 "  Subjective: 1 Day Post-Op Procedures (LRB): ARTHROPLASTY, KNEE, TOTAL (Right) Patient reports pain as mild.   Patient seen in rounds by Dr. Melodi. Patient is well, and has had no acute complaints or problems No issues overnight. Denies chest pain, SOB, or calf pain. Foley catheter removed this AM.  We will begin therapy today  Objective: Vital signs in last 24 hours: Temp:  [97.9 F (36.6 C)-98.6 F (37 C)] 98.6 F (37 C) (01/06 0508) Pulse Rate:  [58-67] 67 (01/06 0508) Resp:  [11-24] 16 (01/06 0508) BP: (74-139)/(40-75) 104/58 (01/06 0508) SpO2:  [91 %-99 %] 99 % (01/06 0508) Weight:  [73 kg] 73 kg (01/05 1126)  Intake/Output from previous day:  Intake/Output Summary (Last 24 hours) at 10/31/2024 0848 Last data filed at 10/31/2024 0600 Gross per 24 hour  Intake 4129.74 ml  Output 2110 ml  Net 2019.74 ml     Intake/Output this shift: No intake/output data recorded.  Labs: Recent Labs    10/31/24 0315  HGB 9.3*   Recent Labs    10/31/24 0315  WBC 12.5*  RBC 2.86*  HCT 28.4*  PLT 139*   Recent Labs    10/31/24 0315  NA 139  K 3.9  CL 109  CO2 21*  BUN 26*  CREATININE 1.03*  GLUCOSE 142*  CALCIUM 7.9*   No results for input(s): LABPT, INR in the last 72 hours.  Exam: General - Patient is Alert and Oriented Extremity - Neurologically intact Neurovascular intact Sensation intact distally Dorsiflexion/Plantar flexion intact Dressing - dressing C/D/I Motor Function - intact, moving foot and toes well on exam.   Past Medical History:  Diagnosis Date   ACE inhibitor intolerance    Anemia    Arthritis    Asthma    outgrew as a child   Breast cancer (HCC)    Cancer (HCC)    right breast   Dysrhythmia    SSS   Early cataract    Endometriosis    Gout    Heart disease    Hypertension    Interstitial lung disease (HCC)    Osteopenia    Pacemaker-MDT    DOI-2002, Generator change 2010   Pneumonia    PVC (premature ventricular  contraction)    Sinus node dysfunction (HCC)    Wears glasses     Assessment/Plan: 1 Day Post-Op Procedures (LRB): ARTHROPLASTY, KNEE, TOTAL (Right) Principal Problem:   OA (osteoarthritis) of knee Active Problems:   Primary osteoarthritis of right knee  Estimated body mass index is 28.51 kg/m as calculated from the following:   Height as of this encounter: 5' 3 (1.6 m).   Weight as of this encounter: 73 kg. Advance diet Up with therapy D/C IV fluids   Patient's anticipated LOS is less than 2 midnights, meeting these requirements: - Lives within 1 hour of care - Has a competent adult at home to recover with post-op recover - NO history of  - Chronic pain requiring opioids  - Diabetes  - Coronary Artery Disease  - Heart failure  - Heart attack  - Stroke  - DVT/VTE  - Cardiac arrhythmia  - Respiratory Failure/COPD  - Renal failure  - Anemia  - Advanced Liver disease  DVT Prophylaxis - Xarelto  Weight bearing as tolerated. Continue therapy.  BP soft this morning, bolus ordered. Patient asymptomatic.   Plan is to go Home after hospital stay. Plan for discharge later today if progresses with therapy and meeting goals. Scheduled for OPPT at  [facility]. Follow-up in the office in 2 weeks.  The PDMP database was reviewed today prior to any opioid medications being prescribed to this patient.  Roxie Mess, PA-C Orthopedic Surgery [phone] 10/31/2024, 8:48 AM  "

## 2024-10-31 NOTE — TOC Transition Note (Signed)
 Transition of Care [facility]) - Discharge Note   Patient Details  Name: Judy Chapman MRN: [id number] Date of Birth: Mar 09, 1946  [facility] CM/SW Contact:  NORMAN ASPEN, LCSW Phone Number: 10/31/2024, 9:25 AM   Clinical Narrative:     Met with pt who confirms she has needed DME in the home.  OPPT already arranged with [facility].  No further IP CM needs.  Final next level of care: OP Rehab Barriers to Discharge: No Barriers Identified   Patient Goals and CMS Choice Patient states their goals for this hospitalization and ongoing recovery are:: return home          Discharge Placement                       Discharge Plan and Services Additional resources added to the After Visit Summary for                  DME Arranged: N/A DME Agency: NA                  Social Drivers of Health (SDOH) Interventions SDOH Screenings   Food Insecurity: No Food Insecurity (10/30/2024)  Housing: Low Risk (10/30/2024)  Transportation Needs: No Transportation Needs (10/30/2024)  Utilities: Not At Risk (10/30/2024)  Depression (PHQ2-9): Low Risk (06/08/2024)  Social Connections: Socially Integrated (10/30/2024)  Tobacco Use: Medium Risk (10/30/2024)     Readmission Risk Interventions     No data to display

## 2024-10-31 NOTE — Telephone Encounter (Signed)
 Patient Product/process Development Scientist completed.    The patient is insured through [employer]. Patient has Medicare and is not eligible for a copay card, but may be able to apply for patient assistance or Medicare RX Payment Plan (Patient Must reach out to their plan, if eligible for payment plan), if available.    Ran test claim for Xarelto  10 mg and the current 30 day co-pay is $40.00.   This test claim was processed through [facility]- copay amounts may vary at other pharmacies due to pharmacy/plan contracts, or as the patient moves through the different stages of their insurance plan.     Reyes Sharps, CPHT Pharmacy Technician Patient Advocate Specialist [facility] Patient Advocate Team Direct Number: [phone]  Fax: [phone]

## 2024-10-31 NOTE — Plan of Care (Signed)
  Problem: Pain Management: Goal: Pain level will decrease with appropriate interventions Outcome: Progressing   Problem: Clinical Measurements: Goal: Postoperative complications will be avoided or minimized Outcome: Progressing   Problem: Activity: Goal: Range of joint motion will improve Outcome: Progressing   Problem: Education: Goal: Knowledge of the prescribed therapeutic regimen will improve Outcome: Progressing   Problem: Safety: Goal: Ability to remain free from injury will improve Outcome: Progressing   Problem: Coping: Goal: Level of anxiety will decrease Outcome: Progressing

## 2024-10-31 NOTE — Evaluation (Signed)
 Physical Therapy Evaluation Patient Details Name: Judy Chapman MRN: [id number] DOB: Jan 17, 1946 Today's Date: 10/31/2024  History of Present Illness  79 yo female presents to therapy s/p R TKA on 10/30/2024 due to failure of conservative measures. PMH RA positive factor, ILD, R Ba Ca, sinoatrial node dysfunction, pacemaker, cough, endometriosis, OP, HTN, and gout.  Clinical Impression  Pt presented for therapy following R TKA, requires CGA-MIN A for bed mobility, transfers and ambulation up to 127ft. Prior to surgery, she was IND with all mobility without use of DME. She has 1 step to enter home, needs can be met on the first floor of her house, and her spouse is available to provide 24/7 assistance if needed. PT will return for PM session for stair practice and to review HEP. Follow physician's discharge recommendations for follow up therapy needs. No DME needs.         If plan is discharge home, recommend the following: A little help with walking and/or transfers;A little help with bathing/dressing/bathroom;Assist for transportation;Help with stairs or ramp for entrance   Can travel by private vehicle        Equipment Recommendations    Recommendations for Other Services       Functional Status Assessment Patient has had a recent decline in their functional status and demonstrates the ability to make significant improvements in function in a reasonable and predictable amount of time.     Precautions / Restrictions Precautions Precautions: Fall Recall of Precautions/Restrictions: Intact Restrictions Weight Bearing Restrictions Per Provider Order: No Other Position/Activity Restrictions: WBAT RLE      Mobility  Bed Mobility Overal bed mobility: Needs Assistance Bed Mobility: Supine to Sit     Supine to sit: Min assist     General bed mobility comments: assistance to bring RLE off EOB    Transfers Overall transfer level: Needs assistance Equipment used: Rolling  walker (2 wheels) Transfers: Sit to/from Stand Sit to Stand: Min assist           General transfer comment: VC for proper hand placement, backing up to recliner completely when moving to sit    Ambulation/Gait Ambulation/Gait assistance: Contact guard assist Gait Distance (Feet): 100 Feet Assistive device: Rolling walker (2 wheels) Gait Pattern/deviations: Step-through pattern Gait velocity: decreased     General Gait Details: decreased stance time noted on RLE, mild hip hiking during R swing phase which pt is able to correct with VC  Stairs Stairs:  (plan to assess in PM session)          Wheelchair Mobility     Tilt Bed    Modified Rankin (Stroke Patients Only)       Balance Overall balance assessment: Needs assistance Sitting-balance support: No upper extremity supported, Feet supported Sitting balance-Leahy Scale: Good     Standing balance support: During functional activity, Reliant on assistive device for balance Standing balance-Leahy Scale: Fair                               Pertinent Vitals/Pain Pain Assessment Pain Assessment: 0-10 Pain Score: 4  Pain Location: R hip, incision site Pain Descriptors / Indicators: Aching, Discomfort, Dull, Grimacing Pain Intervention(s): Limited activity within patient's tolerance, Monitored during session, Premedicated before session, Repositioned    Home Living Family/patient expects to be discharged to:: Private residence Living Arrangements: Spouse/significant other Available Help at Discharge: Family Type of Home: House Home Access: Stairs to enter Entrance Stairs-Rails: Right  Entrance Stairs-Number of Steps: 1 Alternate Level Stairs-Number of Steps: flight with primary bedroom/bathroom upstairs but her needs are able to be met on first floor; Step down to kitchen Home Layout: Two level;Able to live on main level with bedroom/bathroom Home Equipment: Rolling Walker (2 wheels);BSC/3in1;Shower  seat      Prior Function Prior Level of Function : Independent/Modified Independent;Driving             Mobility Comments: pain limited a lot of activity, ambulated without RW ADLs Comments: IND     Extremity/Trunk Assessment   Upper Extremity Assessment Upper Extremity Assessment: Overall WFL for tasks assessed    Lower Extremity Assessment Lower Extremity Assessment: Generalized weakness;RLE deficits/detail RLE Deficits / Details: decreased AROM knee limited to ~40degrees flexion, lacking 5-10 degres of extension. no N/T reported, MMT not fully tested d/t pain    Cervical / Trunk Assessment Cervical / Trunk Assessment: Normal  Communication   Communication Communication: No apparent difficulties    Cognition Arousal: Alert Behavior During Therapy: WFL for tasks assessed/performed   PT - Cognitive impairments: No apparent impairments                         Following commands: Intact       Cueing Cueing Techniques: Verbal cues     General Comments      Exercises Total Joint Exercises Ankle Circles/Pumps: AROM, 10 reps, Both Quad Sets: AROM, Right, 5 reps Heel Slides: AAROM, Right, 5 reps Hip ABduction/ADduction: AAROM, Right, 5 reps Straight Leg Raises: AAROM, Right, 5 reps   Assessment/Plan    PT Assessment Patient needs continued PT services  PT Problem List Decreased range of motion;Decreased strength;Decreased activity tolerance;Decreased balance       PT Treatment Interventions Gait training;Stair training;Functional mobility training;Therapeutic exercise;Therapeutic activities    PT Goals (Current goals can be found in the Care Plan section)  Acute Rehab PT Goals Patient Stated Goal: get back home PT Goal Formulation: With patient Time For Goal Achievement: 11/14/24 Potential to Achieve Goals: Good    Frequency 7X/week     Co-evaluation               AM-PAC PT 6 Clicks Mobility  Outcome Measure Help needed turning  from your back to your side while in a flat bed without using bedrails?: A Little Help needed moving from lying on your back to sitting on the side of a flat bed without using bedrails?: A Little Help needed moving to and from a bed to a chair (including a wheelchair)?: A Little Help needed standing up from a chair using your arms (e.g., wheelchair or bedside chair)?: A Little Help needed to walk in hospital room?: A Little Help needed climbing 3-5 steps with a railing? : A Lot 6 Click Score: 17    End of Session Equipment Utilized During Treatment: Gait belt Activity Tolerance: Patient tolerated treatment well;No increased pain Patient left: in chair;with call bell/phone within reach;with chair alarm set Nurse Communication: Mobility status PT Visit Diagnosis: Unsteadiness on feet (R26.81);Muscle weakness (generalized) (M62.81);Pain Pain - Right/Left: Right Pain - part of body: Knee    Time: 9048-8985 PT Time Calculation (min) (ACUTE ONLY): 23 min   Charges:   PT Evaluation $PT Eval Low Complexity: 1 Low PT Treatments $Gait Training: 8-22 mins           Isaiah DEL. Brevan Luberto, PT, DPT   [employer] 10/31/2024, 10:27 AM

## 2024-11-03 NOTE — Discharge Summary (Signed)
 Patient ID: Judy Chapman MRN: [id number] DOB/AGE: 02/13/1946 79 y.o.  Admit date: 10/30/2024 Discharge date: 10/31/2024  Admission Diagnoses:  Principal Problem:   OA (osteoarthritis) of knee Active Problems:   Primary osteoarthritis of right knee   Discharge Diagnoses:  Same  Past Medical History:  Diagnosis Date   ACE inhibitor intolerance    Anemia    Arthritis    Asthma    outgrew as a child   Breast cancer (HCC)    Cancer (HCC)    right breast   Dysrhythmia    SSS   Early cataract    Endometriosis    Gout    Heart disease    Hypertension    Interstitial lung disease (HCC)    Osteopenia    Pacemaker-MDT    DOI-2002, Generator change 2010   Pneumonia    PVC (premature ventricular contraction)    Sinus node dysfunction (HCC)    Wears glasses     Surgeries: Procedures: ARTHROPLASTY, KNEE, TOTAL on 10/30/2024   Consultants:   Discharged Condition: Improved  Hospital Course: Judy Chapman is an 79 y.o. female who was admitted 10/30/2024 for operative treatment ofOA (osteoarthritis) of knee. Patient has severe unremitting pain that affects sleep, daily activities, and work/hobbies. After pre-op  clearance the patient was taken to the operating room on 10/30/2024 and underwent  Procedures: ARTHROPLASTY, KNEE, TOTAL.    Patient was given perioperative antibiotics:  Anti-infectives (From admission, onward)    Start     Dose/Rate Route Frequency Ordered Stop   10/30/24 2000  ceFAZolin  (ANCEF ) IVPB 2g/100 mL premix        2 g 200 mL/hr over 30 Minutes Intravenous Every 6 hours 10/30/24 1646 10/31/24 0220   10/30/24 1230  ceFAZolin  (ANCEF ) IVPB 2g/100 mL premix        2 g 200 mL/hr over 30 Minutes Intravenous On call to O.R. 10/30/24 1115 10/30/24 1355        Patient was given sequential compression devices, early ambulation, and chemoprophylaxis to prevent DVT.  Patient benefited maximally from hospital stay and there were no complications.     Recent vital signs: No data found.   Recent laboratory studies: No results for input(s): WBC, HGB, HCT, PLT, NA, K, CL, CO2, BUN, CREATININE, GLUCOSE, INR, CALCIUM in the last 72 hours.  Invalid input(s): PT, 2   Discharge Medications:   Allergies as of 10/31/2024       Reactions   Augmentin [amoxicillin-pot Clavulanate] Diarrhea   Nitrofurantoin Other (See Comments)   Sweating and chills   Ramipril  Hives, Swelling   Other reaction(s): dr. Frutoso wanted it changed due to some allergy issues like swelling., Other (See Comments)   Thiazide-type Diuretics Other (See Comments)   Other reaction(s): stop due to gout   Uloric [febuxostat] Other (See Comments)   Other reaction(s): made her itch. urine smelled bad. felt better off of it. but could go back on it if needed she says   Anastrozole  Other (See Comments)   Other reaction(s): couldn't sleep, Other (See Comments)   Molnupiravir Rash   Other reaction(s): rash   Pravastatin Other (See Comments)   Other reaction(s): felt miserable on it.felt hot and sweaty in the night.        Medication List     STOP taking these medications    celecoxib  200 MG capsule Commonly known as: CELEBREX    cyanocobalamin 1000 MCG tablet Commonly known as: VITAMIN B12   MAGNESIUM PO   VITAMIN D PO  TAKE these medications    allopurinol  100 MG tablet Commonly known as: ZYLOPRIM  Take 200 mg by mouth daily.   citalopram  20 MG tablet Commonly known as: CELEXA  Take 1 tablet (20 mg total) by mouth daily.   loratadine  10 MG tablet Commonly known as: CLARITIN  Take 10 mg by mouth daily.   methocarbamol  500 MG tablet Commonly known as: ROBAXIN  Take 1 tablet (500 mg total) by mouth every 6 (six) hours as needed for muscle spasms.   nebivolol  10 MG tablet Commonly known as: BYSTOLIC  Take 10 mg by mouth daily.   omeprazole 20 MG capsule Commonly known as: PRILOSEC Take 20 mg by mouth daily.    ondansetron  4 MG tablet Commonly known as: ZOFRAN  Take 1 tablet (4 mg total) by mouth every 6 (six) hours as needed for nausea.   oxyCODONE  5 MG immediate release tablet Commonly known as: Oxy IR/ROXICODONE  Take 1 tablet (5 mg total) by mouth every 4 (four) hours as needed for moderate pain (pain score 4-6) or severe pain (pain score 7-10).   PROBIOTIC PO Take 1 capsule by mouth daily.   rivaroxaban  10 MG Tabs tablet Commonly known as: XARELTO  Take 1 tablet (10 mg total) by mouth daily with breakfast for 20 days. Then take one 81 mg aspirin  once a day for three weeks. Then discontinue aspirin .   tamoxifen  10 MG tablet Commonly known as: NOLVADEX  Take 1 tablet (10 mg total) by mouth daily.               Discharge Care Instructions  (From admission, onward)           Start     Ordered   10/31/24 0000  Weight bearing as tolerated        10/31/24 0850   10/31/24 0000  Change dressing       Comments: You may remove the bulky bandage (ACE wrap and gauze) two days after surgery. You will have an adhesive waterproof bandage underneath. Leave this in place until your first follow-up appointment.   10/31/24 0850            Diagnostic Studies: No results found.  Disposition: Discharge disposition: 01-Home or Self Care       Discharge Instructions     Call MD / Call 911   Complete by: As directed    If you experience chest pain or shortness of breath, CALL 911 and be transported to the hospital emergency room.  If you develope a fever above 101 F, pus (white drainage) or increased drainage or redness at the wound, or calf pain, call your surgeon's office.   Change dressing   Complete by: As directed    You may remove the bulky bandage (ACE wrap and gauze) two days after surgery. You will have an adhesive waterproof bandage underneath. Leave this in place until your first follow-up appointment.   Constipation Prevention   Complete by: As directed    Drink plenty  of fluids.  Prune juice may be helpful.  You may use a stool softener, such as Colace (over the counter) 100 mg twice a day.  Use MiraLax  (over the counter) for constipation as needed.   Diet - low sodium heart healthy   Complete by: As directed    Do not put a pillow under the knee. Place it under the heel.   Complete by: As directed    Driving restrictions   Complete by: As directed    No driving for two weeks  Post-operative opioid taper instructions:   Complete by: As directed    POST-OPERATIVE OPIOID TAPER INSTRUCTIONS: It is important to wean off of your opioid medication as soon as possible. If you do not need pain medication after your surgery it is ok to stop day one. Opioids include: Codeine, Hydrocodone (Norco, Vicodin), Oxycodone (Percocet, oxycontin ) and hydromorphone  amongst others.  Long term and even short term use of opiods can cause: Increased pain response Dependence Constipation Depression Respiratory depression And more.  Withdrawal symptoms can include Flu like symptoms Nausea, vomiting And more Techniques to manage these symptoms Hydrate well Eat regular healthy meals Stay active Use relaxation techniques(deep breathing, meditating, yoga) Do Not substitute Alcohol  to help with tapering If you have been on opioids for less than two weeks and do not have pain than it is ok to stop all together.  Plan to wean off of opioids This plan should start within one week post op of your joint replacement. Maintain the same interval or time between taking each dose and first decrease the dose.  Cut the total daily intake of opioids by one tablet each day Next start to increase the time between doses. The last dose that should be eliminated is the evening dose.      TED hose   Complete by: As directed    Use stockings (TED hose) for three weeks on both leg(s).  You may remove them at night for sleeping.   Weight bearing as tolerated   Complete by: As directed          Follow-up Information     Melodi Lerner, MD Follow up on 11/14/2024.   Specialty: Orthopedic Surgery Why: You are scheduled for a post op appointment on Tuesday 11/14/24 at 2:00pm Contact information: [address] [place] [address] [phone]         [facility] Go on 11/02/2024.   Why: You are scheduled for physical therapy Thursday 11/02/24 at 1:00pm; [address] Contact information: [address] [place] [address] [phone]                  Signed: Roxie Mess 11/03/2024, 1:38 PM

## 2024-11-08 ENCOUNTER — Encounter (HOSPITAL_COMMUNITY): Payer: Self-pay | Admitting: Orthopedic Surgery

## 2024-12-11 ENCOUNTER — Encounter

## 2025-03-12 ENCOUNTER — Encounter

## 2025-06-12 ENCOUNTER — Inpatient Hospital Stay: Admitting: Hematology and Oncology
